# Patient Record
Sex: Female | Born: 1980 | Race: Black or African American | Hispanic: No | Marital: Married | Smoking: Never smoker
Health system: Southern US, Community
[De-identification: ages and names within clinical notes are randomized; demographics above are authoritative.]

## PROBLEM LIST (undated history)

## (undated) ENCOUNTER — Inpatient Hospital Stay: Payer: Self-pay

## (undated) DIAGNOSIS — M797 Fibromyalgia: Secondary | ICD-10-CM

## (undated) DIAGNOSIS — J4520 Mild intermittent asthma, uncomplicated: Secondary | ICD-10-CM

## (undated) DIAGNOSIS — R030 Elevated blood-pressure reading, without diagnosis of hypertension: Secondary | ICD-10-CM

## (undated) DIAGNOSIS — O09529 Supervision of elderly multigravida, unspecified trimester: Secondary | ICD-10-CM

## (undated) DIAGNOSIS — G8929 Other chronic pain: Secondary | ICD-10-CM

## (undated) DIAGNOSIS — O009 Unspecified ectopic pregnancy without intrauterine pregnancy: Secondary | ICD-10-CM

## (undated) DIAGNOSIS — E559 Vitamin D deficiency, unspecified: Secondary | ICD-10-CM

## (undated) DIAGNOSIS — M1990 Unspecified osteoarthritis, unspecified site: Secondary | ICD-10-CM

## (undated) DIAGNOSIS — R7303 Prediabetes: Secondary | ICD-10-CM

## (undated) HISTORY — DX: Vitamin D deficiency, unspecified: E55.9

## (undated) HISTORY — DX: Unspecified ectopic pregnancy without intrauterine pregnancy: O00.90

## (undated) HISTORY — PX: ECTOPIC PREGNANCY SURGERY: SHX613

---

## 2006-05-08 DIAGNOSIS — O24419 Gestational diabetes mellitus in pregnancy, unspecified control: Secondary | ICD-10-CM

## 2012-06-11 ENCOUNTER — Emergency Department: Payer: Self-pay | Admitting: Emergency Medicine

## 2012-06-11 LAB — COMPREHENSIVE METABOLIC PANEL
Albumin: 3.5 g/dL (ref 3.4–5.0)
Alkaline Phosphatase: 70 U/L (ref 50–136)
BUN: 9 mg/dL (ref 7–18)
Calcium, Total: 8.2 mg/dL — ABNORMAL LOW (ref 8.5–10.1)
Co2: 27 mmol/L (ref 21–32)
Creatinine: 0.94 mg/dL (ref 0.60–1.30)
Osmolality: 276 (ref 275–301)
Potassium: 3.7 mmol/L (ref 3.5–5.1)
SGOT(AST): 20 U/L (ref 15–37)
SGPT (ALT): 20 U/L (ref 12–78)
Sodium: 139 mmol/L (ref 136–145)

## 2012-06-11 LAB — AMYLASE: Amylase: 84 U/L (ref 25–115)

## 2012-06-11 LAB — CBC
HCT: 40.2 % (ref 35.0–47.0)
HGB: 13.7 g/dL (ref 12.0–16.0)
MCH: 29.8 pg (ref 26.0–34.0)
MCHC: 34.2 g/dL (ref 32.0–36.0)
MCV: 87 fL (ref 80–100)

## 2012-06-11 LAB — URINALYSIS, COMPLETE
Bacteria: NONE SEEN
Bilirubin,UR: NEGATIVE
Ketone: NEGATIVE
Ph: 6 (ref 4.5–8.0)
Protein: NEGATIVE
Specific Gravity: 1.027 (ref 1.003–1.030)
Squamous Epithelial: 4

## 2012-06-11 LAB — LIPASE, BLOOD: Lipase: 190 U/L (ref 73–393)

## 2012-06-11 LAB — PREGNANCY, URINE: Pregnancy Test, Urine: NEGATIVE m[IU]/mL

## 2012-06-11 IMAGING — CT CT ABD-PELV W/ CM
1 of 2 series · 15 of 32 positions shown, 19 images · non-contrast
Comparison: none

REASON FOR EXAM: (1) upper pain and constipation; (2) pain
COMMENTS:   May transport without cardiac monitor

PROCEDURE:     CT  - CT ABDOMEN / PELVIS  W  - [DATE]  [DATE]
RESULT:     History: Pain.
Comparison Study: No prior.

[Series 2: 3mm soft tissue · axial · 0.71mm/px · z∈[-981,-519]mm · 15 of 168 slices shown, 19 images]
[im 7/168  soft-tissue]
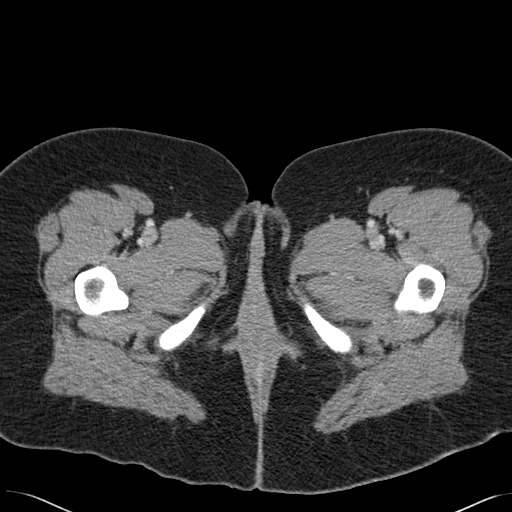
[im 7/168  bone]
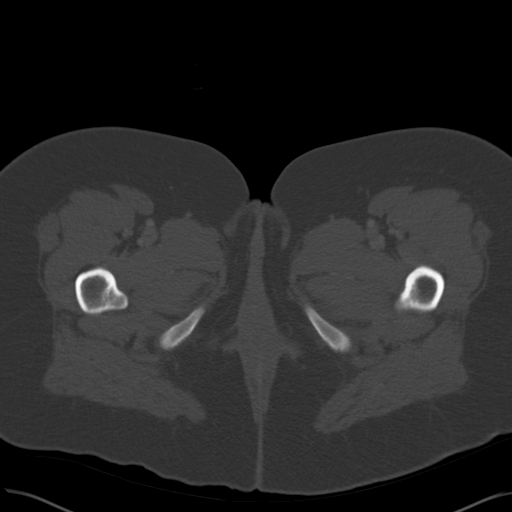
[im 20/168  soft-tissue]
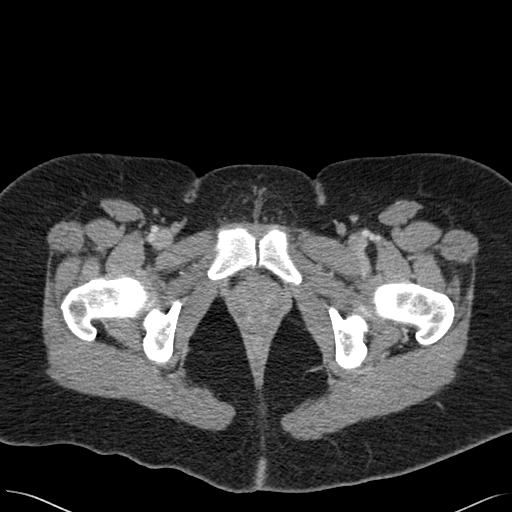
[im 33/168  soft-tissue]
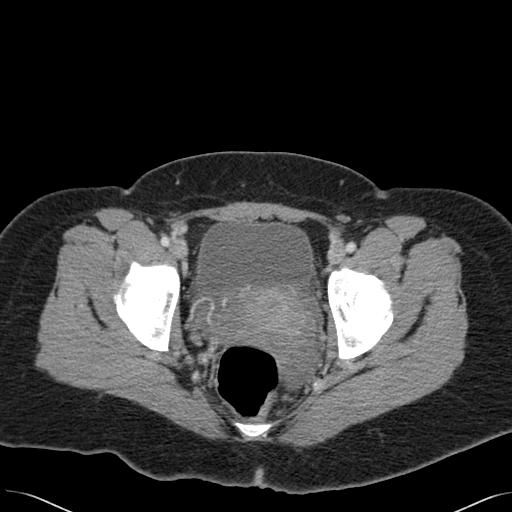
[im 45/168  soft-tissue]
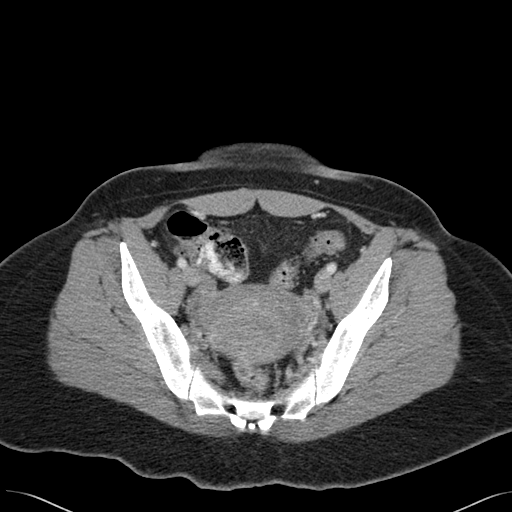
[im 58/168  soft-tissue]
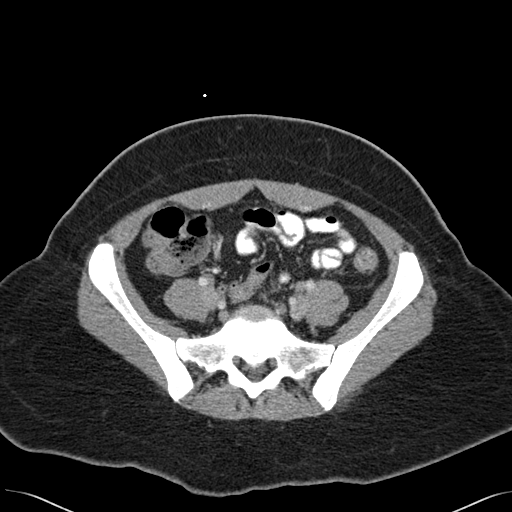
[im 71/168  soft-tissue]
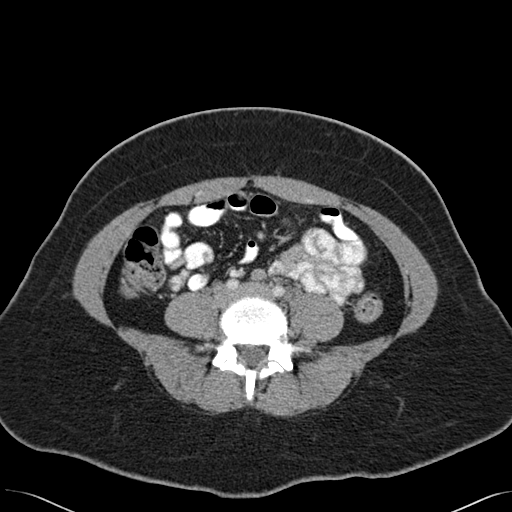
[im 84/168  soft-tissue]
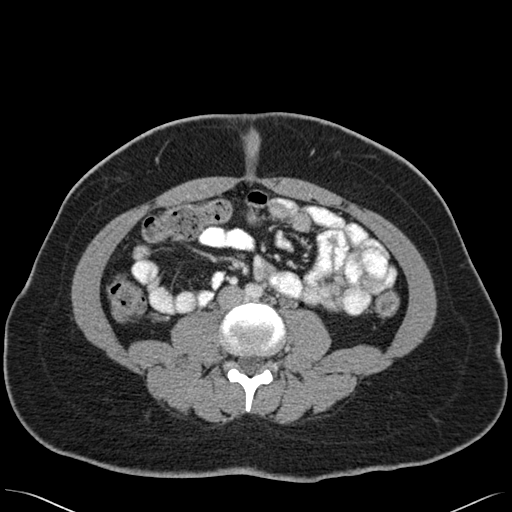
[im 97/168  soft-tissue]
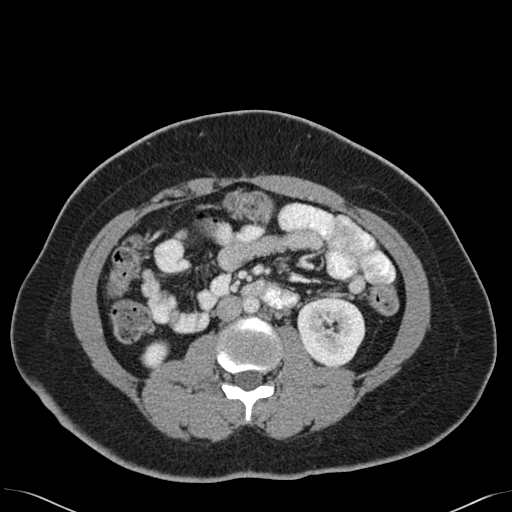
[im 110/168  soft-tissue]
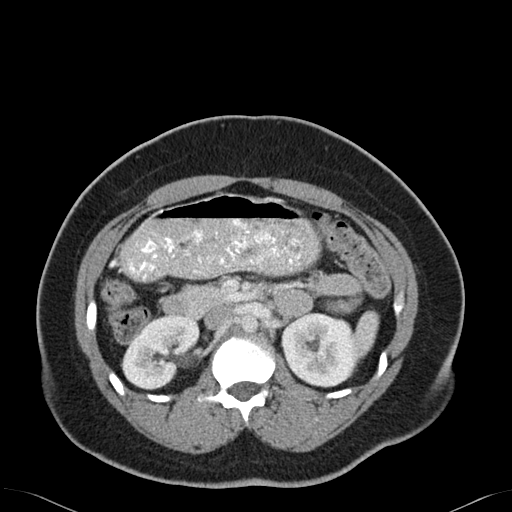
[im 110/168  bone]
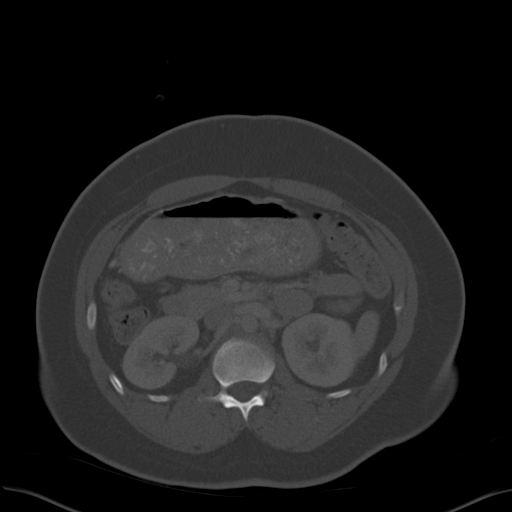
[im 123/168  soft-tissue]
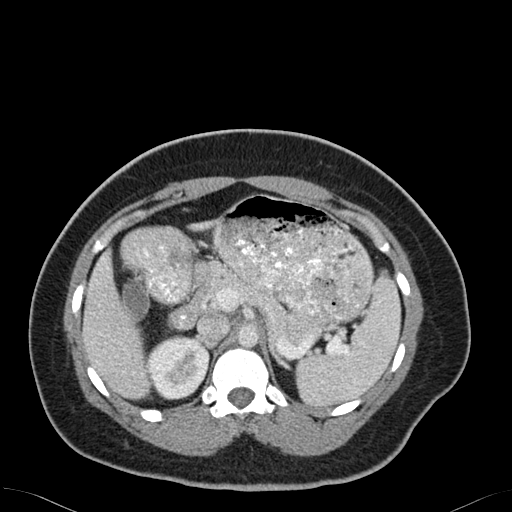
[im 135/168  soft-tissue]
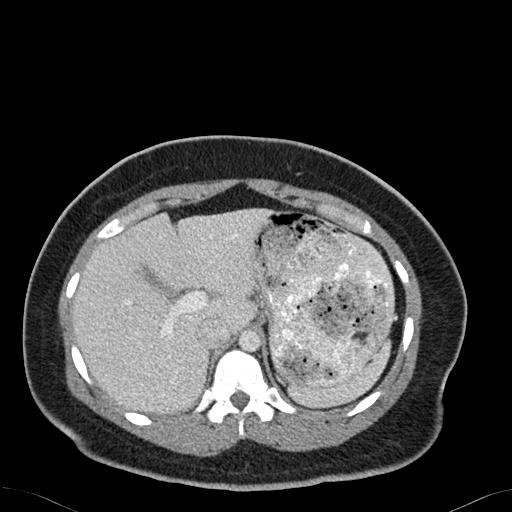
[im 142/168  lung]
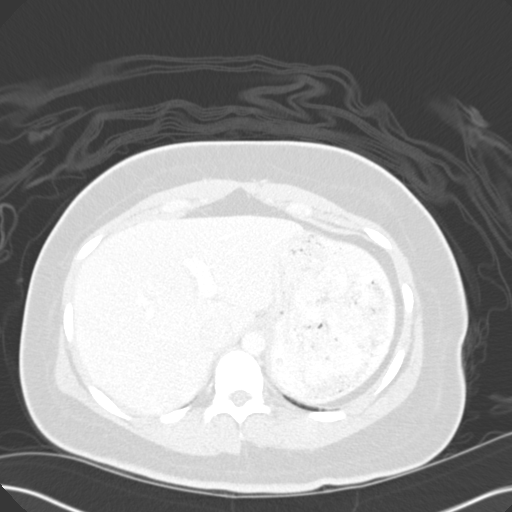
[im 148/168  soft-tissue]
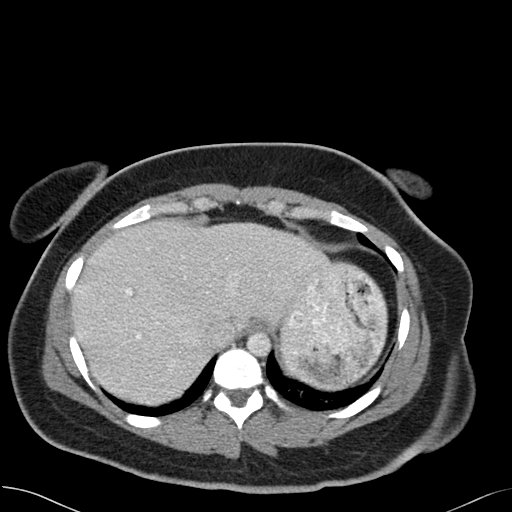
[im 148/168  lung]
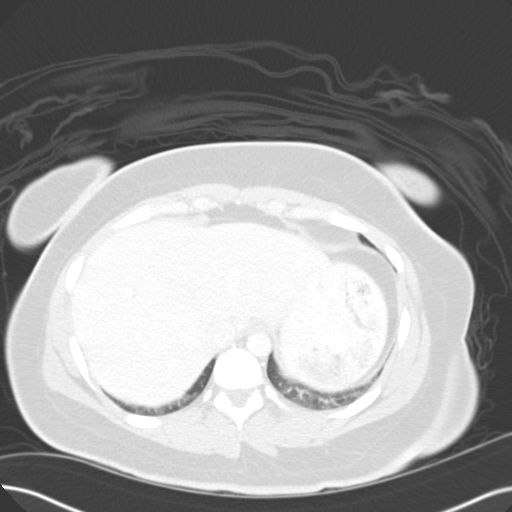
[im 155/168  lung]
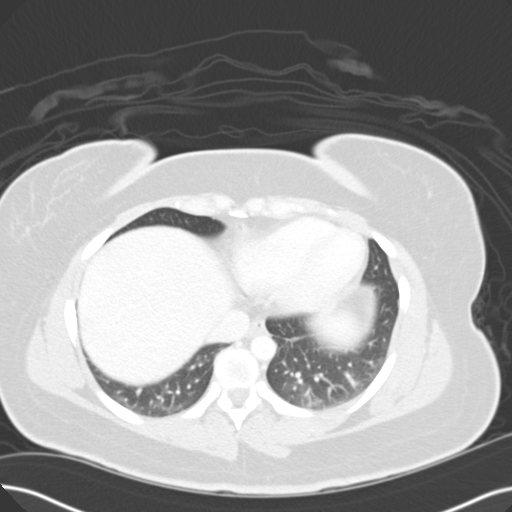
[im 161/168  soft-tissue]
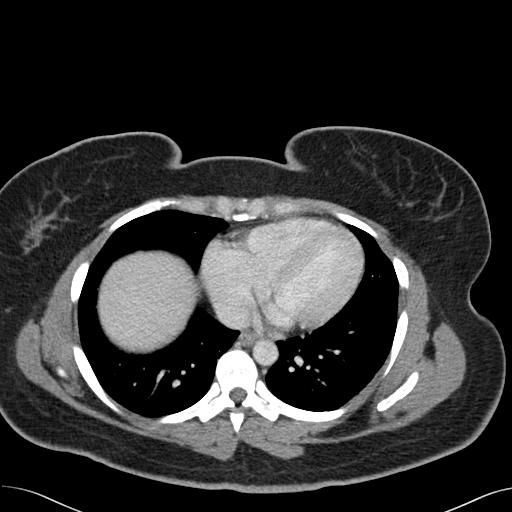
[im 161/168  lung]
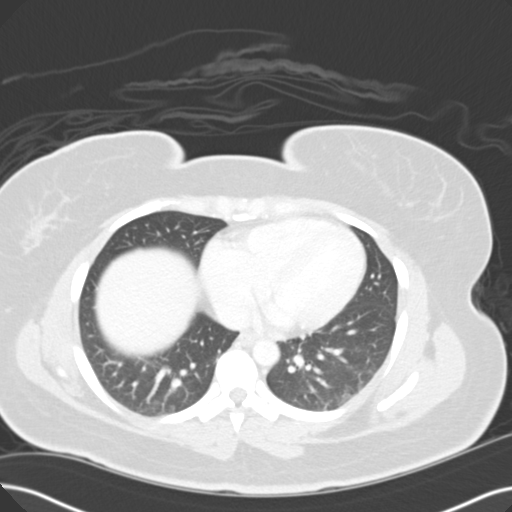

[15 of 32 positions shown; findings below may reference images not displayed]

FINDINGS: Standard CT obtained with 100 cc of [12]. Liver normal.
Spleen normal. Pancreas normal. Adrenals normal. Kidneys normal. Aorta
normal caliber. Appendix normal. Bladder is nondistended. No hydronephrosis.
Debris is noted in the stomach. The stomach is slightly distended.  No bowel
obstruction or free air. Mesenteric vessels and portal vein are patent.
Appendix normal. Small amount of free pelvic fluid noted. Left adnexal
cystic structure with surrounding mild enhancement present. Pelvic
ultrasound suggested for further evaluation. Pregnancy test suggested to
exclude ectopic pregnancy.
IMPRESSION: Free pelvic fluid with left adnexal cystic lesion. Pelvic
ultrasound suggested for further evaluation. Pregnancy test to exclude
ectopic pregnancy suggested. Gastric distention. Debris  present in the
stomach.

## 2013-08-20 ENCOUNTER — Emergency Department: Payer: Self-pay | Admitting: Emergency Medicine

## 2013-08-24 DIAGNOSIS — R8290 Unspecified abnormal findings in urine: Secondary | ICD-10-CM | POA: Insufficient documentation

## 2013-09-04 ENCOUNTER — Observation Stay: Payer: Self-pay | Admitting: Obstetrics and Gynecology

## 2013-09-04 LAB — URINALYSIS, COMPLETE
Bacteria: NONE SEEN
Bilirubin,UR: NEGATIVE
Blood: NEGATIVE
Glucose,UR: NEGATIVE mg/dL
Ketone: NEGATIVE
Leukocyte Esterase: NEGATIVE
Nitrite: NEGATIVE
Ph: 5
Protein: NEGATIVE
RBC,UR: 1 /HPF
Specific Gravity: 1.018
Squamous Epithelial: 2
WBC UR: 2 /HPF

## 2014-03-06 ENCOUNTER — Emergency Department: Payer: Self-pay | Admitting: Emergency Medicine

## 2014-03-18 DIAGNOSIS — G5600 Carpal tunnel syndrome, unspecified upper limb: Secondary | ICD-10-CM | POA: Insufficient documentation
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
## 2015-11-22 ENCOUNTER — Encounter: Payer: Self-pay | Admitting: *Deleted

## 2015-11-22 ENCOUNTER — Emergency Department: Payer: Self-pay

## 2015-11-22 ENCOUNTER — Emergency Department
Admission: EM | Admit: 2015-11-22 | Discharge: 2015-11-22 | Disposition: A | Discharge status: Home / Self Care | Payer: Self-pay | Attending: Emergency Medicine | Admitting: Emergency Medicine

## 2015-11-22 DIAGNOSIS — S2020XA Contusion of thorax, unspecified, initial encounter: Secondary | ICD-10-CM | POA: Insufficient documentation

## 2015-11-22 DIAGNOSIS — Y939 Activity, unspecified: Secondary | ICD-10-CM | POA: Insufficient documentation

## 2015-11-22 DIAGNOSIS — S20212A Contusion of left front wall of thorax, initial encounter: Secondary | ICD-10-CM

## 2015-11-22 DIAGNOSIS — Y929 Unspecified place or not applicable: Secondary | ICD-10-CM | POA: Insufficient documentation

## 2015-11-22 DIAGNOSIS — Y999 Unspecified external cause status: Secondary | ICD-10-CM | POA: Insufficient documentation

## 2015-11-22 DIAGNOSIS — W010XXA Fall on same level from slipping, tripping and stumbling without subsequent striking against object, initial encounter: Secondary | ICD-10-CM | POA: Insufficient documentation

## 2015-11-22 IMAGING — CR DG RIBS W/ CHEST 3+V*L*
3 series · 3 of 3 positions shown · non-contrast
Comparison: None.

CLINICAL DATA: Fall, left lateral chest pain

EXAM:
LEFT RIBS AND CHEST - 3+ VIEW

[chest pa]
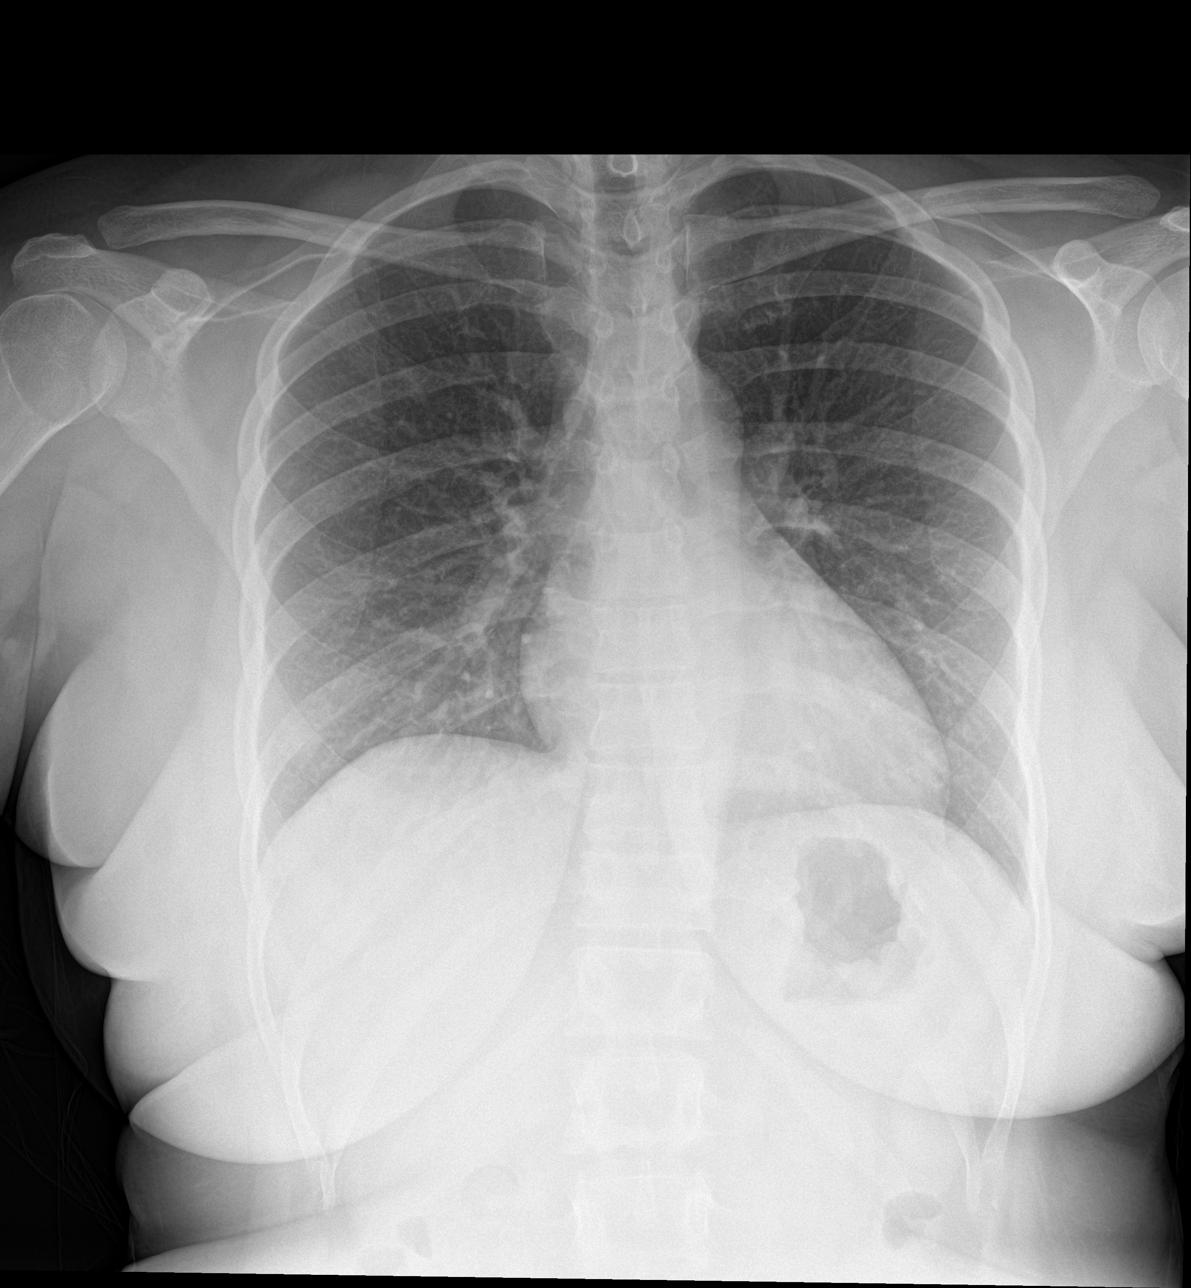

[rib pa]
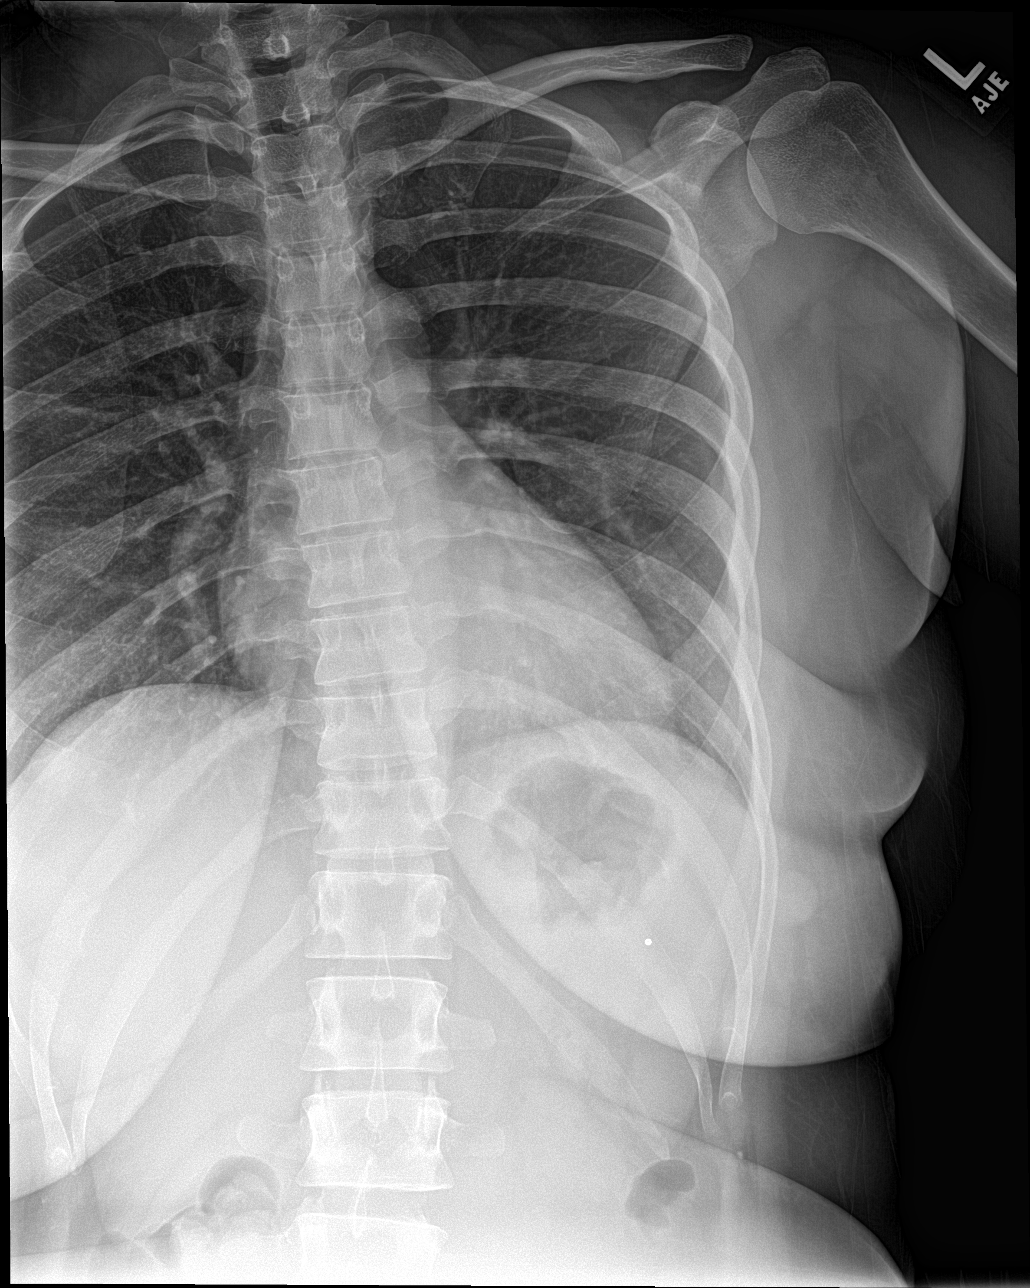

[rib ap obl]
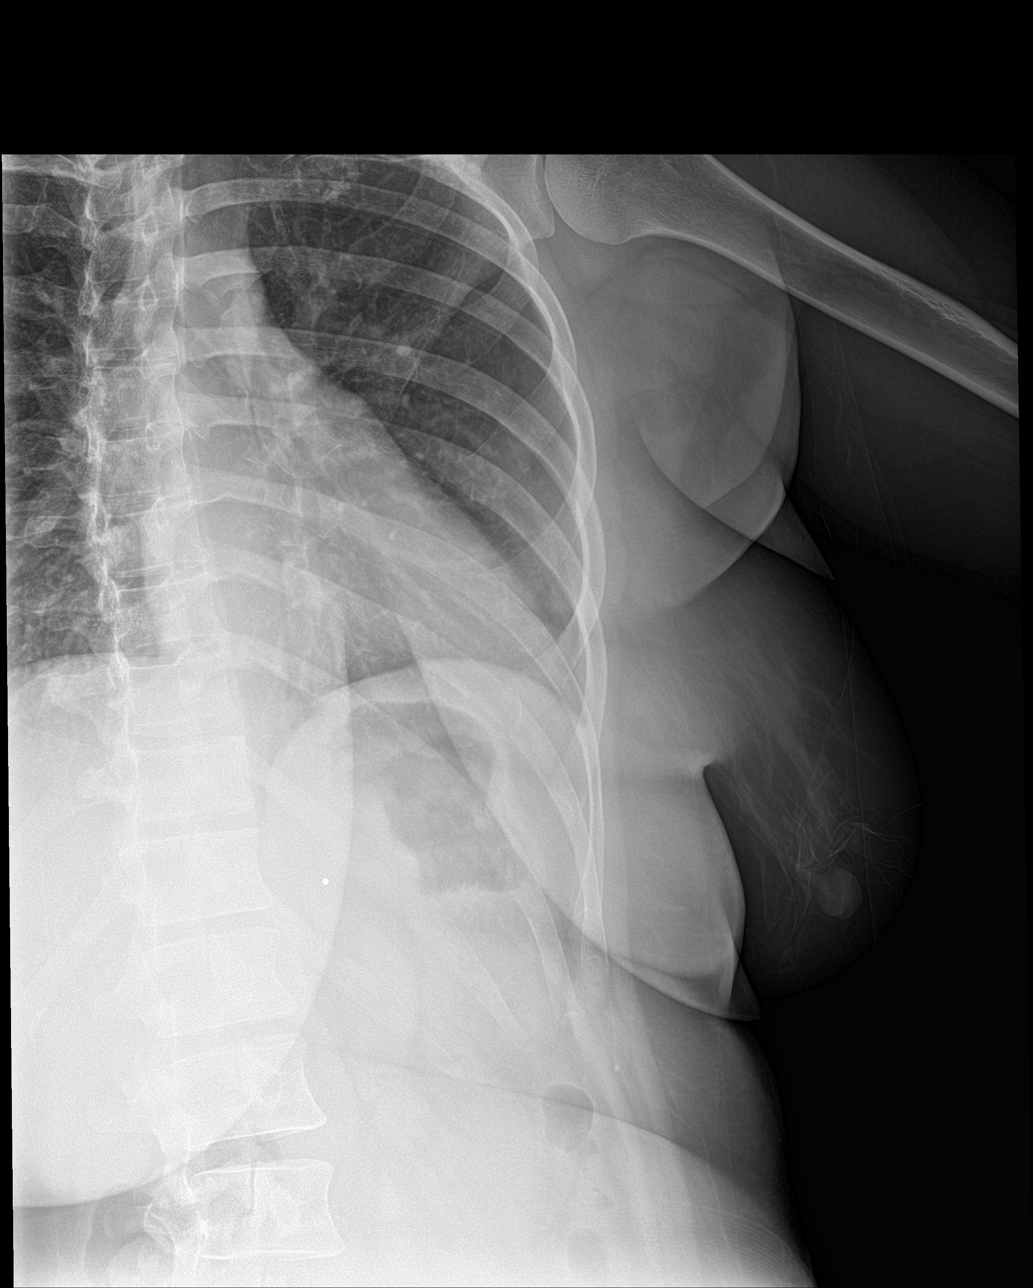

[3 of 3 positions shown; findings below may reference images not displayed]

FINDINGS: Lungs are clear.  No pleural effusion or pneumothorax.

The heart is normal in size.

No displaced left rib fracture is seen.
IMPRESSION: No evidence of acute cardiopulmonary disease.

No displaced left rib fracture is seen.

## 2015-11-22 MED ORDER — MELOXICAM 7.5 MG PO TABS
15.0000 mg | ORAL_TABLET | Freq: Once | ORAL | Status: AC
  Administered 2015-11-22: 15 mg via ORAL
  Filled 2015-11-22: qty 2

## 2015-11-22 MED ORDER — MELOXICAM 15 MG PO TABS: 15.0000 mg | ORAL_TABLET | Freq: Every day | ORAL | Status: DC

## 2015-11-22 NOTE — Discharge Instructions (Signed)
Blunt Chest Trauma °Blunt chest trauma is an injury caused by a blow to the chest. These chest injuries can be very painful. Blunt chest trauma often results in bruised or broken (fractured) ribs. Most cases of bruised and fractured ribs from blunt chest traumas get better after 1 to 3 weeks of rest and pain medicine. Often, the soft tissue in the chest wall is also injured, causing pain and bruising. Internal organs, such as the heart and lungs, may also be injured. Blunt chest trauma can lead to serious medical problems. This injury requires immediate medical care. °CAUSES  °· Motor vehicle collisions. °· Falls. °· Physical violence. °· Sports injuries. °SYMPTOMS  °· Chest pain. The pain may be worse when you move or breathe deeply. °· Shortness of breath. °· Lightheadedness. °· Bruising. °· Tenderness. °· Swelling. °DIAGNOSIS  °Your caregiver will do a physical exam. X-rays may be taken to look for fractures. However, minor rib fractures may not show up on X-rays until a few days after the injury. If a more serious injury is suspected, further imaging tests may be done. This may include ultrasounds, computed tomography (CT) scans, or magnetic resonance imaging (MRI). °TREATMENT  °Treatment depends on the severity of your injury. Your caregiver may prescribe pain medicines and deep breathing exercises. °HOME CARE INSTRUCTIONS °· Limit your activities until you can move around without much pain. °· Do not do any strenuous work until your injury is healed. °· Put ice on the injured area. °· Put ice in a plastic bag. °· Place a towel between your skin and the bag. °· Leave the ice on for 15-20 minutes, 03-04 times a day. °· You may wear a rib belt as directed by your caregiver to reduce pain. °· Practice deep breathing as directed by your caregiver to keep your lungs clear. °· Only take over-the-counter or prescription medicines for pain, fever, or discomfort as directed by your caregiver. °SEEK IMMEDIATE MEDICAL  CARE IF:  °· You have increasing pain or shortness of breath. °· You cough up blood. °· You have nausea, vomiting, or abdominal pain. °· You have a fever. °· You feel dizzy, weak, or you faint. °MAKE SURE YOU: °· Understand these instructions. °· Will watch your condition. °· Will get help right away if you are not doing well or get worse. °  °This information is not intended to replace advice given to you by your health care provider. Make sure you discuss any questions you have with your health care provider. °  °Document Released: 08/15/2004 Document Revised: 07/29/2014 Document Reviewed: 01/04/2015 °Elsevier Interactive Patient Education ©2016 [employer] ° °Rib Contusion °A rib contusion is a deep bruise on your rib area. Contusions are the result of a blunt trauma that causes bleeding and injury to the tissues under the skin. A rib contusion may involve bruising of the ribs and of the skin and muscles in the area. The skin overlying the contusion may turn blue, purple, or yellow. Minor injuries will give you a painless contusion, but more severe contusions may stay painful and swollen for a few weeks. °CAUSES  °A contusion is usually caused by a blow, trauma, or direct force to an area of the body. This often occurs while playing contact sports. °SYMPTOMS °· Swelling and redness of the injured area. °· Discoloration of the injured area. °· Tenderness and soreness of the injured area. °· Pain with or without movement. °DIAGNOSIS  °The diagnosis can be made by taking a medical history and   performing a physical exam. An X-ray, CT scan, or MRI may be needed to determine if there were any associated injuries, such as broken bones (fractures) or internal injuries. °TREATMENT  °Often, the best treatment for a rib contusion is rest. Icing or applying cold compresses to the injured area may help reduce swelling and inflammation. Deep breathing exercises may be recommended to reduce the risk of partial lung collapse  and pneumonia. Over-the-counter or prescription medicines may also be recommended for pain control. °HOME CARE INSTRUCTIONS  °· Apply ice to the injured area: °¨ Put ice in a plastic bag. °¨ Place a towel between your skin and the bag. °¨ Leave the ice on for 20 minutes, 2-3 times per day. °· Take medicines only as directed by your health care provider. °· Rest the injured area. Avoid strenuous activity and any activities or movements that cause pain. Be careful during activities and avoid bumping the injured area. °· Perform deep-breathing exercises as directed by your health care provider. °· Do not lift anything that is heavier than 5 lb (2.3 kg) until your health care provider approves. °· Do not use any tobacco products, including cigarettes, chewing tobacco, or electronic cigarettes. If you need help quitting, ask your health care provider. °SEEK MEDICAL CARE IF:  °· You have increased bruising or swelling. °· You have pain that is not controlled with treatment. °· You have a fever. °SEEK IMMEDIATE MEDICAL CARE IF:  °· You have difficulty breathing or shortness of breath. °· You develop a continual cough, or you cough up thick or bloody sputum. °· You feel sick to your stomach (nauseous), you throw up (vomit), or you have abdominal pain. °  °This information is not intended to replace advice given to you by your health care provider. Make sure you discuss any questions you have with your health care provider. °  °Document Released: 04/02/2001 Document Revised: 07/29/2014 Document Reviewed: 04/19/2014 °Elsevier Interactive Patient Education ©2016 Elsevier Inc. ° °

## 2015-11-22 NOTE — ED Notes (Signed)
States she fell on Monday on her left side and now has left sided back pain and side pain, states pain increases when she moves a certin way

## 2015-11-22 NOTE — ED Notes (Signed)
See triage note   States she slipped and fell on Monday  Developed pain to left mid back/rib area on Tuesday  Pain increases with movement

## 2015-11-22 NOTE — ED Provider Notes (Signed)
[facility] Emergency Department Provider Note  ____________________________________________  Time seen: Approximately 3:51 PM  I have reviewed the triage vital signs and the nursing notes.   HISTORY  Chief Complaint Fall    HPI Teresa Meza is a 35 y.o. female who presents to emergency complaining of left-sided rib pain. Patient states that she fell 2 days prior and landed on her left ribs. Initially patient felt she just bruised herself but states that the pain has become worse over the intervening period. Patient states that it is a burning/knifelike sensation to the left lateral rib cage. She denies any shortness of breath but does report pain with deep inspiration. Patient denies hitting her head or losing consciousness. She denies any neck or back pain. Patient denies any paresthesias. She has been taking ibuprofen for symptom relief.   History reviewed. No pertinent past medical history.  There are no active problems to display for this patient.   No past surgical history on file.  Current Outpatient Rx  Name  Route  Sig  Dispense  Refill  . meloxicam (MOBIC) 15 MG tablet   Oral   Take 1 tablet (15 mg total) by mouth daily.   30 tablet   0     Allergies Sulfa antibiotics  History reviewed. No pertinent family history.  Social History Social History  Substance Use Topics  . Smoking status: None  . Smokeless tobacco: None  . Alcohol Use: None     Review of Systems  Constitutional: No fever/chills Eyes: No visual changes. No discharge ENT: No upper respiratory complaints. Cardiovascular: no chest pain. Respiratory: no cough. No SOB. Musculoskeletal: Positive for left-sided rib pain. Skin: Negative for rash, abrasions, lacerations, ecchymosis. Neurological: Negative for headaches, focal weakness or numbness. 10-point ROS otherwise negative.  ____________________________________________   PHYSICAL EXAM:  VITAL  SIGNS: ED Triage Vitals  Enc Vitals Group     BP 11/22/15 1535 127/61 mmHg     Pulse Rate 11/22/15 1535 84     Resp 11/22/15 1535 20     Temp 11/22/15 1535 98.2 F (36.8 C)     Temp Source 11/22/15 1535 Oral     SpO2 11/22/15 1535 100 %     Weight 11/22/15 1535 210 lb (95.255 kg)     Height 11/22/15 1535 5\' 3"  (1.6 m)     Head Cir --      Peak Flow --      Pain Score 11/22/15 1538 10     Pain Loc --      Pain Edu? --      Excl. in GC? --      Constitutional: Alert and oriented. Well appearing and in no acute distress. Eyes: Conjunctivae are normal. PERRL. EOMI. Head: Atraumatic. ENT:      Ears:       Nose: No congestion/rhinnorhea.      Mouth/Throat: Mucous membranes are moist.  Neck: No stridor.  No cervical spine tenderness to palpation  Cardiovascular: Normal rate, regular rhythm. Normal S1 and S2.  Good peripheral circulation. Respiratory: Normal respiratory effort without tachypnea or retractions. Lungs CTAB. Good air entry to the bases with no decreased or absent breath sounds. Musculoskeletal: Full range of motion to all extremities. No gross deformities appreciated. No deformities to the rib cage but inspection. Patient is tender palpation over the left lateral ribs in the fifth through eighth rib group. No palpable abnormality. No flail chest segment. No paradoxical chest wall movement. Neurologic:  Normal speech and  language. No gross focal neurologic deficits are appreciated.  Skin:  Skin is warm, dry and intact. No rash noted. Psychiatric: Mood and affect are normal. Speech and behavior are normal. Patient exhibits appropriate insight and judgement.   ____________________________________________   LABS (all labs ordered are listed, but only abnormal results are displayed)  Labs Reviewed - No data to display ____________________________________________  EKG   ____________________________________________  RADIOLOGY Festus Barren Cuthriell, personally  viewed and evaluated these images (plain radiographs) as part of my medical decision making, as well as reviewing the written report by the radiologist.  Dg Ribs Unilateral W/chest Left  11/22/2015  CLINICAL DATA:  Fall, left lateral chest pain EXAM: LEFT RIBS AND CHEST - 3+ VIEW COMPARISON:  None. FINDINGS: Lungs are clear.  No pleural effusion or pneumothorax. The heart is normal in size. No displaced left rib fracture is seen. IMPRESSION: No evidence of acute cardiopulmonary disease. No displaced left rib fracture is seen. Electronically Signed   By: Charline Bills M.D.   On: 11/22/2015 16:49    ____________________________________________    PROCEDURES  Procedure(s) performed:       Medications  meloxicam (MOBIC) tablet 15 mg (not administered)     ____________________________________________   INITIAL IMPRESSION / ASSESSMENT AND PLAN / ED COURSE  Pertinent labs & imaging results that were available during my care of the patient were reviewed by me and considered in my medical decision making (see chart for details).  Patient's diagnosis is consistent with Left chest wall contusion. X-ray reveals no acute osseous abnormality. Exam is reassuring.. Patient will be discharged home with prescriptions for anti-inflammatories for symptom control. Patient is to follow up with primary care provider as needed or otherwise directed. Patient is given ED precautions to return to the ED for any worsening or new symptoms.     ____________________________________________  FINAL CLINICAL IMPRESSION(S) / ED DIAGNOSES  Final diagnoses:  Chest wall contusion, left, initial encounter      NEW MEDICATIONS STARTED DURING THIS VISIT:  New Prescriptions   MELOXICAM (MOBIC) 15 MG TABLET    Take 1 tablet (15 mg total) by mouth daily.        This chart was dictated using voice recognition software/Dragon. Despite best efforts to proofread, errors can occur which can change the  meaning. Any change was purely unintentional.    Racheal Patches, PA-C 11/22/15 1723  Sharman Cheek, MD 11/23/15 [phone]

## 2016-01-29 ENCOUNTER — Emergency Department
Admission: EM | Admit: 2016-01-29 | Discharge: 2016-01-29 | Disposition: A | Discharge status: Home / Self Care | Payer: Self-pay | Attending: Emergency Medicine | Admitting: Emergency Medicine

## 2016-01-29 ENCOUNTER — Encounter: Payer: Self-pay | Admitting: Emergency Medicine

## 2016-01-29 ENCOUNTER — Emergency Department: Payer: Self-pay

## 2016-01-29 DIAGNOSIS — J302 Other seasonal allergic rhinitis: Secondary | ICD-10-CM

## 2016-01-29 DIAGNOSIS — N390 Urinary tract infection, site not specified: Secondary | ICD-10-CM

## 2016-01-29 DIAGNOSIS — Z79899 Other long term (current) drug therapy: Secondary | ICD-10-CM | POA: Insufficient documentation

## 2016-01-29 LAB — COMPREHENSIVE METABOLIC PANEL
ALT: 12 U/L — AB (ref 14–54)
AST: 21 U/L (ref 15–41)
Albumin: 3.6 g/dL (ref 3.5–5.0)
Alkaline Phosphatase: 51 U/L (ref 38–126)
Anion gap: 8 (ref 5–15)
BUN: 17 mg/dL (ref 6–20)
CHLORIDE: 105 mmol/L (ref 101–111)
CO2: 23 mmol/L (ref 22–32)
CREATININE: 0.99 mg/dL (ref 0.44–1.00)
Calcium: 8.8 mg/dL — ABNORMAL LOW (ref 8.9–10.3)
Glucose, Bld: 155 mg/dL — ABNORMAL HIGH (ref 65–99)
POTASSIUM: 4.1 mmol/L (ref 3.5–5.1)
SODIUM: 136 mmol/L (ref 135–145)
Total Bilirubin: 0.5 mg/dL (ref 0.3–1.2)
Total Protein: 7.5 g/dL (ref 6.5–8.1)

## 2016-01-29 LAB — URINALYSIS COMPLETE WITH MICROSCOPIC (ARMC ONLY)
BACTERIA UA: NONE SEEN
Bilirubin Urine: NEGATIVE
Glucose, UA: NEGATIVE mg/dL
Ketones, ur: NEGATIVE mg/dL
Nitrite: NEGATIVE
PH: 5 (ref 5.0–8.0)
PROTEIN: 30 mg/dL — AB
Specific Gravity, Urine: 1.025 (ref 1.005–1.030)

## 2016-01-29 LAB — CBC
HEMATOCRIT: 40.4 % (ref 35.0–47.0)
Hemoglobin: 13.9 g/dL (ref 12.0–16.0)
MCH: 28.7 pg (ref 26.0–34.0)
MCHC: 34.3 g/dL (ref 32.0–36.0)
MCV: 83.7 fL (ref 80.0–100.0)
Platelets: 229 10*3/uL (ref 150–440)
RBC: 4.82 MIL/uL (ref 3.80–5.20)
RDW: 12.9 % (ref 11.5–14.5)
WBC: 6.4 10*3/uL (ref 3.6–11.0)

## 2016-01-29 LAB — POCT PREGNANCY, URINE: PREG TEST UR: NEGATIVE

## 2016-01-29 IMAGING — CT CT RENAL STONE PROTOCOL
3 of 4 series · 7 of 46 positions shown, 13 images · non-contrast
Comparison: [DATE]

CLINICAL DATA: Left-sided flank pain with hematuria

EXAM:
CT ABDOMEN AND PELVIS WITHOUT CONTRAST
TECHNIQUE: Multidetector CT imaging of the abdomen and pelvis was performed
following the standard protocol without oral or intravenous contrast
material administration.

[Series 4: lung · axial · 0.69mm/px · z∈[+88,+132]mm · 3 of 19 slices shown, 7 images]
[im 5/19  soft-tissue]
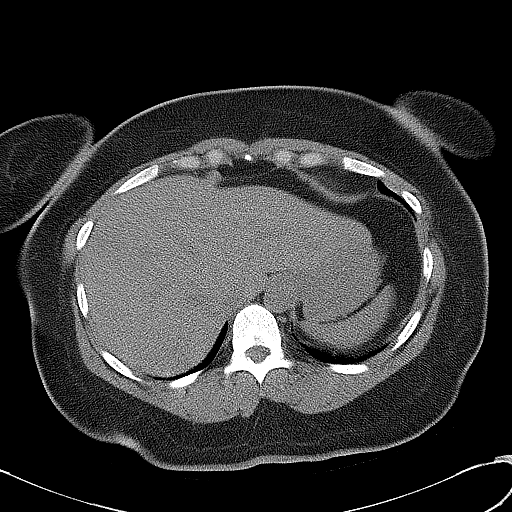
[im 5/19  lung]
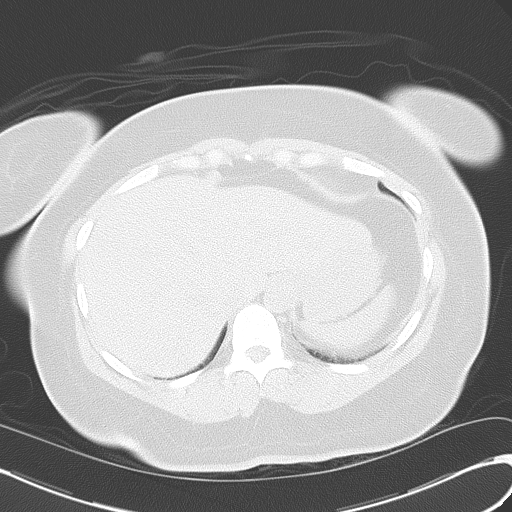
[im 5/19  bone]
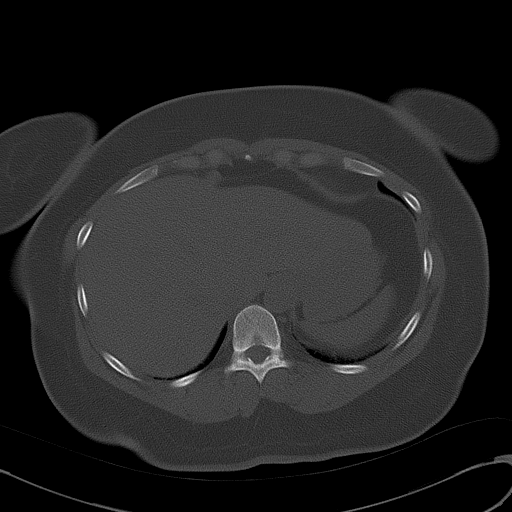
[im 10/19  soft-tissue]
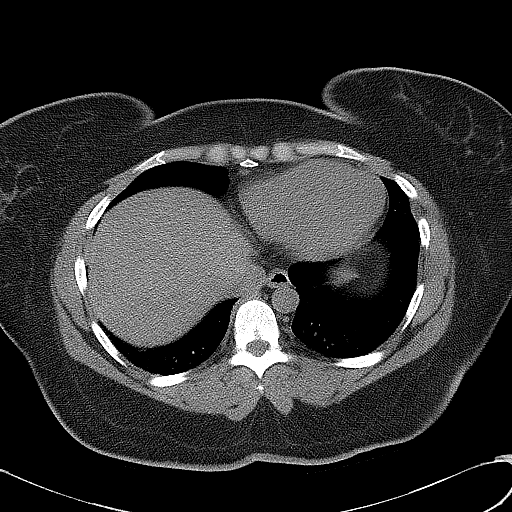
[im 10/19  lung]
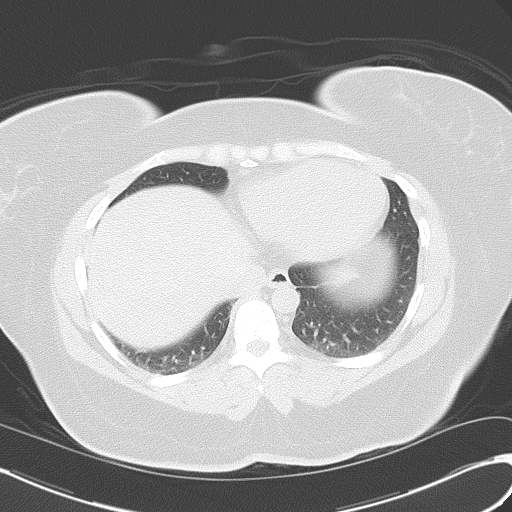
[im 14/19  soft-tissue]
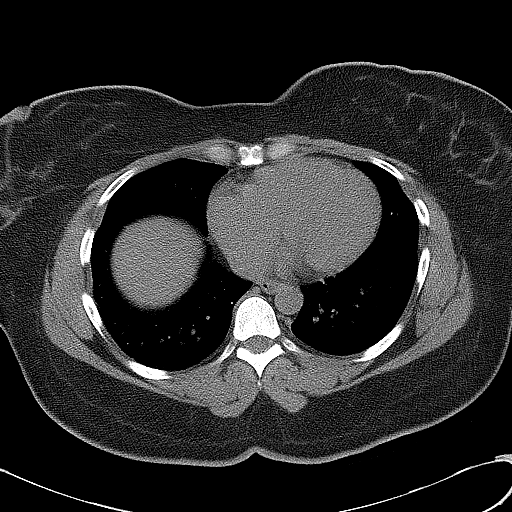
[im 14/19  lung]
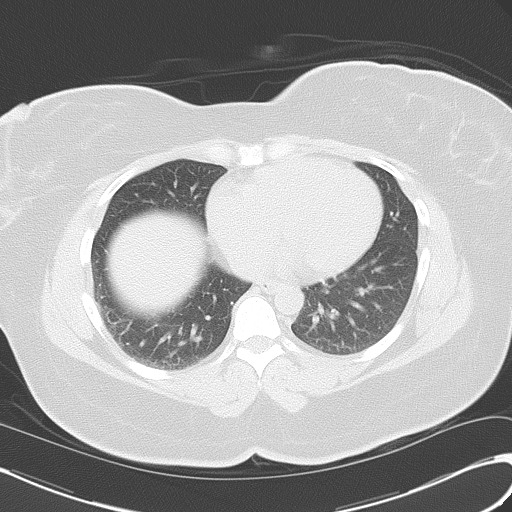

[Series 5: coronal · coronal · 0.74mm/px · 3 of 125 slices shown, 4 images]
[im 42/125  soft-tissue]
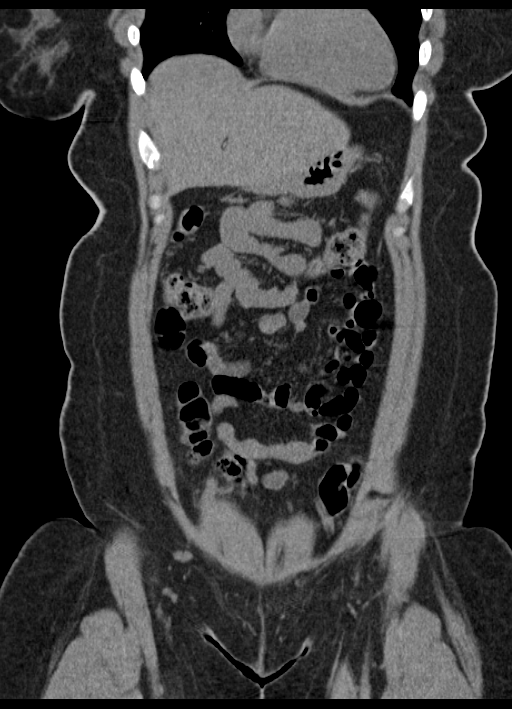
[im 56/125  soft-tissue]
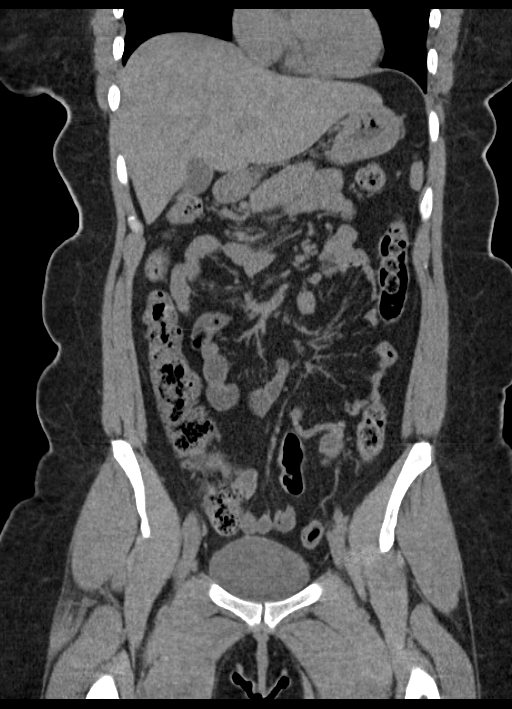
[im 56/125  bone]
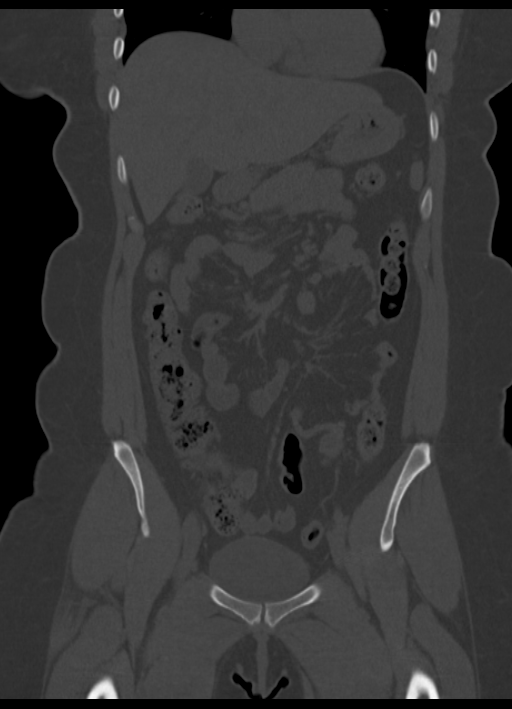
[im 69/125  soft-tissue]
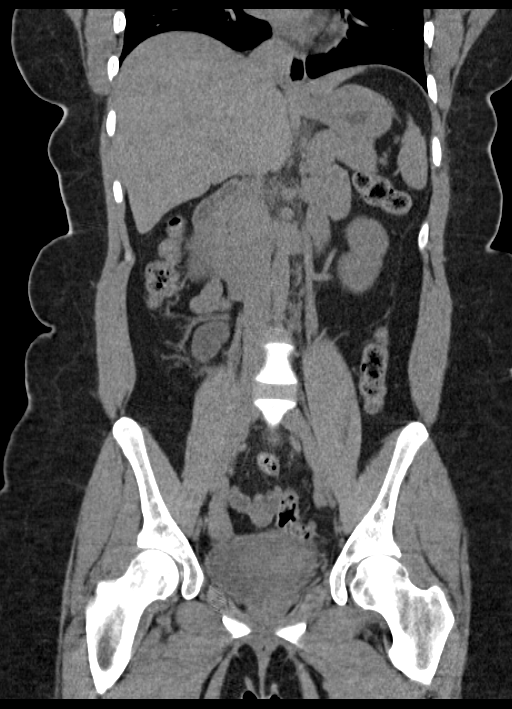

[Series 6: sagittal · sagittal · 0.66mm/px · 1 of 176 slices shown, 2 images]
[im 59/176  soft-tissue]
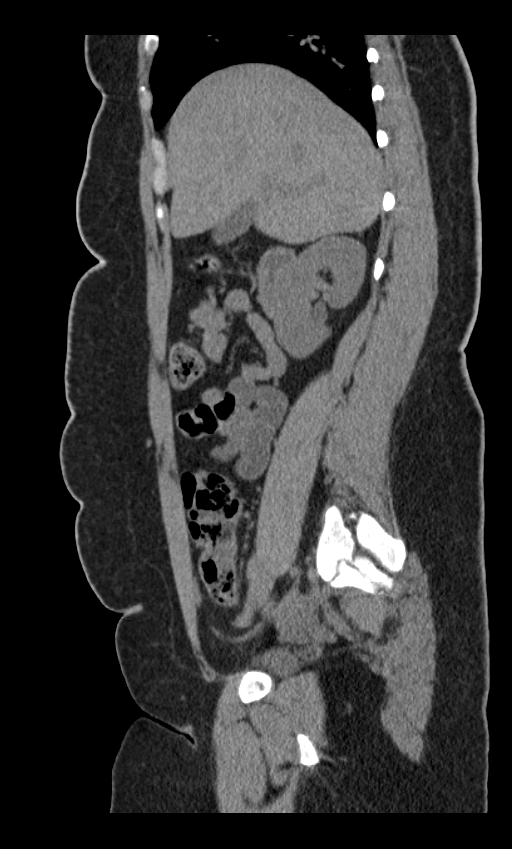
[im 59/176  bone]
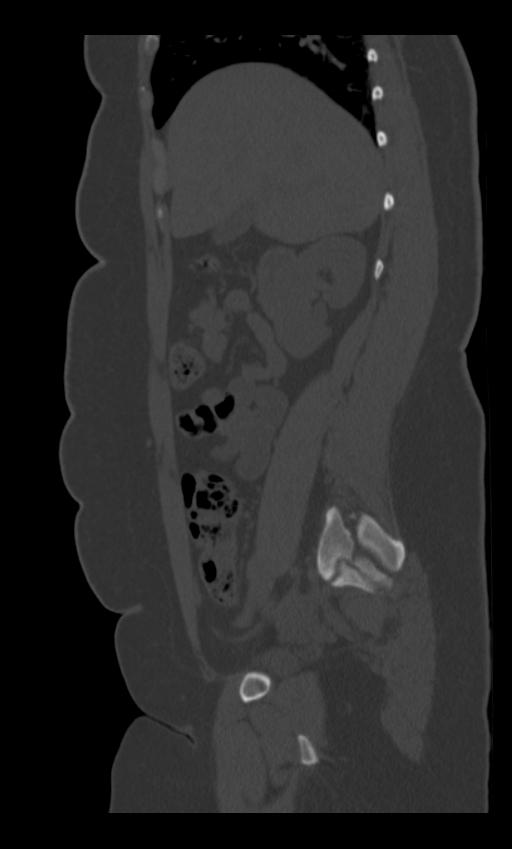

[7 of 46 positions shown; findings below may reference images not displayed]

FINDINGS: Lower chest:  Lung bases are clear.

Hepatobiliary: No focal liver lesions are evident. There is no
appreciable gallbladder wall thickening. No biliary duct dilatation.

Pancreas: No pancreatic mass or inflammatory focus evident.

Spleen: No splenic lesions are evident.

Adrenals/Urinary Tract: Adrenals appear normal bilaterally. Kidneys
bilaterally show no apparent mass or hydronephrosis on either side.
There is no or ureteral calculus on either side. The urinary bladder
is midline with wall thickness within normal limits.

Stomach/Bowel: There is no appreciable bowel wall or mesenteric
thickening. No bowel obstruction. No free air or portal venous air.

Vascular/Lymphatic: There is no abdominal aortic aneurysm. No
vascular lesions are evident on this noncontrast enhanced study.
There is no demonstrable adenopathy in the abdomen or pelvis.

Reproductive: Uterus is retroverted. No pelvic mass or pelvic fluid
collection. There are a few small benign-appearing calcifications in
the vagina, likely within glandular tissue in the vaginal region,
stable.

Other: Appendix appears normal. There is no ascites or abscess in
the abdomen or pelvis.

Musculoskeletal: There are no blastic or lytic bone lesions. There
is no intramuscular or abdominal wall lesion.
IMPRESSION: A cause for patient's symptoms has not been established with this
study. No renal or ureteral calculi. No hydronephrosis.

No bowel obstruction. No abscess. Appendix appears normal. Small
calcifications in the vagina are stable and benign in appearance.

## 2016-01-29 MED ORDER — METHYLPREDNISOLONE SODIUM SUCC 125 MG IJ SOLR
125.0000 mg | Freq: Once | INTRAMUSCULAR | Status: AC
  Administered 2016-01-29: 125 mg via INTRAVENOUS
  Filled 2016-01-29: qty 2

## 2016-01-29 MED ORDER — CEFTRIAXONE SODIUM 1 G IJ SOLR
1.0000 g | Freq: Once | INTRAMUSCULAR | Status: AC
  Administered 2016-01-29: 1 g via INTRAVENOUS
  Filled 2016-01-29: qty 10

## 2016-01-29 MED ORDER — SODIUM CHLORIDE 0.9 % IV BOLUS (SEPSIS)
500.0000 mL | Freq: Once | INTRAVENOUS | Status: AC
  Administered 2016-01-29: 500 mL via INTRAVENOUS

## 2016-01-29 MED ORDER — PREDNISONE 50 MG PO TABS: 50.0000 mg | ORAL_TABLET | Freq: Every day | ORAL | Status: DC

## 2016-01-29 MED ORDER — CEPHALEXIN 500 MG PO CAPS: 500.0000 mg | ORAL_CAPSULE | Freq: Three times a day (TID) | ORAL | Status: AC

## 2016-01-29 MED ORDER — DIPHENHYDRAMINE HCL 50 MG/ML IJ SOLN
25.0000 mg | Freq: Once | INTRAMUSCULAR | Status: AC
  Administered 2016-01-29: 25 mg via INTRAVENOUS
  Filled 2016-01-29: qty 1

## 2016-01-29 NOTE — ED Notes (Signed)
Pt presents with multiple complaints-abd pain, urinary frequency, facial swelling and numbness in right foot; symptoms similar to a ectopic pregnancy years ago

## 2016-01-29 NOTE — ED Notes (Signed)
Pt presents with some hives and itching x 1 week. States she has had abdominal pain and some bleeding 2 weeks after last period. She thinks symptoms are similar to ectopic pregnancy in the past. NAD noted.

## 2016-01-29 NOTE — ED Provider Notes (Signed)
CSN: [id number]     Arrival date & time 01/29/16  [id number] History   First MD Initiated Contact with Patient 01/29/16 [phone]     Chief Complaint  Patient presents with  . Angioedema  . Urticaria  . Abdominal Pain  . Urinary Frequency     (Consider location/radiation/quality/duration/timing/severity/associated sxs/prior Treatment) The history is provided by the patient.  Teresa RosebushLatasha Meza is a 35 y.o. female history of ectopic pregnancy here presenting with multiple complaints. For the last week or so she's been having some hives and itchiness in her face. Also noted facial swelling. She also has some mild diffuse abdominal pain as well as urinary frequency. Denies any vomiting or fevers. He denies any tick bites or any recent travel anywhere. Denies exposure to poison ivy or poison oaks. She denies any history of seasonal allergies and is currently on no medications. States that she had ectopic pregnancy several years ago but last menstrual period was about 2 weeks ago.    History reviewed. No pertinent past medical history. Past Surgical History  Procedure Laterality Date  . Ectopic pregnancy surgery     History reviewed. No pertinent family history. Social History  Substance Use Topics  . Smoking status: Never Smoker   . Smokeless tobacco: None  . Alcohol Use: No   OB History    No data available     Review of Systems  Gastrointestinal: Positive for abdominal pain.  Genitourinary: Positive for frequency.  Skin: Positive for rash.  All other systems reviewed and are negative.     Allergies  Sulfa antibiotics  Home Medications   Prior to Admission medications   Medication Sig Start Date End Date Taking? Authorizing Provider  norgestimate-ethinyl estradiol (ORTHO-CYCLEN,SPRINTEC,PREVIFEM) 0.25-35 MG-MCG tablet Take 1 tablet by mouth daily.   Yes Historical Provider, MD   BP 114/77 mmHg  Pulse 86  Resp 18  Ht 5' 3.5" (1.613 m)  Wt 200 lb (90.719 kg)  BMI 34.87 kg/m2   SpO2 98%  LMP 01/15/2016 (Exact Date) Physical Exam  Constitutional: She is oriented to person, place, and time.  Anxious   HENT:  Head: Normocephalic.  Mouth/Throat: Oropharynx is clear and moist.  Mild diffuse lip swelling, OP clear.   Eyes: Conjunctivae are normal. Pupils are equal, round, and reactive to light.  Neck: Normal range of motion.  No stridor   Cardiovascular: Normal rate, regular rhythm and normal heart sounds.   Pulmonary/Chest: Effort normal and breath sounds normal. No respiratory distress. She has no wheezes. She has no rales.  Abdominal: Soft. Bowel sounds are normal. She exhibits no distension. There is no tenderness. There is no rebound.  No abdominal tenderness, no CVAT   Musculoskeletal: Normal range of motion.  Neurological: She is alert and oriented to person, place, and time. No cranial nerve deficit. Coordination normal.  Skin: Skin is warm.  Some hives on the face, not involving MM. No obvious tick bites or cellulitis.   Psychiatric: She has a normal mood and affect. Her behavior is normal. Judgment and thought content normal.  Nursing note and vitals reviewed.   ED Course  Procedures (including critical care time) Labs Review Labs Reviewed  COMPREHENSIVE METABOLIC PANEL - Abnormal; Notable for the following:    Glucose, Bld 155 (*)    Calcium 8.8 (*)    ALT 12 (*)    All other components within normal limits  URINALYSIS COMPLETEWITH MICROSCOPIC ([facility] ONLY) - Abnormal; Notable for the following:    Color, Urine YELLOW (*)  APPearance CLOUDY (*)    Hgb urine dipstick 3+ (*)    Protein, ur 30 (*)    Leukocytes, UA 1+ (*)    Squamous Epithelial / LPF 0-5 (*)    All other components within normal limits  URINE CULTURE  CBC  POC URINE PREG, ED  POCT PREGNANCY, URINE    Imaging Review Ct Renal Stone Study  01/29/2016  CLINICAL DATA:  Left-sided flank pain with hematuria EXAM: CT ABDOMEN AND PELVIS WITHOUT CONTRAST TECHNIQUE: Multidetector CT  imaging of the abdomen and pelvis was performed following the standard protocol without oral or intravenous contrast material administration. COMPARISON:  June 11, 2012 FINDINGS: Lower chest:  Lung bases are clear. Hepatobiliary: No focal liver lesions are evident. There is no appreciable gallbladder wall thickening. No biliary duct dilatation. Pancreas: No pancreatic mass or inflammatory focus evident. Spleen: No splenic lesions are evident. Adrenals/Urinary Tract: Adrenals appear normal bilaterally. Kidneys bilaterally show no apparent mass or hydronephrosis on either side. There is no or ureteral calculus on either side. The urinary bladder is midline with wall thickness within normal limits. Stomach/Bowel: There is no appreciable bowel wall or mesenteric thickening. No bowel obstruction. No free air or portal venous air. Vascular/Lymphatic: There is no abdominal aortic aneurysm. No vascular lesions are evident on this noncontrast enhanced study. There is no demonstrable adenopathy in the abdomen or pelvis. Reproductive: Uterus is retroverted. No pelvic mass or pelvic fluid collection. There are a few small benign-appearing calcifications in the vagina, likely within glandular tissue in the vaginal region, stable. Other: Appendix appears normal. There is no ascites or abscess in the abdomen or pelvis. Musculoskeletal: There are no blastic or lytic bone lesions. There is no intramuscular or abdominal wall lesion. IMPRESSION: A cause for patient's symptoms has not been established with this study. No renal or ureteral calculi. No hydronephrosis. No bowel obstruction. No abscess. Appendix appears normal. Small calcifications in the vagina are stable and benign in appearance. Electronically Signed   By: Bretta Bang III M.D.   On: 01/29/2016 09:28   I have personally reviewed and evaluated these images and lab results as part of my medical decision-making.   EKG Interpretation None      MDM    Final diagnoses:  None    Teresa Meza is a 35 y.o. female here with hives, facial swelling, ab pain, urinary frequency. Likely seasonal allergies. No travel or tick bites. Rash doesn't appear cellulitic. No on meds and no signs of angioedema. Has previous ectopic pregnancy so will get UCG. Has no lower abdominal tenderness to suggest ectopic. Will get labs, UCG, UA. Will give steroids, benadryl and reassess.   10:37 AM UA + blood and UTI. Has some dysuria. UCG neg. CT renal stone showed no kidney stone. Rash on face and swelling on face improved. Given rocephin for UTI. Will dc home with keflex, prednisone for facial swelling and seasonal allergies, benadryl as needed.     Richardean Canal, MD 01/29/16 [id number]

## 2016-01-29 NOTE — ED Notes (Signed)
Pt also having low back pain and vaginal bleeding, LMP was 2 weeks

## 2016-01-29 NOTE — Discharge Instructions (Signed)
Take prednisone as prescribed.   Take benadryl 25 mg every 6 hrs for itchiness.   Take motrin, tylenol for pain.   Take keflex three times daily for a week.   See your doctor  Return to ER if you have worse rash, facial swelling, abdominal pain, fevers

## 2016-01-31 LAB — URINE CULTURE

## 2016-05-06 ENCOUNTER — Emergency Department: Payer: Self-pay

## 2016-05-06 ENCOUNTER — Encounter: Payer: Self-pay | Admitting: Emergency Medicine

## 2016-05-06 ENCOUNTER — Emergency Department
Admission: EM | Admit: 2016-05-06 | Discharge: 2016-05-07 | Disposition: A | Discharge status: Other Acute Inpatient Hospital | Payer: Self-pay | Attending: Emergency Medicine | Admitting: Emergency Medicine

## 2016-05-06 DIAGNOSIS — L03113 Cellulitis of right upper limb: Secondary | ICD-10-CM | POA: Insufficient documentation

## 2016-05-06 DIAGNOSIS — R0789 Other chest pain: Secondary | ICD-10-CM | POA: Insufficient documentation

## 2016-05-06 DIAGNOSIS — R079 Chest pain, unspecified: Secondary | ICD-10-CM

## 2016-05-06 LAB — BASIC METABOLIC PANEL
Anion gap: 6 (ref 5–15)
BUN: 15 mg/dL (ref 6–20)
CALCIUM: 8.4 mg/dL — AB (ref 8.9–10.3)
CHLORIDE: 108 mmol/L (ref 101–111)
CO2: 23 mmol/L (ref 22–32)
CREATININE: 0.97 mg/dL (ref 0.44–1.00)
GFR calc non Af Amer: >60 mL/min (ref >60)
Glucose, Bld: 113 mg/dL — ABNORMAL HIGH (ref 65–99)
Potassium: 4.2 mmol/L (ref 3.5–5.1)
SODIUM: 137 mmol/L (ref 135–145)

## 2016-05-06 LAB — CBC
HCT: 40.5 % (ref 35.0–47.0)
Hemoglobin: 13.6 g/dL (ref 12.0–16.0)
MCH: 28.7 pg (ref 26.0–34.0)
MCHC: 33.7 g/dL (ref 32.0–36.0)
MCV: 85.2 fL (ref 80.0–100.0)
PLATELETS: 236 10*3/uL (ref 150–440)
RBC: 4.75 MIL/uL (ref 3.80–5.20)
RDW: 13.7 % (ref 11.5–14.5)
WBC: 5.1 10*3/uL (ref 3.6–11.0)

## 2016-05-06 LAB — POCT PREGNANCY, URINE: Preg Test, Ur: NEGATIVE

## 2016-05-06 LAB — TROPONIN I: Troponin I: <0.03 ng/mL (ref <0.03)

## 2016-05-06 IMAGING — CR DG CHEST 2V
1 series · 2 of 2 positions shown · non-contrast
Comparison: Chest radiograph performed [DATE]

CLINICAL DATA: Acute onset of central chest pain. Initial
encounter.

EXAM:
CHEST  2 VIEW

[Series 1: dg chest 2 view · 0.14mm/px · 2 of 2 slices shown]
[im 1/2]
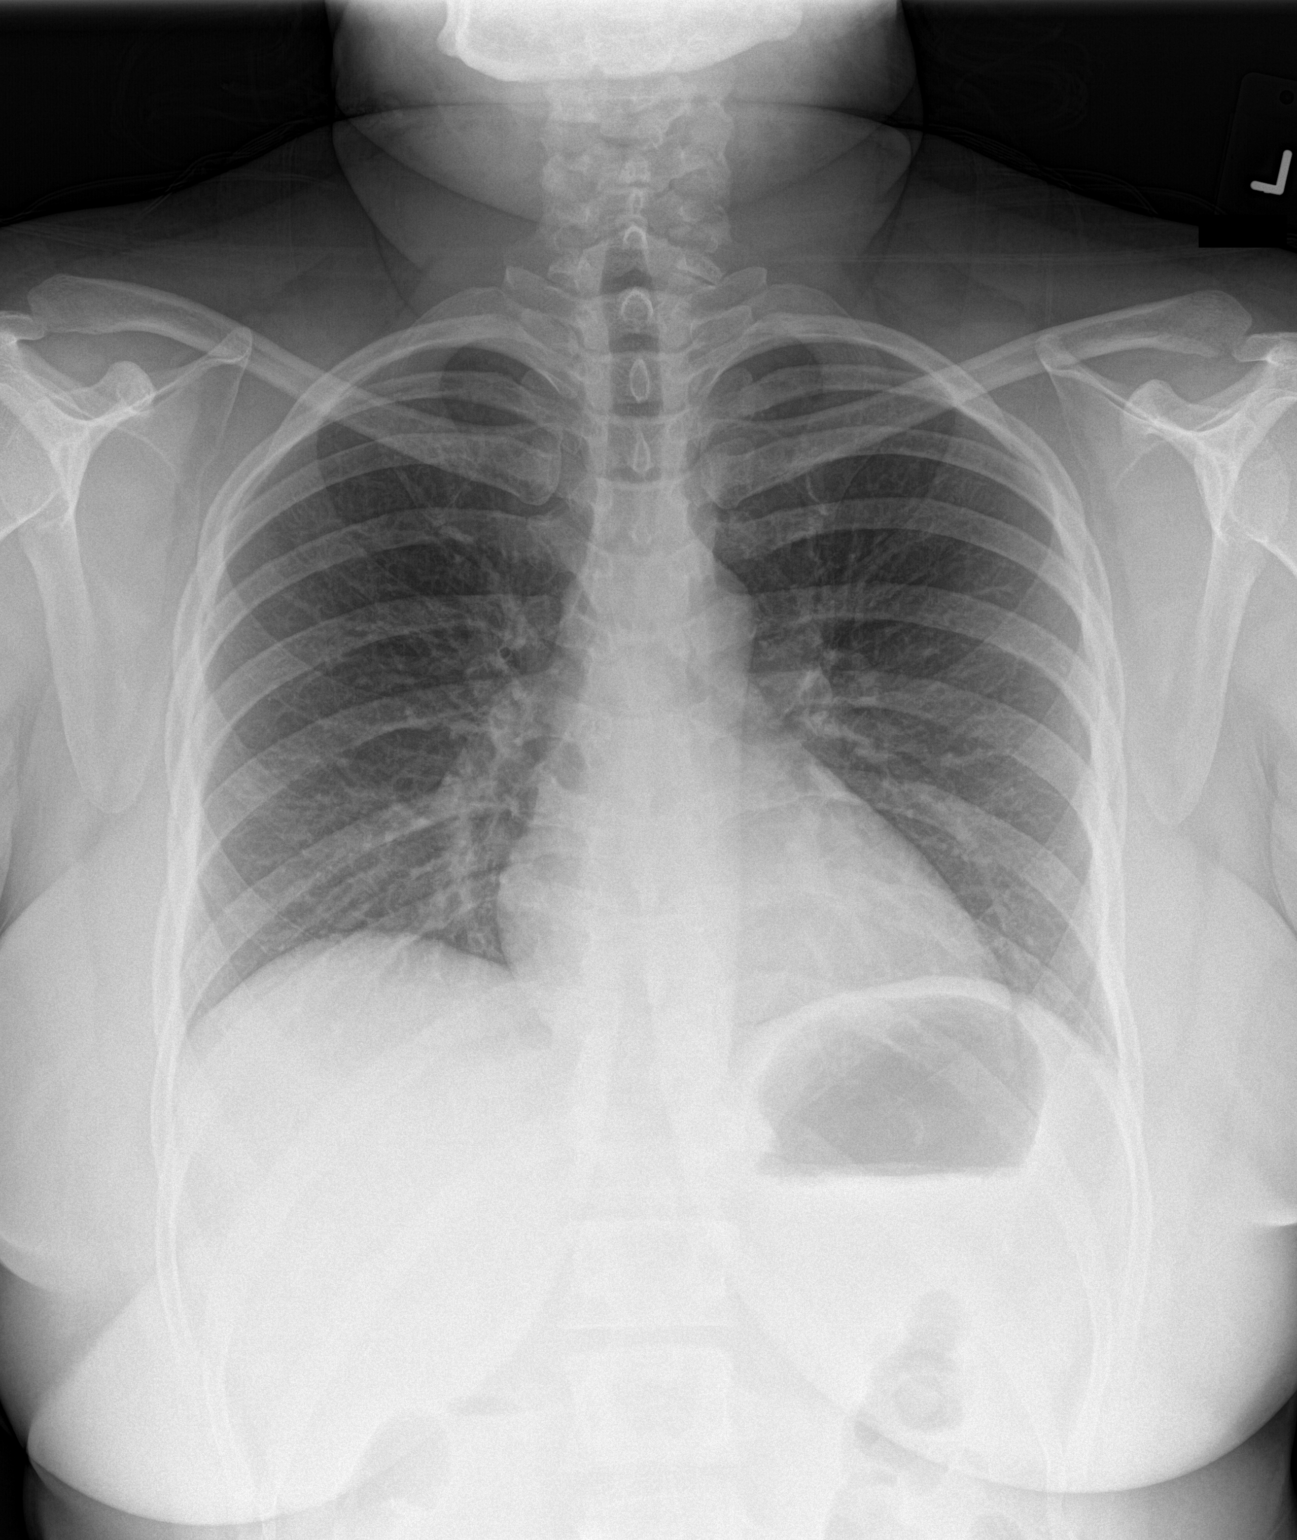
[im 2/2]
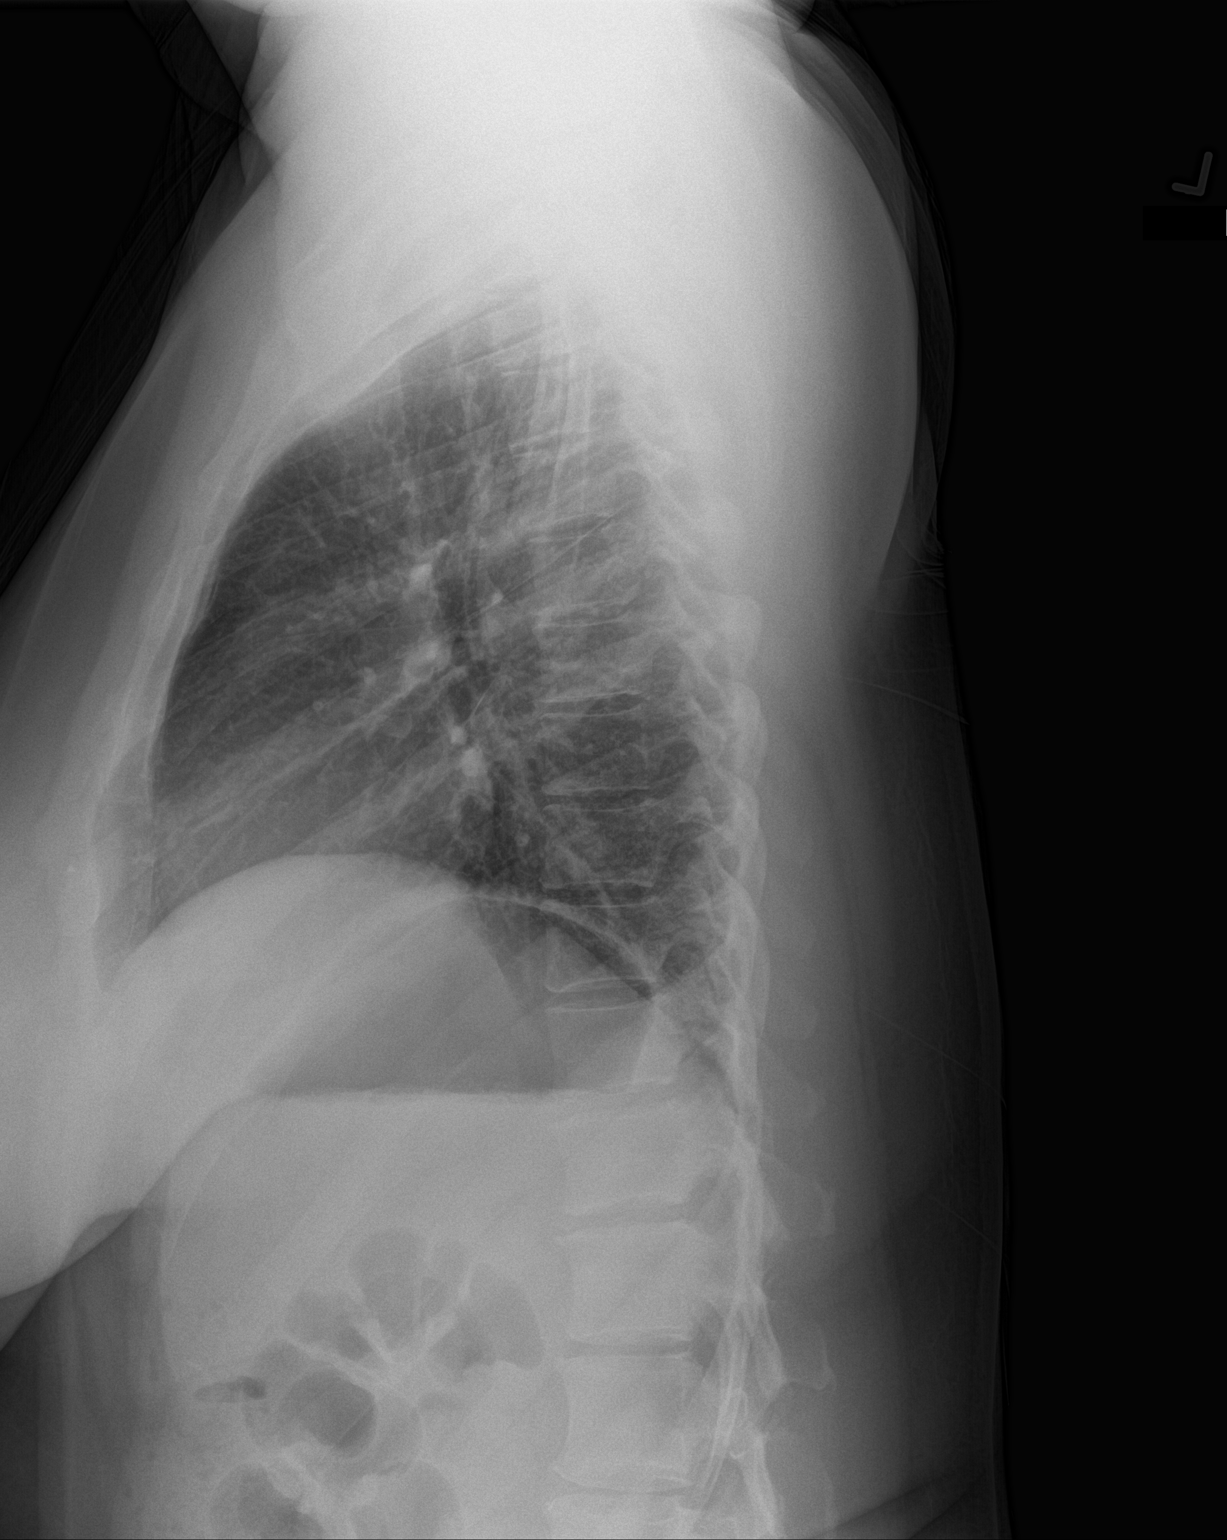

[2 of 2 positions shown; findings below may reference images not displayed]

FINDINGS: The lungs are well-aerated. Pulmonary vascularity is at the upper
limits of normal. There is no evidence of focal opacification,
pleural effusion or pneumothorax.

The heart is normal in size; the mediastinal contour is within
normal limits. No acute osseous abnormalities are seen.
IMPRESSION: No acute cardiopulmonary process seen.

## 2016-05-06 MED ORDER — CEPHALEXIN 500 MG PO CAPS
500.0000 mg | ORAL_CAPSULE | Freq: Once | ORAL | Status: AC
  Administered 2016-05-06: 500 mg via ORAL
  Filled 2016-05-06: qty 1

## 2016-05-06 MED ORDER — CEPHALEXIN 500 MG PO CAPS: 500.0000 mg | ORAL_CAPSULE | Freq: Three times a day (TID) | ORAL | 0 refills | Status: AC

## 2016-05-06 NOTE — ED Provider Notes (Signed)
[facility] Emergency Department Provider Note    First MD Initiated Contact with Patient 05/06/16 1110     (approximate)  I have reviewed the triage vital signs and the nursing notes.   HISTORY  Chief Complaint Insect Bite and Chest Pain   HPI Teresa Meza is a 35 y.o. female with no past medical history presents to the emergency department with right forearm tenderness erythema and swelling which she noted on awakening this morning. Patient states that the area has increased in size circumferentially since she first noticed it. Patient suspects that she may have been stung by an insect. In addition patient admits to episodic chest pain that has been going on for approximately 1 week. Patient states that the episodes last approximately 5 minutes and occurs during physical and emotional stress. Patient admits to dyspnea during chest discomfort. Patient denies any diaphoresis or dizziness with the events. Patient has no chest pain at present. Patient denies any shortness of breath dizziness or diaphoresis at this time.   Past medical history None There are no active problems to display for this patient.   Past Surgical History:  Procedure Laterality Date  . ECTOPIC PREGNANCY SURGERY      Prior to Admission medications   Medication Sig Start Date End Date Taking? Authorizing Provider  norgestimate-ethinyl estradiol (ORTHO-CYCLEN,SPRINTEC,PREVIFEM) 0.25-35 MG-MCG tablet Take 1 tablet by mouth daily.    Historical Provider, MD  predniSONE (DELTASONE) 50 MG tablet Take 1 tablet (50 mg total) by mouth daily. 2 tabs po daily x 4 days 01/29/16   Charlynne Pander, MD    Allergies Sulfa antibiotics  No family history on file.  Social History Social History  Substance Use Topics  . Smoking status: Never Smoker  . Smokeless tobacco: Never Used  . Alcohol use No    Review of Systems Constitutional: No fever/chills Eyes: No visual changes. ENT: No  sore throat. Cardiovascular: Denies chest pain. Respiratory: Denies shortness of breath. Gastrointestinal: No abdominal pain.  No nausea, no vomiting.  No diarrhea.  No constipation. Genitourinary: Negative for dysuria. Musculoskeletal: Negative for back pain. Skin: Positive for right forearm redness swelling and pain Neurological: Negative for headaches, focal weakness or numbness.  10-point ROS otherwise negative.  ____________________________________________   PHYSICAL EXAM:  VITAL SIGNS: ED Triage Vitals  Enc Vitals Group     BP 05/06/16 1949 (!) 148/78     Pulse Rate 05/06/16 1949 86     Resp 05/06/16 1949 15     Temp 05/06/16 1949 98.7 F (37.1 C)     Temp Source 05/06/16 1949 Oral     SpO2 05/06/16 1949 99 %     Weight 05/06/16 1949 185 lb (83.9 kg)     Height 05/06/16 1949 5\' 3"  (1.6 m)     Head Circumference --      Peak Flow --      Pain Score 05/06/16 1950 7     Pain Loc --      Pain Edu? --      Excl. in GC? --     Constitutional: Alert and oriented. Well appearing and in no acute distress. Eyes: Conjunctivae are normal. PERRL. EOMI. Head: Atraumatic. Ears:  Healthy appearing ear canals and TMs bilaterally Nose: No congestion/rhinnorhea. Mouth/Throat: Mucous membranes are moist.  Oropharynx non-erythematous. Neck: No stridor.  No meningeal signs.   Cardiovascular: Normal rate, regular rhythm. Good peripheral circulation. Grossly normal heart sounds. Respiratory: Normal respiratory effort.  No retractions. Lungs CTAB.  Gastrointestinal: Soft and nontender. No distention.  Musculoskeletal: No lower extremity tenderness nor edema. No gross deformities of extremities. Neurologic:  Normal speech and language. No gross focal neurologic deficits are appreciated.  Skin:  5 x 3 cm ovoid area of wedging erythema swelling Psychiatric: Mood and affect are normal. Speech and behavior are normal.  ____________________________________________   LABS (all labs ordered  are listed, but only abnormal results are displayed)  Labs Reviewed  BASIC METABOLIC PANEL - Abnormal; Notable for the following:       Result Value   Glucose, Bld 113 (*)    Calcium 8.4 (*)    All other components within normal limits  CBC  TROPONIN I  POCT PREGNANCY, URINE  POC URINE PREG, ED   ____________________________________________  EKG  ED ECG REPORT I, [place] N Dietra Stokely, the attending physician, personally viewed and interpreted this ECG.   Date: 05/06/2016  EKG Time: 7:37 PM  Rate: 86  Rhythm: Normal sinus rhythm  Axis: Normal  Intervals: Normal  ST&T Change: None ____________________________________________  RADIOLOGY I, [place] N Keyla Milone, personally viewed and evaluated these images (plain radiographs) as part of my medical decision making, as well as reviewing the written report by the radiologist.  Dg Chest 2 View  Result Date: 05/06/2016 CLINICAL DATA:  Acute onset of central chest pain. Initial encounter. EXAM: CHEST  2 VIEW COMPARISON:  Chest radiograph performed 11/22/2015 FINDINGS: The lungs are well-aerated. Pulmonary vascularity is at the upper limits of normal. There is no evidence of focal opacification, pleural effusion or pneumothorax. The heart is normal in size; the mediastinal contour is within normal limits. No acute osseous abnormalities are seen. IMPRESSION: No acute cardiopulmonary process seen. Electronically Signed   By: Roanna RaiderJeffery  Chang M.D.   On: 05/06/2016 20:45    Procedures     INITIAL IMPRESSION / ASSESSMENT AND PLAN / ED COURSE  Pertinent labs & imaging results that were available during my care of the patient were reviewed by me and considered in my medical decision making (see chart for details).  Patient received Keflex in the emergency department will be prescribed same for home. Patient advised to return to emergency department immediately if worsening erythema pain or fever. Regarding the patient's chest discomfort EKG  revealed no evidence of ST segment changes. Troponin negative. Patient will be referred to cardiology for further outpatient evaluation.   Clinical Course    ____________________________________________  FINAL CLINICAL IMPRESSION(S) / ED DIAGNOSES  Final diagnoses:  Cellulitis of right upper extremity  Chest pain, unspecified type     MEDICATIONS GIVEN DURING THIS VISIT:  Medications - No data to display   NEW OUTPATIENT MEDICATIONS STARTED DURING THIS VISIT:  New Prescriptions   No medications on file    Modified Medications   No medications on file    Discontinued Medications   No medications on file     Note:  This document was prepared using Dragon voice recognition software and may include unintentional dictation errors.    Darci Currentandolph N Jalessa Peyser, MD 05/06/16 [phone]

## 2016-05-06 NOTE — ED Triage Notes (Signed)
Pt ambulatory to triage with steady gait, no distress noted. Pt reports she woke this AM with a reddened raised area to the right forearm. Pt c/o pain in chest since area was noted. Pt reports she took benadryl the AM due to possible alergic reaction. Pt still having chest discomfort. Denies SOB, N/V or dizziness at this time.

## 2016-05-07 NOTE — ED Notes (Signed)
Patient discharged to home per MD order. Patient in stable condition, and deemed medically cleared by ED provider for discharge. Discharge instructions reviewed with patient/family using "Teach Back"; verbalized understanding of medication education and administration, and information about follow-up care. Denies further concerns. ° °

## 2016-09-26 ENCOUNTER — Encounter: Payer: Self-pay | Admitting: *Deleted

## 2016-09-26 ENCOUNTER — Emergency Department
Admission: EM | Admit: 2016-09-26 | Discharge: 2016-09-26 | Disposition: A | Discharge status: Home / Self Care | Payer: Self-pay | Attending: Emergency Medicine | Admitting: Emergency Medicine

## 2016-09-26 DIAGNOSIS — N939 Abnormal uterine and vaginal bleeding, unspecified: Secondary | ICD-10-CM | POA: Insufficient documentation

## 2016-09-26 DIAGNOSIS — N9489 Other specified conditions associated with female genital organs and menstrual cycle: Secondary | ICD-10-CM | POA: Insufficient documentation

## 2016-09-26 LAB — ABO/RH: ABO/RH(D): B POS

## 2016-09-26 LAB — HCG, QUANTITATIVE, PREGNANCY: hCG, Beta Chain, Quant, S: <1 m[IU]/mL (ref <5)

## 2016-09-26 NOTE — ED Notes (Signed)
ED Provider at bedside. 

## 2016-09-26 NOTE — ED Triage Notes (Signed)
Pt complains of vaginal spotting after not having a menstrual periods for two months, pt reports having a negative regency test , pt complains of left sided pelvic month, pt had a eutopic pregnancy 11 years ago

## 2016-09-26 NOTE — ED Provider Notes (Signed)
[facility] Emergency Department Provider Note  ____________________________________________   I have reviewed the triage vital signs and the nursing notes.   HISTORY  Chief Complaint Vaginal Bleeding    HPI Teresa Meza is a 36 y.o. female who had an ectopic 10 years ago and is very concerned about this. She does have history of irregular periods in the past. She missed her last menstrual period. She is concerned of breath she was having ectopic because now she is having cramping and she is starting to have her menstrual bleeding. Not heavy bleeding. About the normal for her period. She states that she does some cramps. She denies any lateralizing cramping to me although currently she said something about that in triage. She was very concerned that she might be having another ectopic pregnancy. That is the reason she is here. She had negative pregnancy tests at home however. She denies any significant pain, she denies any lightheadedness, she is not bleeding significantly at this time. Patient states "I don't want to get in the stirrups and have a pelvic exam if I'm not pregnant".       History reviewed. No pertinent past medical history.  There are no active problems to display for this patient.   Past Surgical History:  Procedure Laterality Date  . ECTOPIC PREGNANCY SURGERY      Prior to Admission medications   Medication Sig Start Date End Date Taking? Authorizing Provider  norgestimate-ethinyl estradiol (ORTHO-CYCLEN,SPRINTEC,PREVIFEM) 0.25-35 MG-MCG tablet Take 1 tablet by mouth daily.    Historical Provider, MD  predniSONE (DELTASONE) 50 MG tablet Take 1 tablet (50 mg total) by mouth daily. 2 tabs po daily x 4 days 01/29/16   Charlynne Pander, MD    Allergies Sulfa antibiotics  No family history on file.  Social History Social History  Substance Use Topics  . Smoking status: Never Smoker  . Smokeless tobacco: Never Used  . Alcohol use  No    Review of Systems Constitutional: No fever/chills Eyes: No visual changes. ENT: No sore throat. No stiff neck no neck pain Cardiovascular: Denies chest pain. Respiratory: Denies shortness of breath. Gastrointestinal:   no vomiting.  No diarrhea.  No constipation. Genitourinary: Negative for dysuria. Musculoskeletal: Negative lower extremity swelling Skin: Negative for rash. Neurological: Negative for severe headaches, focal weakness or numbness. 10-point ROS otherwise negative.  ____________________________________________   PHYSICAL EXAM:  VITAL SIGNS: ED Triage Vitals  Enc Vitals Group     BP 09/26/16 1354 121/78     Pulse Rate 09/26/16 1354 86     Resp 09/26/16 1354 20     Temp 09/26/16 1354 98.6 F (37 C)     Temp Source 09/26/16 1354 Oral     SpO2 09/26/16 1354 99 %     Weight 09/26/16 1354 208 lb (94.3 kg)     Height 09/26/16 1354 5\' 3"  (1.6 m)     Head Circumference --      Peak Flow --      Pain Score 09/26/16 1355 7     Pain Loc --      Pain Edu? --      Excl. in GC? --     Constitutional: Alert and oriented. Well appearing and in no acute distress. Eyes: Conjunctivae are normal. PERRL. EOMI. Head: Atraumatic. Nose: No congestion/rhinnorhea. Mouth/Throat: Mucous membranes are moist.  Oropharynx non-erythematous. Neck: No stridor.   Nontender with no meningismus Cardiovascular: Normal rate, regular rhythm. Grossly normal heart sounds.  Good peripheral  circulation. Respiratory: Normal respiratory effort.  No retractions. Lungs CTAB. Abdominal: Soft and nontender. No distention. No guarding no rebound Back:  There is no focal tenderness or step off.  there is no midline tenderness there are no lesions noted. there is no CVA tenderness Musculoskeletal: No lower extremity tenderness, no upper extremity tenderness. No joint effusions, no DVT signs strong distal pulses no edema Neurologic:  Normal speech and language. No gross focal neurologic deficits are  appreciated.  Skin:  Skin is warm, dry and intact. No rash noted. Psychiatric: Mood and affect are normal. Speech and behavior are normal.  ____________________________________________   LABS (all labs ordered are listed, but only abnormal results are displayed)  Labs Reviewed  HCG, QUANTITATIVE, PREGNANCY  ABO/RH   ____________________________________________  EKG  I personally interpreted any EKGs ordered by me or triage  ____________________________________________  RADIOLOGY  I reviewed any imaging ordered by me or triage that were performed during my shift and, if possible, patient and/or family made aware of any abnormal findings. ____________________________________________   PROCEDURES  Procedure(s) performed: None  Procedures  Critical Care performed: None  ____________________________________________   INITIAL IMPRESSION / ASSESSMENT AND PLAN / ED COURSE  Pertinent labs & imaging results that were available during my care of the patient were reviewed by me and considered in my medical decision making (see chart for details).  Patient no acute distress, she is not pregnant, I have explained to her she could've had a "chemical" pregnancy last month, or that she could be entering menopause or any other post of reasons why she missed her period last month. Patient has had irregular periods in the past. In any event, she is not pregnant. I don't detect any significant pathology on normal abdominal exam. Patient is very relieved that she is not having an ectopic to the extent that I can determine. I did offer her a pelvic exam and an ultrasound to further evaluate her anatomy and any possible pathology and she refuses. She states she is really here just to make sure she is not pregnant. I did a splint to her that there are entities such as ovarian cyst etc. that I cannot rule out without further investigation she is very comfortable with going home she has an  appointment already tomorrow with her primary care doctor would prefer to follow-up there. She specifically does not wish an ultrasound or pelvic exam. She is here because she wants to make sure she was not pregnant. I have a splint to her that to the best of medical technology, there is no indication that she is currently pregnant given that she has a negative clot. Also, considering that she is Rh+, she did have a "chemical" pregnancy last month, she would not need program. As she is declining further intervention we'll discharge her. Patient is very comfortable with this plan. I have also explained to her that any time if she feels worse or reconsiders a further workup she should come back to the emergency department and she states that she will.    ____________________________________________   FINAL CLINICAL IMPRESSION(S) / ED DIAGNOSES  Final diagnoses:  None      This chart was dictated using voice recognition software.  Despite best efforts to proofread,  errors can occur which can change meaning.      Jeanmarie PlantJames A Brysan Mcevoy, MD 09/26/16 [phone]

## 2016-09-26 NOTE — Discharge Instructions (Signed)
You would prefer not to have a pelvic exam, other blood work or ultrasound to further your workup here. This is not unreasonable but if you feel worse in any way including increased bleeding, more in the pad an hour, increased pain, lightheadedness or you  feel worse in any way return to the emergency department. Follow closely with your primary care doctor as scheduled tomorrow and with your OB/GYN in the next day or 2.

## 2016-12-03 ENCOUNTER — Emergency Department: Payer: Self-pay

## 2016-12-03 ENCOUNTER — Encounter: Payer: Self-pay | Admitting: Emergency Medicine

## 2016-12-03 ENCOUNTER — Emergency Department
Admission: EM | Admit: 2016-12-03 | Discharge: 2016-12-03 | Disposition: A | Discharge status: Home / Self Care | Payer: Self-pay | Attending: Emergency Medicine | Admitting: Emergency Medicine

## 2016-12-03 DIAGNOSIS — R609 Edema, unspecified: Secondary | ICD-10-CM

## 2016-12-03 DIAGNOSIS — R600 Localized edema: Secondary | ICD-10-CM | POA: Insufficient documentation

## 2016-12-03 LAB — BASIC METABOLIC PANEL
ANION GAP: 4 — AB (ref 5–15)
BUN: 12 mg/dL (ref 6–20)
CHLORIDE: 108 mmol/L (ref 101–111)
CO2: 25 mmol/L (ref 22–32)
Calcium: 8.5 mg/dL — ABNORMAL LOW (ref 8.9–10.3)
Creatinine, Ser: 0.81 mg/dL (ref 0.44–1.00)
GFR calc Af Amer: >60 mL/min (ref >60)
Glucose, Bld: 109 mg/dL — ABNORMAL HIGH (ref 65–99)
POTASSIUM: 3.8 mmol/L (ref 3.5–5.1)
SODIUM: 137 mmol/L (ref 135–145)

## 2016-12-03 LAB — URINALYSIS, ROUTINE W REFLEX MICROSCOPIC
BILIRUBIN URINE: NEGATIVE
Bacteria, UA: NONE SEEN
Glucose, UA: NEGATIVE mg/dL
KETONES UR: NEGATIVE mg/dL
LEUKOCYTES UA: NEGATIVE
Nitrite: NEGATIVE
PH: 6 (ref 5.0–8.0)
PROTEIN: NEGATIVE mg/dL
Specific Gravity, Urine: 1.019 (ref 1.005–1.030)

## 2016-12-03 LAB — CBC WITH DIFFERENTIAL/PLATELET
BASOS ABS: 0.1 10*3/uL (ref 0–0.1)
Basophils Relative: 1 %
Eosinophils Absolute: 0.2 10*3/uL (ref 0–0.7)
Eosinophils Relative: 3 %
HEMATOCRIT: 38.3 % (ref 35.0–47.0)
HEMOGLOBIN: 12.7 g/dL (ref 12.0–16.0)
LYMPHS ABS: 1.5 10*3/uL (ref 1.0–3.6)
LYMPHS PCT: 26 %
MCH: 28.1 pg (ref 26.0–34.0)
MCHC: 33.2 g/dL (ref 32.0–36.0)
MCV: 84.7 fL (ref 80.0–100.0)
Monocytes Absolute: 0.5 10*3/uL (ref 0.2–0.9)
Monocytes Relative: 9 %
NEUTROS ABS: 3.5 10*3/uL (ref 1.4–6.5)
NEUTROS PCT: 61 %
Platelets: 234 10*3/uL (ref 150–440)
RBC: 4.53 MIL/uL (ref 3.80–5.20)
RDW: 13.5 % (ref 11.5–14.5)
WBC: 5.7 10*3/uL (ref 3.6–11.0)

## 2016-12-03 IMAGING — DX DG FOOT COMPLETE 3+V*L*
3 series · 3 of 3 positions shown · non-contrast
Comparison: None.

CLINICAL DATA: Dorsal foot pain and swelling.

EXAM:
LEFT FOOT - COMPLETE 3+ VIEW

[foot ap]
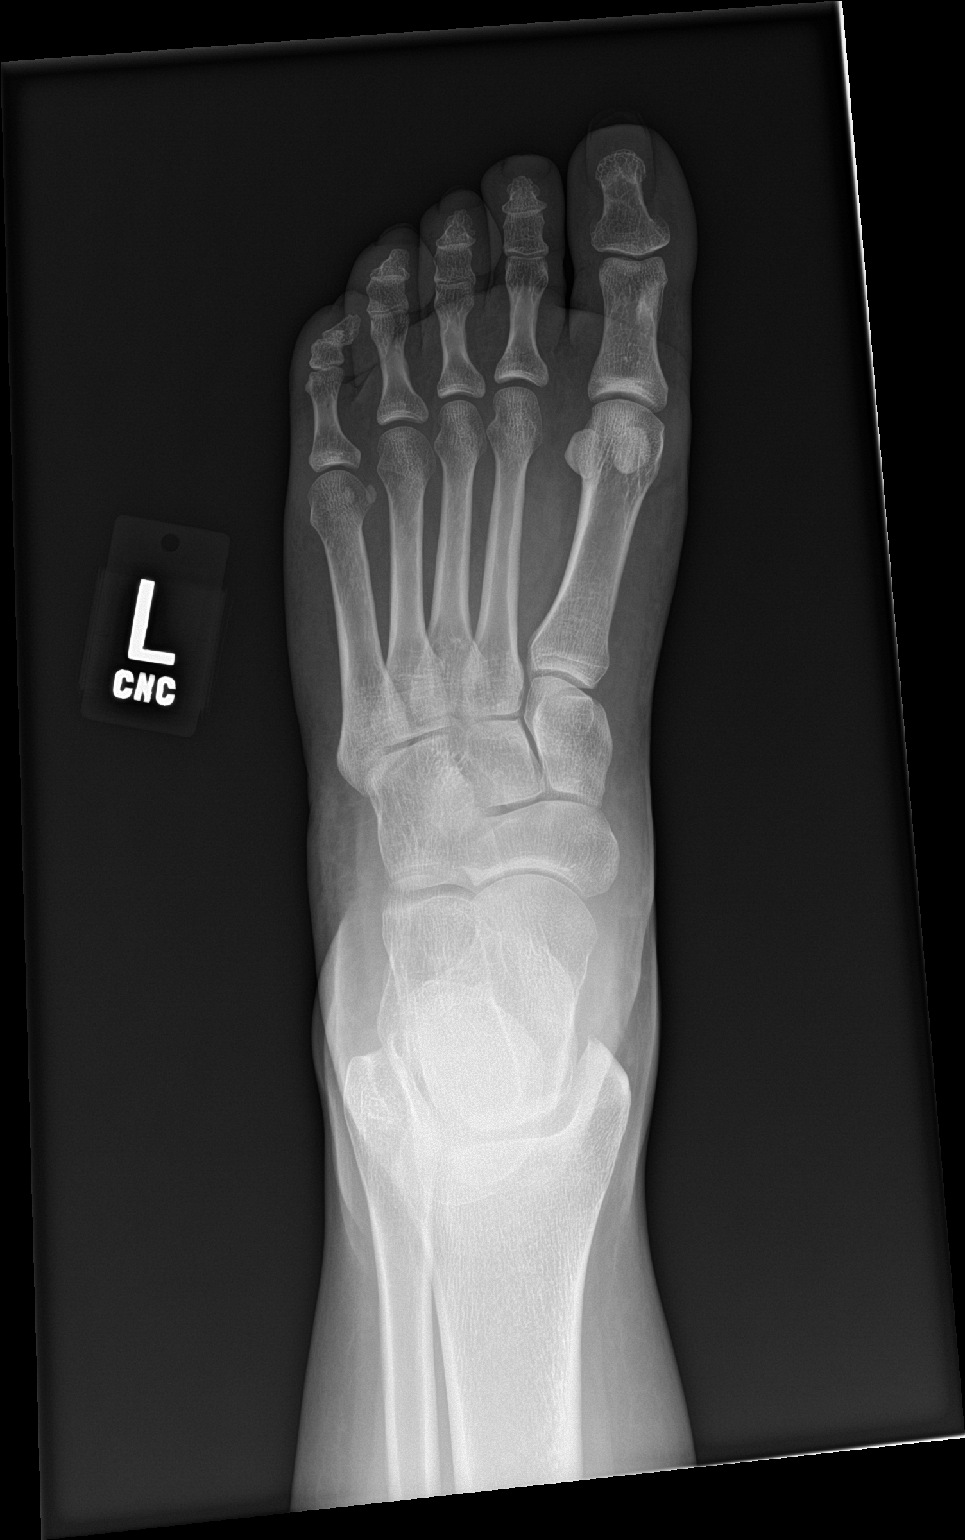

[foot obl]
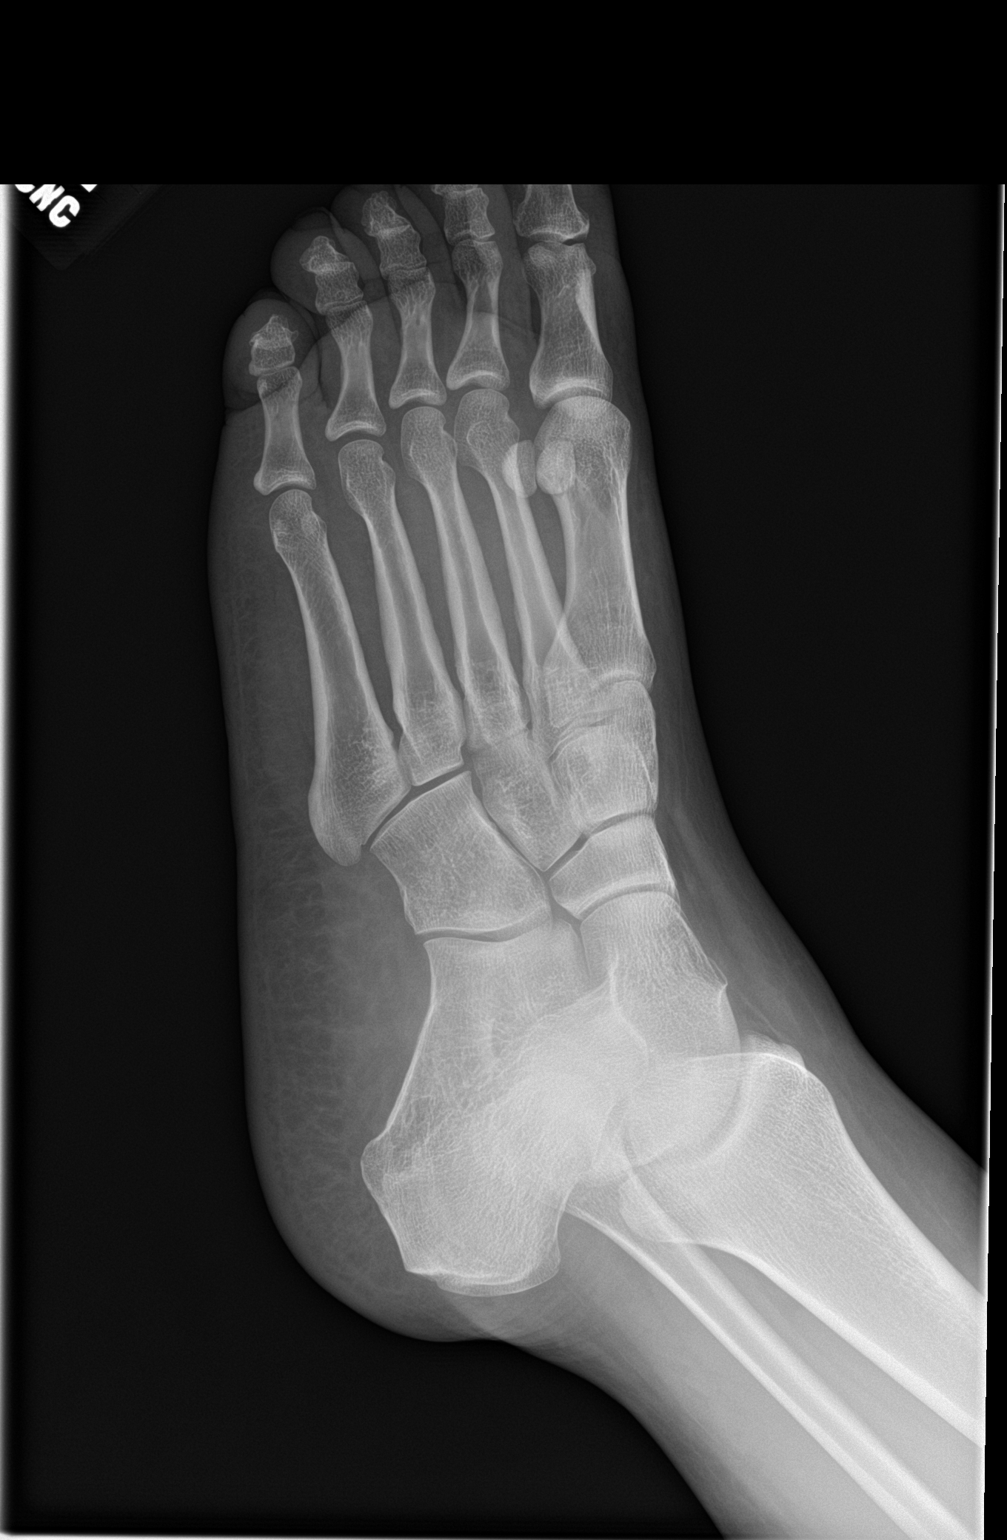

[foot lat]
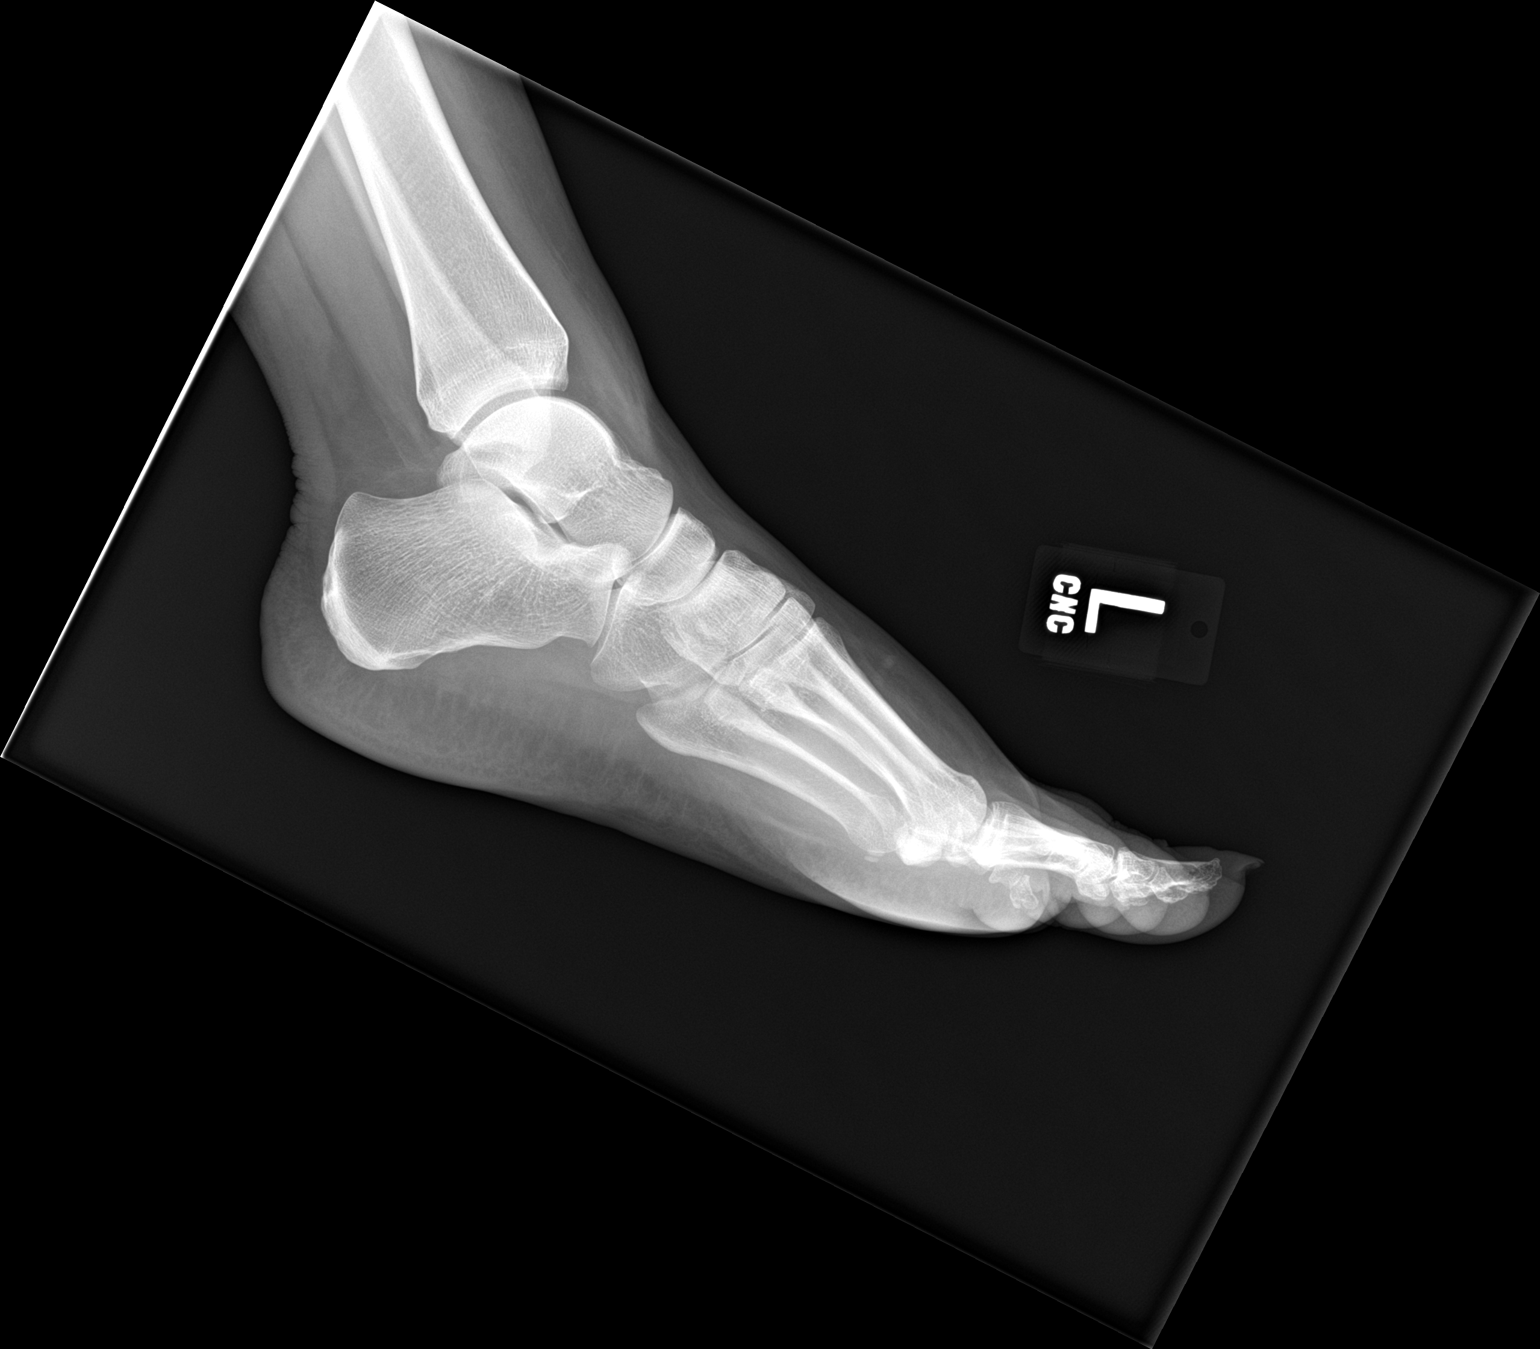

[3 of 3 positions shown; findings below may reference images not displayed]

FINDINGS: There is no evidence of fracture or dislocation. There is no
evidence of arthropathy or other focal bone abnormality. Soft
tissues are unremarkable.
IMPRESSION: Normal left foot.

## 2016-12-03 IMAGING — US US ABDOMEN LIMITED
1 series · 9 of 9 positions shown · non-contrast
Comparison: CT stone study [DATE]

CLINICAL DATA: Abdominal swelling. Assess for the presence of
ascites.

EXAM:
LIMITED ABDOMINAL ULTRASOUND

[Series 1: us abdomen limited · 0.25mm/px · 9 of 9 slices shown]
[im 1/9]
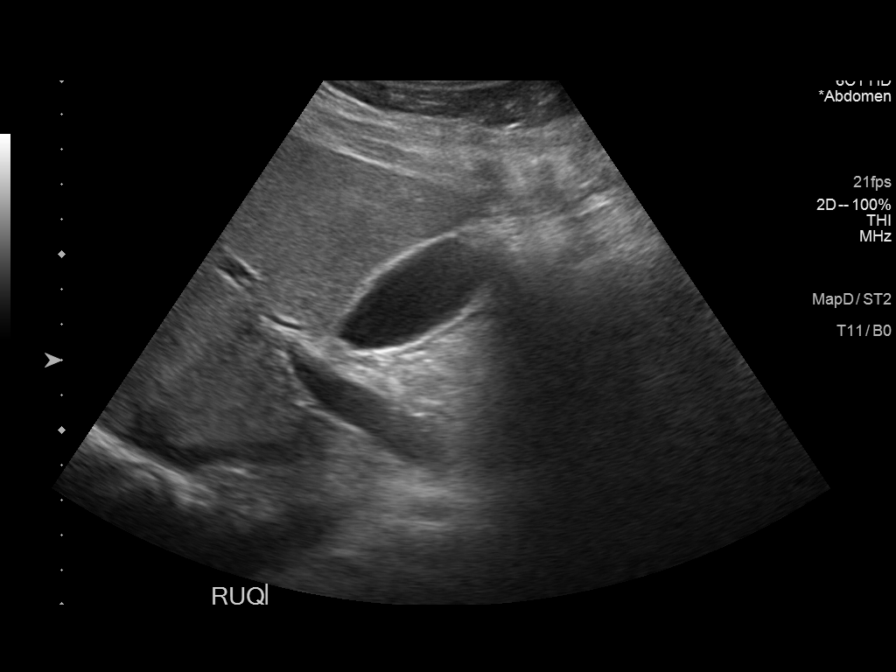
[im 2/9]
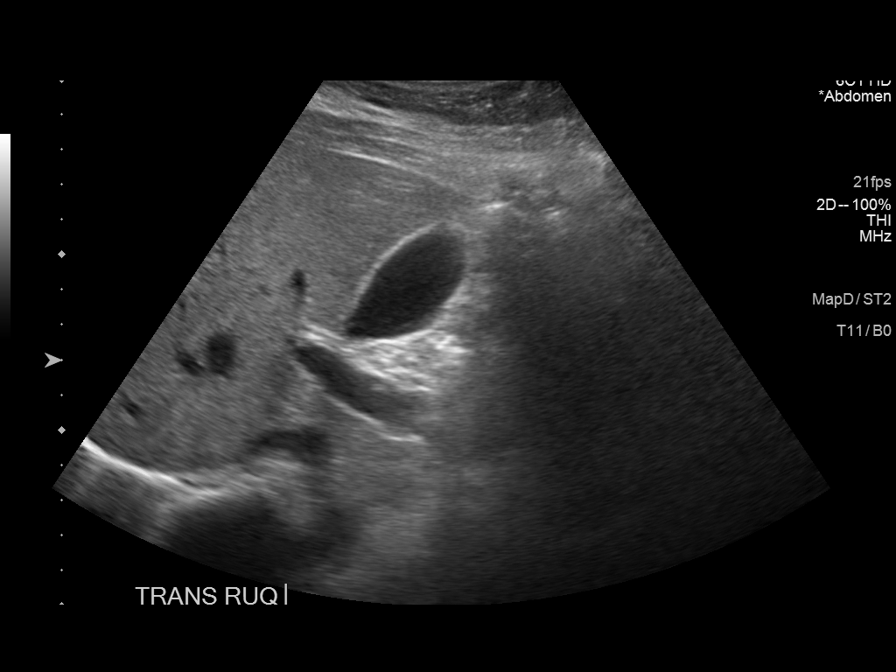
[im 3/9]
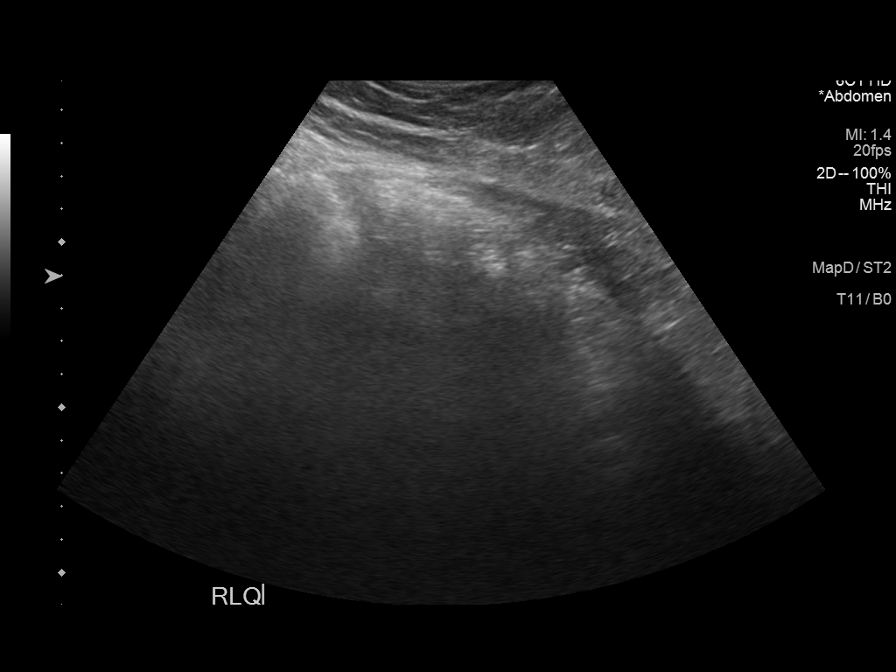
[im 4/9]
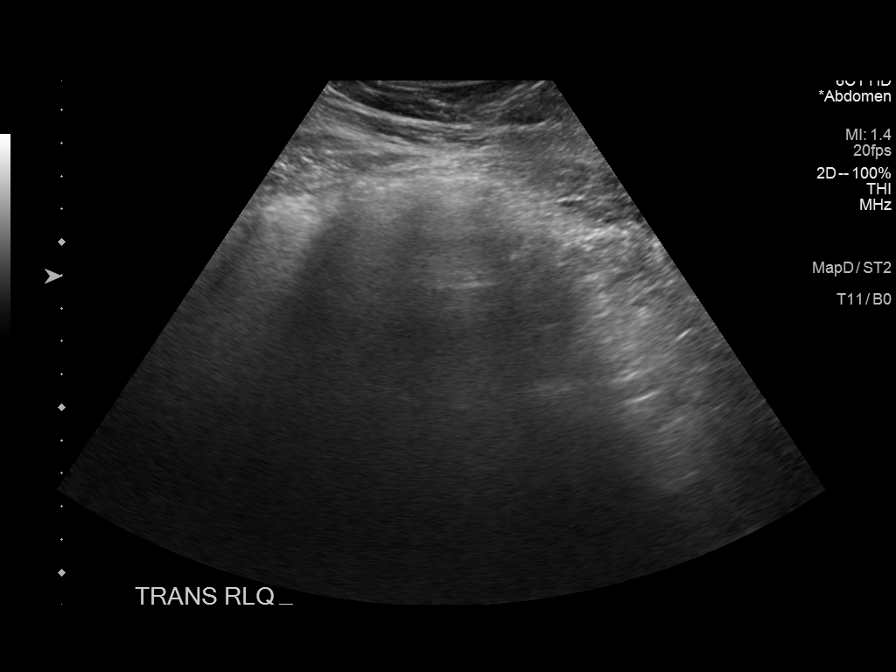
[im 5/9]
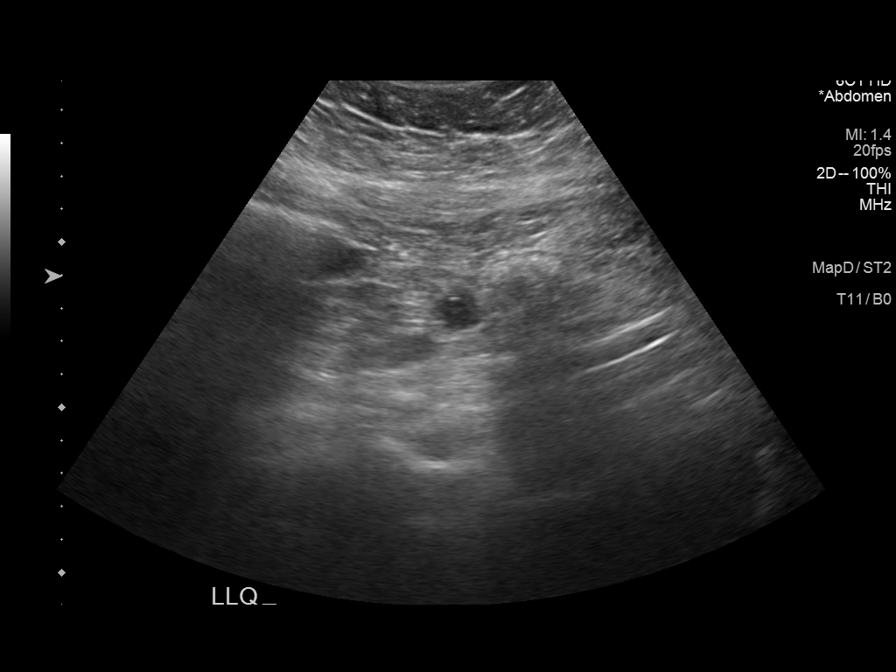
[im 6/9]
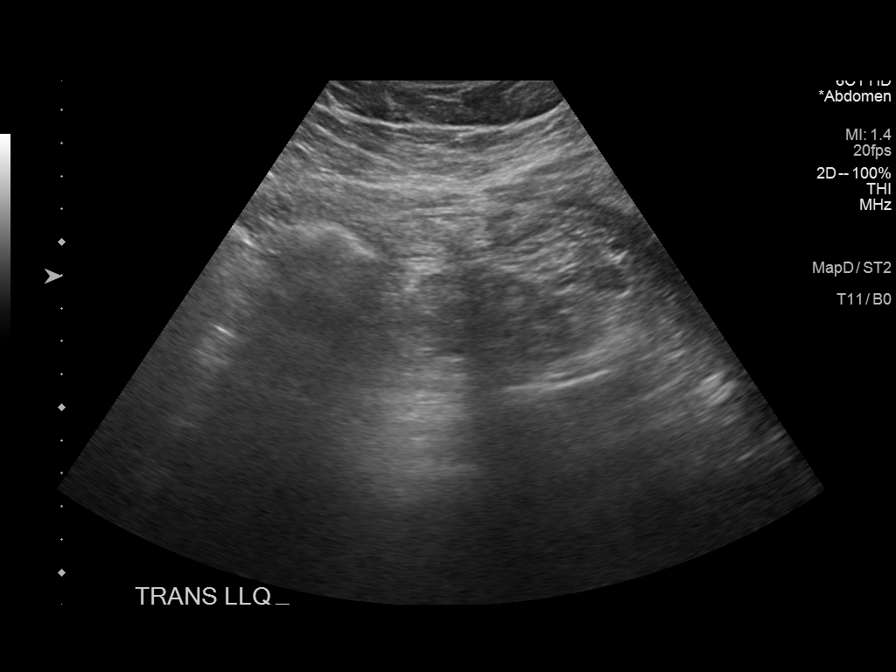
[im 7/9]
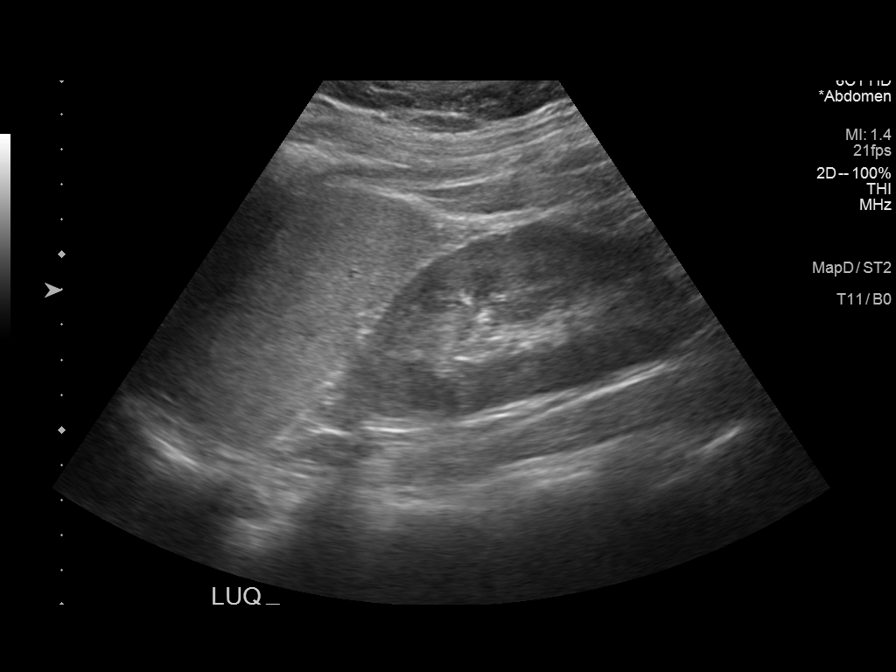
[im 8/9]
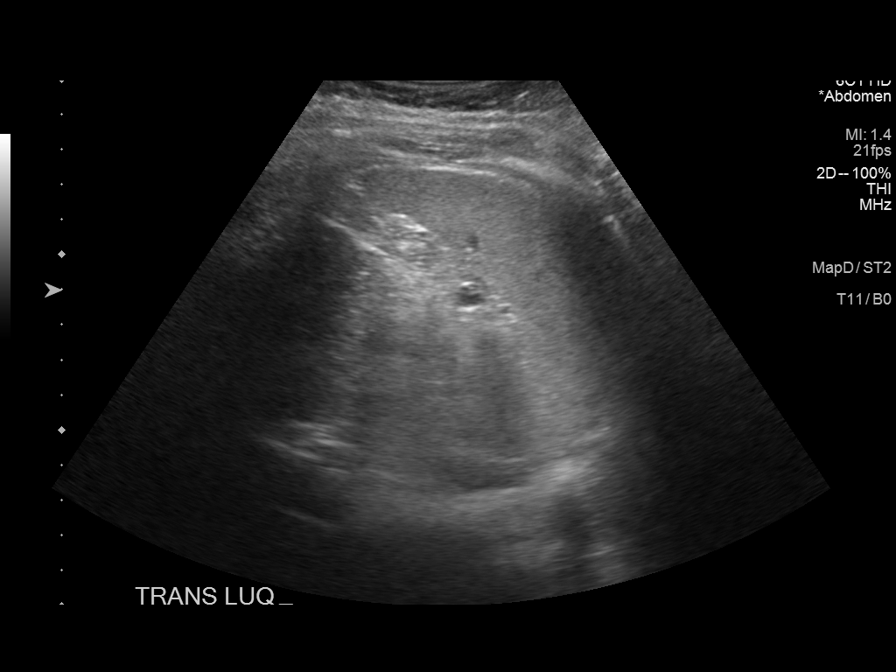
[im 9/9]
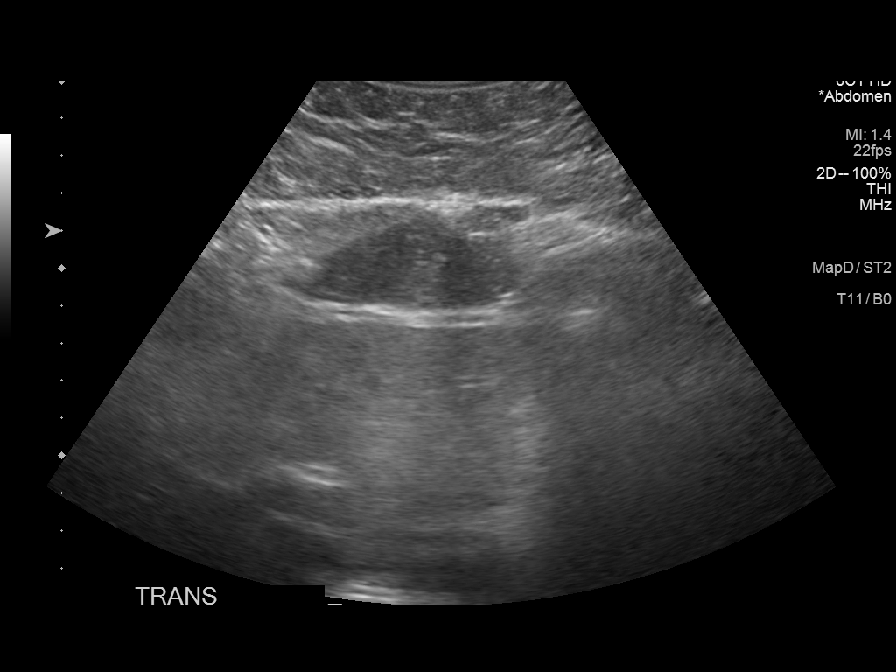

[9 of 9 positions shown; findings below may reference images not displayed]

FINDINGS: Interrogation of all 4 quadrants of the abdomen reveals no evidence
of ascites. The visualized soft tissues of the abdomen appear
normal.
IMPRESSION: No ascites is present.

## 2016-12-03 MED ORDER — FUROSEMIDE 40 MG PO TABS
20.0000 mg | ORAL_TABLET | Freq: Once | ORAL | Status: AC
  Administered 2016-12-03: 20 mg via ORAL
  Filled 2016-12-03: qty 1

## 2016-12-03 MED ORDER — FUROSEMIDE 20 MG PO TABS: 20.0000 mg | ORAL_TABLET | Freq: Every day | ORAL | 11 refills | Status: DC

## 2016-12-03 MED ORDER — TRAMADOL HCL 50 MG PO TABS: 50.0000 mg | ORAL_TABLET | Freq: Four times a day (QID) | ORAL | 0 refills | Status: DC | PRN

## 2016-12-03 MED ORDER — TRAMADOL HCL 50 MG PO TABS
50.0000 mg | ORAL_TABLET | Freq: Once | ORAL | Status: AC
  Administered 2016-12-03: 50 mg via ORAL
  Filled 2016-12-03: qty 1

## 2016-12-03 NOTE — ED Notes (Signed)
Also having some leg swelling and thinks her abd is swelling  These sxs' also started about 2 weeks ago

## 2016-12-03 NOTE — ED Provider Notes (Signed)
[facility] Emergency Department Provider Note   ____________________________________________   First MD Initiated Contact with Patient 12/03/16 [phone]     (approximate)  I have reviewed the triage vital signs and the nursing notes.   HISTORY  Chief Complaint Foot Pain    HPI Teresa Meza is a 36 y.o. female patient complaining of left foot pain is edema. Patient states she was bitten by an insect approximately 2 weeks ago. Patient states she still increased swelling to the right toe aspirated to the foot. Patient also complaining of upper extremity edema. Patient denies any shortness of breath or chest pain. Patient state in the past 2 months that than increased abdominal distention. Patient state distention is mild shortness of breath when she sits up. Patient denies any fever associated this complaint. Patient is rating the pain as a 9/10. Patient described pain as "achy". No palliative measures for complaint.   History reviewed. No pertinent past medical history.  There are no active problems to display for this patient.   Past Surgical History:  Procedure Laterality Date  . ECTOPIC PREGNANCY SURGERY      Prior to Admission medications   Medication Sig Start Date End Date Taking? Authorizing Provider  furosemide (LASIX) 20 MG tablet Take 1 tablet (20 mg total) by mouth daily. 12/03/16 12/03/17  Joni Reining, PA-C  norgestimate-ethinyl estradiol (ORTHO-CYCLEN,SPRINTEC,PREVIFEM) 0.25-35 MG-MCG tablet Take 1 tablet by mouth daily.    [provider]  predniSONE (DELTASONE) 50 MG tablet Take 1 tablet (50 mg total) by mouth daily. 2 tabs po daily x 4 days 01/29/16   Charlynne Pander, MD  traMADol (ULTRAM) 50 MG tablet Take 1 tablet (50 mg total) by mouth every 6 (six) hours as needed for moderate pain. 12/03/16   Joni Reining, PA-C    Allergies Sulfa antibiotics  No family history on file.  Social History Social History  Substance  Use Topics  . Smoking status: Never Smoker  . Smokeless tobacco: Never Used  . Alcohol use No    Review of Systems  Constitutional: No fever/chills Eyes: No visual changes. ENT: No sore throat. Cardiovascular: Denies chest pain. Respiratory: Denies shortness of breath. Gastrointestinal: Abdominal pain with distention..  No nausea, no vomiting.  No diarrhea.  No constipation. Genitourinary: Negative for dysuria. Musculoskeletal: Left foot pain . Skin: Negative for rash. Neurological: Negative for headaches, focal weakness or numbness.  Allergic/Immunilogical: Sulfa antibiotics  ____________________________________________   PHYSICAL EXAM:  VITAL SIGNS: ED Triage Vitals  Enc Vitals Group     BP --      Pulse Rate 12/03/16 0841 79     Resp 12/03/16 0841 20     Temp 12/03/16 0841 98.2 F (36.8 C)     Temp Source 12/03/16 0841 Oral     SpO2 12/03/16 0841 97 %     Weight 12/03/16 0842 205 lb (93 kg)     Height 12/03/16 0842 5\' 3"  (1.6 m)     Head Circumference --      Peak Flow --      Pain Score 12/03/16 0841 9     Pain Loc --      Pain Edu? --      Excl. in GC? --     Constitutional: Alert and oriented. Well appearing and in no acute distress. Eyes: Conjunctivae are normal. PERRL. EOMI. Head: Atraumatic. Nose: No congestion/rhinnorhea. Mouth/Throat: Mucous membranes are moist.  Oropharynx non-erythematous. Neck: No stridor.  No cervical spine tenderness  to palpation. Hematological/Lymphatic/Immunilogical: No cervical lymphadenopathy. Cardiovascular: Normal rate, regular rhythm. Grossly normal heart sounds.  Good peripheral circulation. Respiratory: Normal respiratory effort.  No retractions. Lungs CTAB. Gastrointestinal: Soft and nontender.Mild distention. No abdominal bruits. No CVA tenderness. Musculoskeletal: No lower extremity tenderness nor edema.  Bilateral peripheral edema . Neurologic:  Normal speech and language. No gross focal neurologic deficits are  appreciated. No gait instability. Skin:  Skin is warm, dry and intact. No rash noted. Psychiatric: Mood and affect are normal. Speech and behavior are normal.  ____________________________________________   LABS (all labs ordered are listed, but only abnormal results are displayed)  Labs Reviewed  BASIC METABOLIC PANEL - Abnormal; Notable for the following:       Result Value   Glucose, Bld 109 (*)    Calcium 8.5 (*)    Anion gap 4 (*)    All other components within normal limits  URINALYSIS, ROUTINE W REFLEX MICROSCOPIC - Abnormal; Notable for the following:    Color, Urine YELLOW (*)    APPearance CLEAR (*)    Hgb urine dipstick LARGE (*)    Squamous Epithelial / LPF 0-5 (*)    All other components within normal limits  CBC WITH DIFFERENTIAL/PLATELET  POC URINE PREG, ED   ____________________________________________  EKG   ____________________________________________  RADIOLOGY  X-rays the left foot was unremarkable. Ultrasound of the abdomen negative for ascites. ____________________________________________   PROCEDURES  Procedure(s) performed: None  Procedures  Critical Care performed: No  ____________________________________________   INITIAL IMPRESSION / ASSESSMENT AND PLAN / ED COURSE  Pertinent labs & imaging results that were available during my care of the patient were reviewed by me and considered in my medical decision making (see chart for details).  Peripheral edema. Discuss negative x-ray, labs and ultrasound findings with patient. Patient given discharge care instructions and advised follow-up family doctor for continued care.      ____________________________________________   FINAL CLINICAL IMPRESSION(S) / ED DIAGNOSES  Final diagnoses:  Edema, peripheral      NEW MEDICATIONS STARTED DURING THIS VISIT:  New Prescriptions   FUROSEMIDE (LASIX) 20 MG TABLET    Take 1 tablet (20 mg total) by mouth daily.   TRAMADOL (ULTRAM) 50 MG  TABLET    Take 1 tablet (50 mg total) by mouth every 6 (six) hours as needed for moderate pain.     Note:  This document was prepared using Dragon voice recognition software and may include unintentional dictation errors.    Joni ReiningSmith, Ronald K, PA-C 12/03/16 1145    Don PerkingVeronese, [place], MD 12/04/16 [phone]

## 2016-12-03 NOTE — ED Triage Notes (Signed)
Pt to ed with c/o insect bite to left foot first toe about 1 1/2 weeks ago.  Small amount of swelling noted to big toe.

## 2016-12-03 NOTE — ED Notes (Signed)
See triage note   States possible insect bite to left great toe  Min swelling noted and having increased pain   Ambulates with slight limp

## 2016-12-03 NOTE — ED Notes (Signed)
Urine preg negative

## 2017-02-27 IMAGING — US US OB COMP LESS 14 WK
1 series · 14 of 28 positions shown · non-contrast
Comparison: None.

CLINICAL DATA: Abdominal pain for 1 month

EXAM:
OBSTETRIC <14 WK US AND TRANSVAGINAL OB US
TECHNIQUE: Both transabdominal and transvaginal ultrasound examinations were
performed for complete evaluation of the gestation as well as the
maternal uterus, adnexal regions, and pelvic cul-de-sac.
Transvaginal technique was performed to assess early pregnancy.

[Series 1: us ob comp less 14 wk · 0.12mm/px · 14 of 86 slices shown]
[im 4/86]
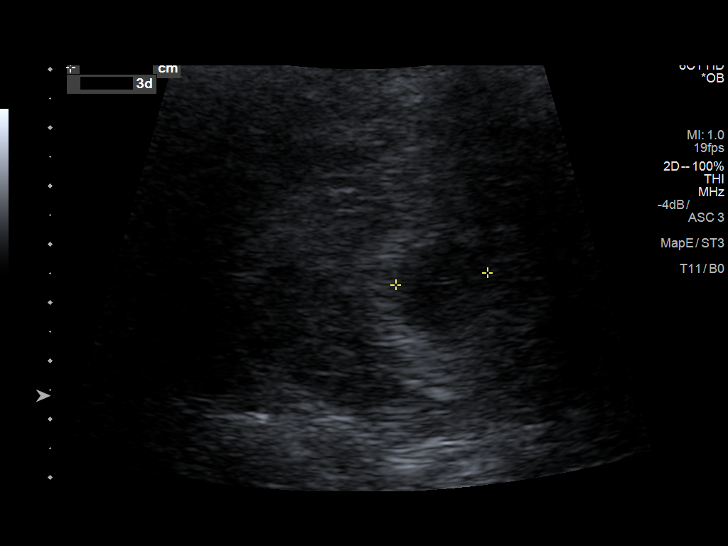
[im 10/86]
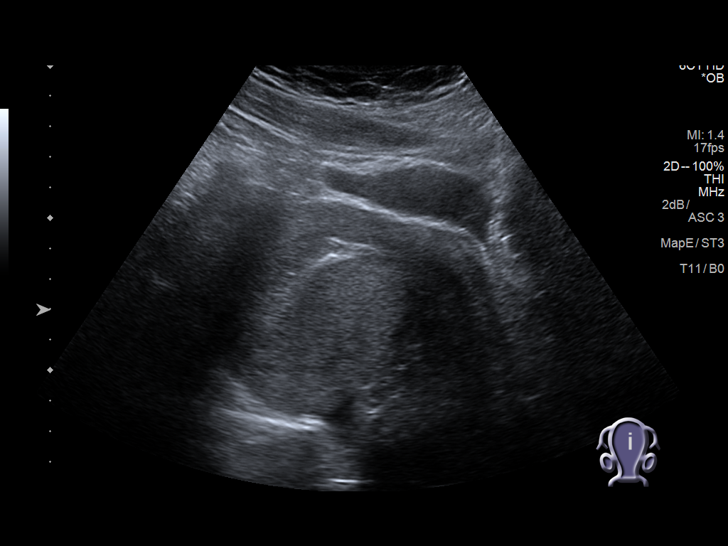
[im 16/86]
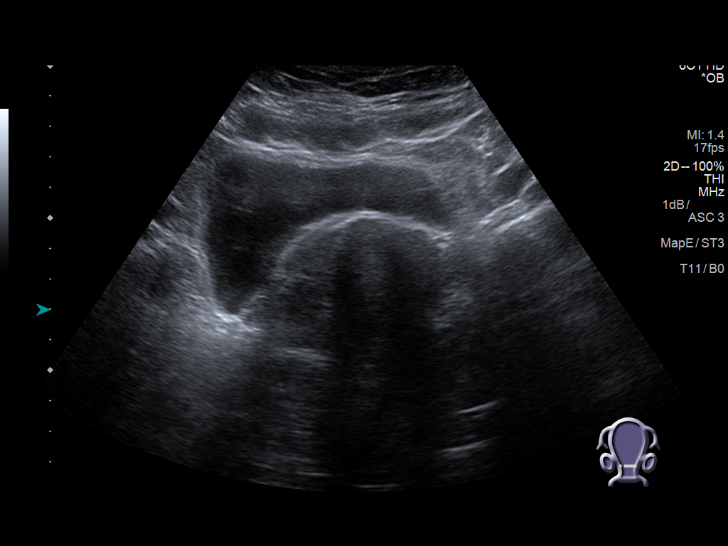
[im 23/86]
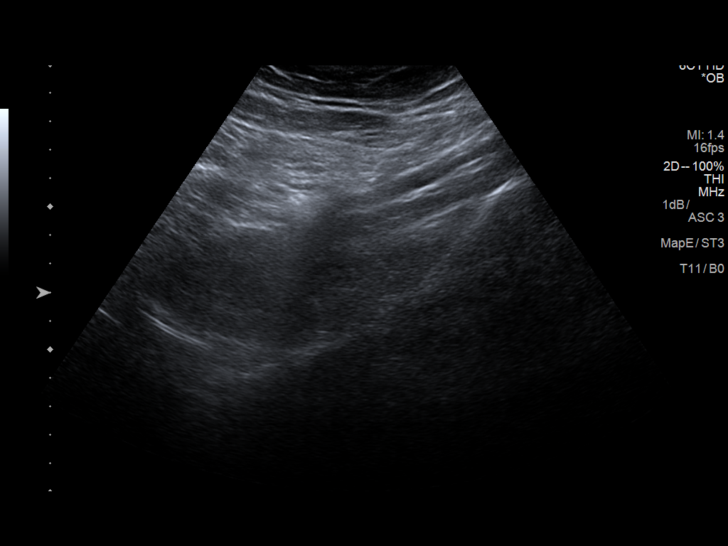
[im 29/86]
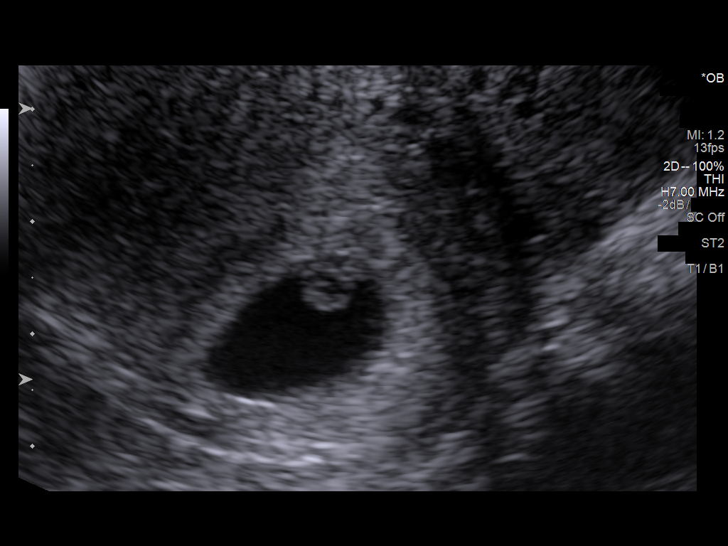
[im 35/86]
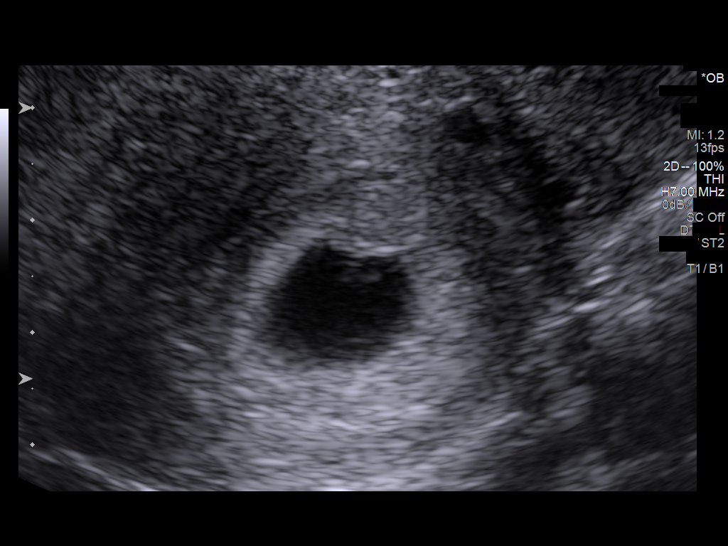
[im 41/86]
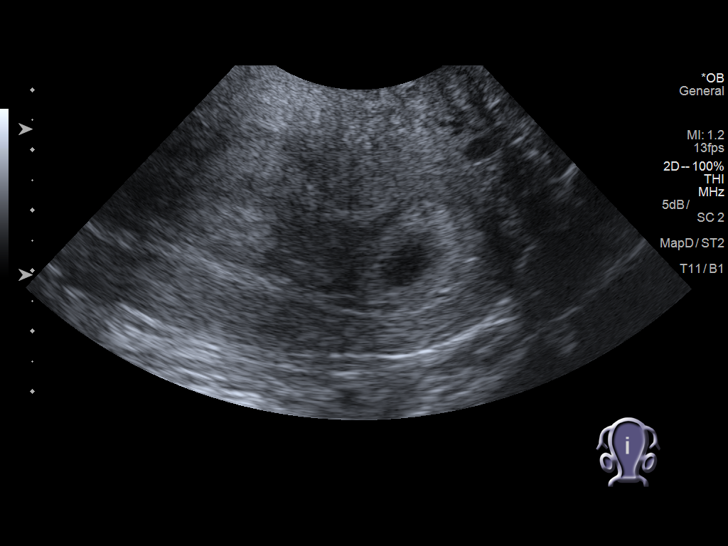
[im 48/86]
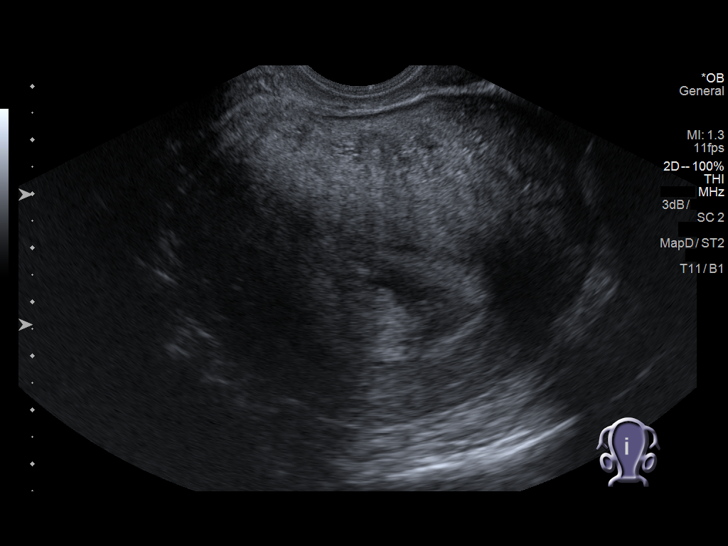
[im 54/86]
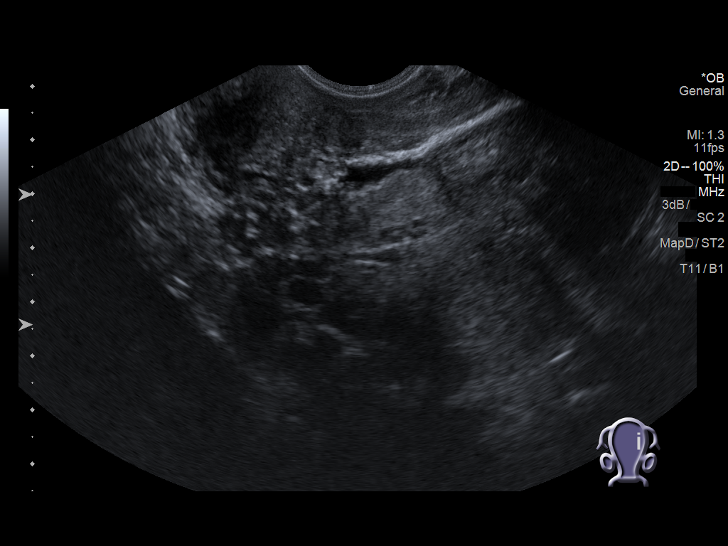
[im 60/86]
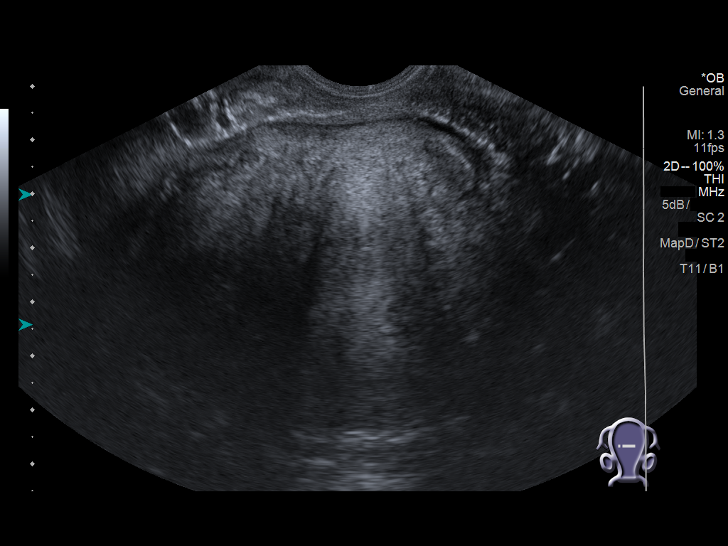
[im 67/86]
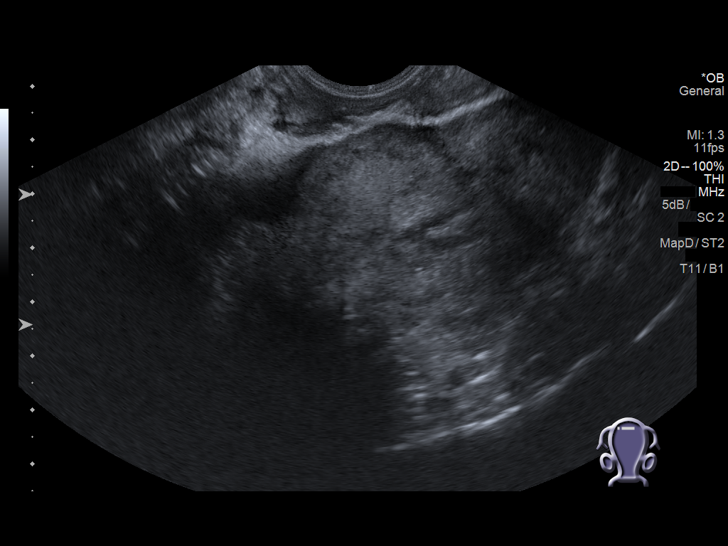
[im 73/86]
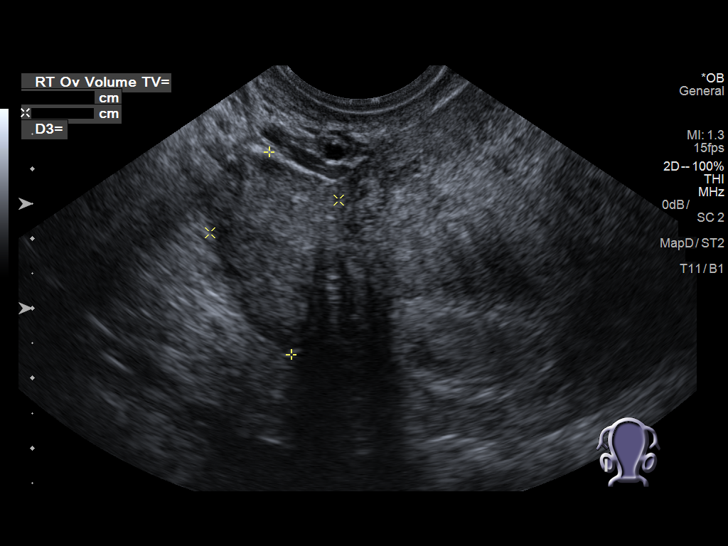
[im 79/86]
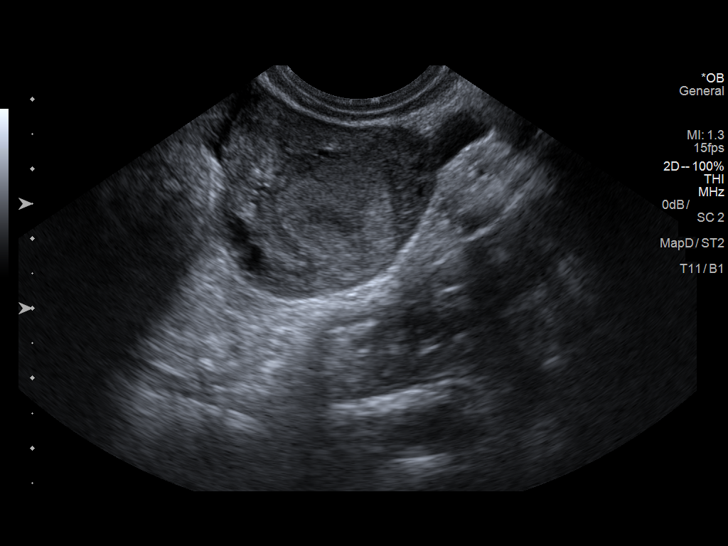
[im 86/86]
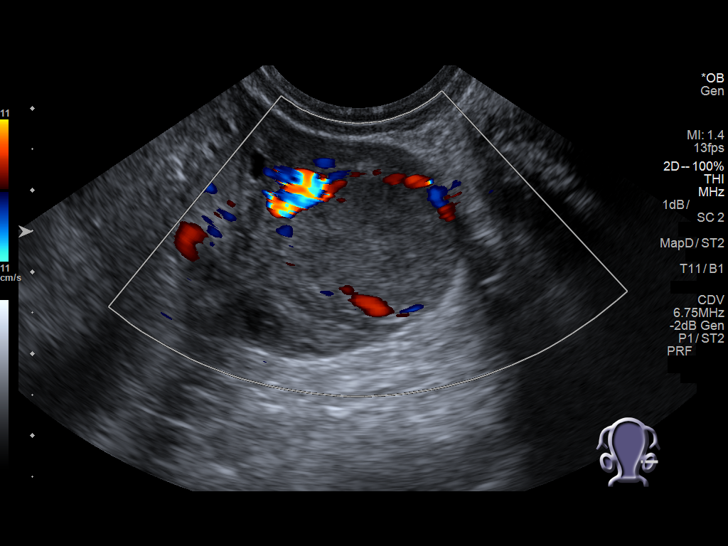

[14 of 28 positions shown; findings below may reference images not displayed]

FINDINGS: Intrauterine gestational sac: Visible

Yolk sac:  Visible

Embryo:  Visible

Cardiac Activity: Visible

Heart Rate: 123  bpm

CRL:  5.6  mm   6 w   2 d                  US EDC: [DATE]

Subchorionic hemorrhage: Small subchorionic hemorrhage along the
right aspect of the sac measuring 1.6 x 0.7 x 1.6 cm

Maternal uterus/adnexae: Ovaries are within normal limits. The right
ovary measures 2.9 x 1.9 x 1.9 cm the left ovary measures 2.6 x
x 3.2 cm. Echogenic area in the left ovary measuring 2.2 cm likely
representing corpus luteal body. Trace free fluid
IMPRESSION: 1. Single viable intrauterine pregnancy as above.
2. Small subchorionic hemorrhage
3. Trace free fluid in the pelvis

## 2017-07-14 ENCOUNTER — Emergency Department: Payer: Self-pay

## 2017-07-14 ENCOUNTER — Encounter: Payer: Self-pay | Admitting: Emergency Medicine

## 2017-07-14 ENCOUNTER — Emergency Department
Admission: EM | Admit: 2017-07-14 | Discharge: 2017-07-14 | Disposition: A | Discharge status: Home / Self Care | Payer: Self-pay | Attending: Emergency Medicine | Admitting: Emergency Medicine

## 2017-07-14 DIAGNOSIS — Z3A01 Less than 8 weeks gestation of pregnancy: Secondary | ICD-10-CM | POA: Insufficient documentation

## 2017-07-14 DIAGNOSIS — O9989 Other specified diseases and conditions complicating pregnancy, childbirth and the puerperium: Secondary | ICD-10-CM | POA: Insufficient documentation

## 2017-07-14 DIAGNOSIS — R1030 Lower abdominal pain, unspecified: Secondary | ICD-10-CM | POA: Insufficient documentation

## 2017-07-14 DIAGNOSIS — Z3491 Encounter for supervision of normal pregnancy, unspecified, first trimester: Secondary | ICD-10-CM

## 2017-07-14 LAB — HCG, QUANTITATIVE, PREGNANCY: HCG, BETA CHAIN, QUANT, S: 38968 m[IU]/mL — AB (ref <5)

## 2017-07-14 LAB — LIPASE, BLOOD: Lipase: 40 U/L (ref 11–51)

## 2017-07-14 LAB — URINALYSIS, COMPLETE (UACMP) WITH MICROSCOPIC
BACTERIA UA: NONE SEEN
BILIRUBIN URINE: NEGATIVE
Glucose, UA: NEGATIVE mg/dL
Hgb urine dipstick: NEGATIVE
Ketones, ur: NEGATIVE mg/dL
NITRITE: NEGATIVE
PH: 6 (ref 5.0–8.0)
Protein, ur: NEGATIVE mg/dL
SPECIFIC GRAVITY, URINE: 1.02 (ref 1.005–1.030)

## 2017-07-14 LAB — CBC
HEMATOCRIT: 40.8 % (ref 35.0–47.0)
Hemoglobin: 13.6 g/dL (ref 12.0–16.0)
MCH: 28.5 pg (ref 26.0–34.0)
MCHC: 33.4 g/dL (ref 32.0–36.0)
MCV: 85.5 fL (ref 80.0–100.0)
PLATELETS: 261 10*3/uL (ref 150–440)
RBC: 4.77 MIL/uL (ref 3.80–5.20)
RDW: 13.3 % (ref 11.5–14.5)
WBC: 8.1 10*3/uL (ref 3.6–11.0)

## 2017-07-14 LAB — COMPREHENSIVE METABOLIC PANEL
ALBUMIN: 3.6 g/dL (ref 3.5–5.0)
ALK PHOS: 71 U/L (ref 38–126)
ALT: 12 U/L — AB (ref 14–54)
AST: 18 U/L (ref 15–41)
Anion gap: 4 — ABNORMAL LOW (ref 5–15)
BILIRUBIN TOTAL: 0.5 mg/dL (ref 0.3–1.2)
BUN: 11 mg/dL (ref 6–20)
CO2: 25 mmol/L (ref 22–32)
CREATININE: 0.79 mg/dL (ref 0.44–1.00)
Calcium: 8.9 mg/dL (ref 8.9–10.3)
Chloride: 103 mmol/L (ref 101–111)
GFR calc Af Amer: >60 mL/min (ref >60)
GLUCOSE: 140 mg/dL — AB (ref 65–99)
Potassium: 3.5 mmol/L (ref 3.5–5.1)
Sodium: 132 mmol/L — ABNORMAL LOW (ref 135–145)
TOTAL PROTEIN: 7.9 g/dL (ref 6.5–8.1)

## 2017-07-14 LAB — ABO/RH: ABO/RH(D): B POS

## 2017-07-14 LAB — POCT PREGNANCY, URINE: Preg Test, Ur: POSITIVE — AB

## 2017-07-14 MED ORDER — ACETAMINOPHEN 500 MG PO TABS
ORAL_TABLET | ORAL | Status: AC
  Filled 2017-07-14: qty 2

## 2017-07-14 MED ORDER — ACETAMINOPHEN 500 MG PO TABS
1000.0000 mg | ORAL_TABLET | ORAL | Status: AC
  Administered 2017-07-14: 1000 mg via ORAL
  Filled 2017-07-14: qty 2

## 2017-07-14 NOTE — Discharge Instructions (Signed)
Please follow up closely with obstetrics and gynecology Dr. Valentino Saxonherry.  Return to the emergency room if have bleedyou become weak and dizzy or lightheaded, you have an episode of passing out,  develop worsening or severe abdominal or pelvic pain, fevers chills or other new concerns arise.

## 2017-07-14 NOTE — ED Provider Notes (Signed)
[facility] Emergency Department Provider Note   ____________________________________________   First MD Initiated Contact with Patient 07/14/17 2007     (approximate)  I have reviewed the triage vital signs and the nursing notes.   HISTORY  Chief Complaint Abdominal Pain    HPI Teresa Meza is a 36 y.o. female reports she realized she was pregnant on Friday.  She been having some breast tenderness, also some lower back discomfort and lower abdominal pain for about a week now.  No vaginal bleeding or discharge.  No nausea or vomiting.  No weakness.  Some very minimal lightheadedness off and on for about a week.  She has not used any fertility treatments, currently not taking any medications.  She does report that massaging the back does relieve some of her symptoms.  No pain or burning with urination.  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  She has had 2 normal vaginal deliveries since that time without complication.  History reviewed. No pertinent past medical history. Heterotopic pregnancy There are no active problems to display for this patient.   Past Surgical History:  Procedure Laterality Date  . ECTOPIC PREGNANCY SURGERY      Takes no medication  Allergies Sulfa antibiotics  No family history on file.  Social History Social History   Tobacco Use  . Smoking status: Never Smoker  . Smokeless tobacco: Never Used  Substance Use Topics  . Alcohol use: No  . Drug use: No    Review of Systems Constitutional: No fever/chills Eyes: No visual changes. ENT: No sore throat. Cardiovascular: Denies chest pain. Respiratory: Denies shortness of breath. Gastrointestinal: No nausea, no vomiting.  No diarrhea.  No constipation. Genitourinary: Negative for dysuria. Musculoskeletal: Negative for back pain. Skin: Negative for rash. Neurological:  Negative for headaches, focal weakness or numbness.    ____________________________________________   PHYSICAL EXAM:  VITAL SIGNS: ED Triage Vitals [07/14/17 1703]  Enc Vitals Group     BP 117/83     Pulse Rate 96     Resp 15     Temp 98 F (36.7 C)     Temp Source Oral     SpO2 97 %     Weight 200 lb (90.7 kg)     Height 5\' 3"  (1.6 m)     Head Circumference      Peak Flow      Pain Score 8     Pain Loc      Pain Edu?      Excl. in GC?     Constitutional: Alert and oriented. Well appearing and in no acute distress. Eyes: Conjunctivae are normal. Head: Atraumatic. Nose: No congestion/rhinnorhea. Mouth/Throat: Mucous membranes are moist. Neck: No stridor.   Cardiovascular: Normal rate, regular rhythm. Grossly normal heart sounds.  Good peripheral circulation. Respiratory: Normal respiratory effort.  No retractions. Lungs CTAB. Gastrointestinal: Soft and nontender with some minimal tenderness suprapubically, negative Rovsing.  No focal pain to McBurney's point.  No rebound guarding or peritonitis. No distention. Musculoskeletal: No lower extremity tenderness nor edema. Neurologic:  Normal speech and language. No gross focal neurologic deficits are appreciated.  Skin:  Skin is warm, dry and intact. No rash noted. Psychiatric: Mood and affect are normal. Speech and behavior are normal.  ____________________________________________   LABS (all labs ordered are listed, but only abnormal results are displayed)  Labs Reviewed  COMPREHENSIVE METABOLIC PANEL -  Abnormal; Notable for the following components:      Result Value   Sodium 132 (*)    Glucose, Bld 140 (*)    ALT 12 (*)    Anion gap 4 (*)    All other components within normal limits  URINALYSIS, COMPLETE (UACMP) WITH MICROSCOPIC - Abnormal; Notable for the following components:   Color, Urine YELLOW (*)    APPearance HAZY (*)    Leukocytes, UA TRACE (*)    Squamous Epithelial / LPF 6-30 (*)    All other  components within normal limits  HCG, QUANTITATIVE, PREGNANCY - Abnormal; Notable for the following components:   hCG, Beta Chain, Quant, S 96,045 (*)    All other components within normal limits  POCT PREGNANCY, URINE - Abnormal; Notable for the following components:   Preg Test, Ur POSITIVE (*)    All other components within normal limits  LIPASE, BLOOD  CBC  POC URINE PREG, ED  ABO/RH   ____________________________________________  EKG   ____________________________________________  RADIOLOGY  US Ob Comp Less 14 Wks  Result Date: 07/14/2017 CLINICAL DATA:  Abdominal pain for 1 month EXAM: OBSTETRIC <14 WK [place] AND TRANSVAGINAL OB US TECHNIQUE: Both transabdominal and transvaginal ultrasound examinations were performed for complete evaluation of the gestation as well as the maternal uterus, adnexal regions, and pelvic cul-de-sac. Transvaginal technique was performed to assess early pregnancy. COMPARISON:  None. FINDINGS: Intrauterine gestational sac: Visible Yolk sac:  Visible Embryo:  Visible Cardiac Activity: Visible Heart Rate: 123  bpm CRL:  5.6  mm   6 w   2 d                  [place] EDC: 03/07/2018 Subchorionic hemorrhage: Small subchorionic hemorrhage along the right aspect of the sac measuring 1.6 x 0.7 x 1.6 cm Maternal uterus/adnexae: Ovaries are within normal limits. The right ovary measures 2.9 x 1.9 x 1.9 cm the left ovary measures 2.6 x 2.9 x 3.2 cm. Echogenic area in the left ovary measuring 2.2 cm likely representing corpus luteal body. Trace free fluid IMPRESSION: 1. Single viable intrauterine pregnancy as above. 2. Small subchorionic hemorrhage 3. Trace free fluid in the pelvis Electronically Signed   By: Jasmine Pang M.D.   On: 07/14/2017 19:25   US Ob Transvaginal  Result Date: 07/14/2017 CLINICAL DATA:  Abdominal pain for 1 month EXAM: OBSTETRIC <14 WK [place] AND TRANSVAGINAL OB US TECHNIQUE: Both transabdominal and transvaginal ultrasound examinations were performed for  complete evaluation of the gestation as well as the maternal uterus, adnexal regions, and pelvic cul-de-sac. Transvaginal technique was performed to assess early pregnancy. COMPARISON:  None. FINDINGS: Intrauterine gestational sac: Visible Yolk sac:  Visible Embryo:  Visible Cardiac Activity: Visible Heart Rate: 123  bpm CRL:  5.6  mm   6 w   2 d                  [place] EDC: 03/07/2018 Subchorionic hemorrhage: Small subchorionic hemorrhage along the right aspect of the sac measuring 1.6 x 0.7 x 1.6 cm Maternal uterus/adnexae: Ovaries are within normal limits. The right ovary measures 2.9 x 1.9 x 1.9 cm the left ovary measures 2.6 x 2.9 x 3.2 cm. Echogenic area in the left ovary measuring 2.2 cm likely representing corpus luteal body. Trace free fluid IMPRESSION: 1. Single viable intrauterine pregnancy as above. 2. Small subchorionic hemorrhage 3. Trace free fluid in the pelvis Electronically Signed   By: Adrian Prows.D.  On: 07/14/2017 19:25    Ultrasound results reviewed, positive intrauterine pregnancy, small subchorionic hemorrhage.  Trace free fluid pelvis ____________________________________________   PROCEDURES  Procedure(s) performed: None  Procedures  Critical Care performed: No  ____________________________________________   INITIAL IMPRESSION / ASSESSMENT AND PLAN / ED COURSE  Pertinent labs & imaging results that were available during my care of the patient were reviewed by me and considered in my medical decision making (see chart for details).  Patient returns for evaluation of lower abdominal discomfort with breast tenderness and reports noting pregnancy on Friday.  She does report her last menstrual cycle was a little unclear but she believes it occurred at some point in September.  She has a history of heterotopic, her examination labs and vital signs extremely reassuring with no evidence support ectopic pregnancy at this time.  Positive intrauterine pregnancy on ultrasound,  Rh+ with a small subchorionic hemorrhage.  She denies any vaginal discharge or bleeding.  No fevers.  No symptoms of peritonitis or evidence of acute abdomen, I clinically doubt that this would represent appendicitis, cholecystitis or other acute intra-abdominal process.  Urine no evidence of infection  Clinical Course as of Jul 15 2111  Mon Jul 14, 2017  2104 Discussed with Dr. Valentino Saxonherry, advises discharge and follow-up in her clinic. Patient agreeable.   [MQ]    Clinical Course User Index [MQ] Sharyn CreamerQuale, Mark, MD   ----------------------------------------- 9:15 PM on 07/14/2017 -----------------------------------------  Patient ambulating, no distress.  Comfortable with plan for discharge and careful return precautions and follow-up with gynecology  Return precautions and treatment recommendations and follow-up discussed with the patient who is agreeable with the plan.   ____________________________________________   FINAL CLINICAL IMPRESSION(S) / ED DIAGNOSES  Final diagnoses:  Lower abdominal pain  First trimester pregnancy      NEW MEDICATIONS STARTED DURING THIS VISIT:  This SmartLink is deprecated. Use AVSMEDLIST instead to display the medication list for a patient.   Note:  This document was prepared using Dragon voice recognition software and may include unintentional dictation errors.     Sharyn CreamerQuale, Mark, MD 07/14/17 2115

## 2017-07-14 NOTE — ED Notes (Signed)
topez not working 

## 2017-07-14 NOTE — ED Triage Notes (Signed)
Patient presents to ED via POV from home with c/o lower abdominal pain. Patient reports she is pregnant but not sure how far along she is. Hx of ectopic pregnancy in 2006. Patient also reports lower back pain.

## 2017-07-14 NOTE — ED Notes (Signed)
Pt dropped 1,000 mg tablets of tylenol on floor. Had to override for 1,000 more mg of tylenol.

## 2017-07-14 NOTE — ED Triage Notes (Signed)
First Nurse Note:  Arrives with c/o lower abdominal pain, right side pain, and back pain x 1 month.  States took a home pregnancy test last week, which was positive.  Patient states LMP was approximately 3 months ago.  Patient is G4 P2 with 2 live births, one ectopic pregnancy.

## 2017-07-22 NOTE — L&D Delivery Note (Signed)
Date of delivery: 02/14/18 Estimated Date of Delivery: 03/07/18 Patient's last menstrual period was 05/14/2017. EGA: [id number]  Delivery Note At 10:55 PM a viable  female was delivered via Vaginal, Spontaneous (Presentation: cephalic LOA).  APGAR: 9, 9; weight  Pending .   Placenta status:spontaneous, intact, circumvillate and marginal cord insertion.  Cord: 3vv with the following complications: none apparent .  Cord pH: not collected  Anesthesia:  epidural Episiotomy:  no Lacerations:  1st degree Suture Repair: 3.0 vicryl rapide Est. Blood Loss (mL): 420cc (measured)  Mom presented to L&D with IOL for preeclampsia at term. She was given cytotec, augmented with pitocin, and AROM'd.  epidual placed. Progressed to complete, second stage: <335min with delivery of fetal head with restitution to LOt   Anterior then posterior shoulders delivered without difficulty.  Baby placed on mom's chest, and attended to by peds.  Cord was then clamped and cut after a >60sec delay by FOB.  Placenta spontaneously delivered, intact.  IV pitocin given for hemorrhage prophylaxis. 1st degree laceration repaired in standard fashion with 3-0 vicryl rapide.   We sang happy birthday to baby Teresa Meza.   Mom to postpartum.  Baby to Couplet care / Skin to Skin.  Teresa Meza 02/14/2018, 11:21 PM

## 2017-08-18 ENCOUNTER — Encounter: Payer: Self-pay | Admitting: Obstetrics & Gynecology

## 2017-08-26 ENCOUNTER — Encounter: Payer: Self-pay | Admitting: Obstetrics and Gynecology

## 2017-08-26 ENCOUNTER — Ambulatory Visit (INDEPENDENT_AMBULATORY_CARE_PROVIDER_SITE_OTHER): Payer: Medicaid Other | Admitting: Obstetrics and Gynecology

## 2017-08-26 VITALS — BP 128/88 | Wt 212.0 lb

## 2017-08-26 DIAGNOSIS — O09299 Supervision of pregnancy with other poor reproductive or obstetric history, unspecified trimester: Secondary | ICD-10-CM | POA: Insufficient documentation

## 2017-08-26 DIAGNOSIS — O09529 Supervision of elderly multigravida, unspecified trimester: Secondary | ICD-10-CM

## 2017-08-26 DIAGNOSIS — Z8632 Personal history of gestational diabetes: Secondary | ICD-10-CM

## 2017-08-26 DIAGNOSIS — O09522 Supervision of elderly multigravida, second trimester: Secondary | ICD-10-CM

## 2017-08-26 DIAGNOSIS — O09521 Supervision of elderly multigravida, first trimester: Secondary | ICD-10-CM

## 2017-08-26 DIAGNOSIS — O0990 Supervision of high risk pregnancy, unspecified, unspecified trimester: Secondary | ICD-10-CM

## 2017-08-26 HISTORY — DX: Supervision of high risk pregnancy, unspecified, unspecified trimester: O09.90

## 2017-08-26 HISTORY — DX: Supervision of elderly multigravida, unspecified trimester: O09.529

## 2017-08-26 HISTORY — DX: Supervision of pregnancy with other poor reproductive or obstetric history, unspecified trimester: O09.299

## 2017-08-26 HISTORY — DX: Personal history of gestational diabetes: Z86.32

## 2017-08-26 NOTE — Patient Instructions (Signed)
First Trimester of Pregnancy The first trimester of pregnancy is from week 1 until the end of week 13 (months 1 through 3). During this time, your baby will begin to develop inside you. At 6-8 weeks, the eyes and face are formed, and the heartbeat can be seen on ultrasound. At the end of 12 weeks, all the baby's organs are formed. Prenatal care is all the medical care you receive before the birth of your baby. Make sure you get good prenatal care and follow all of your doctor's instructions. Follow these instructions at home: Medicines  Take over-the-counter and prescription medicines only as told by your doctor. Some medicines are safe and some medicines are not safe during pregnancy.  Take a prenatal vitamin that contains at least 600 micrograms (mcg) of folic acid.  If you have trouble pooping (constipation), take medicine that will make your stool soft (stool softener) if your doctor approves. Eating and drinking  Eat regular, healthy meals.  Your doctor will tell you the amount of weight gain that is right for you.  Avoid raw meat and uncooked cheese.  If you feel sick to your stomach (nauseous) or throw up (vomit): ? Eat 4 or 5 small meals a day instead of 3 large meals. ? Try eating a few soda crackers. ? Drink liquids between meals instead of during meals.  To prevent constipation: ? Eat foods that are high in fiber, like fresh fruits and vegetables, whole grains, and beans. ? Drink enough fluids to keep your pee (urine) clear or pale yellow. Activity  Exercise only as told by your doctor. Stop exercising if you have cramps or pain in your lower belly (abdomen) or low back.  Do not exercise if it is too hot, too humid, or if you are in a place of great height (high altitude).  Try to avoid standing for long periods of time. Move your legs often if you must stand in one place for a long time.  Avoid heavy lifting.  Wear low-heeled shoes. Sit and stand up straight.  You  can have sex unless your doctor tells you not to. Relieving pain and discomfort  Wear a good support bra if your breasts are sore.  Take warm water baths (sitz baths) to soothe pain or discomfort caused by hemorrhoids. Use hemorrhoid cream if your doctor says it is okay.  Rest with your legs raised if you have leg cramps or low back pain.  If you have puffy, bulging veins (varicose veins) in your legs: ? Wear support hose or compression stockings as told by your doctor. ? Raise (elevate) your feet for 15 minutes, 3-4 times a day. ? Limit salt in your food. Prenatal care  Schedule your prenatal visits by the twelfth week of pregnancy.  Write down your questions. Take them to your prenatal visits.  Keep all your prenatal visits as told by your doctor. This is important. Safety  Wear your seat belt at all times when driving.  Make a list of emergency phone numbers. The list should include numbers for family, friends, the hospital, and police and fire departments. General instructions  Ask your doctor for a referral to a local prenatal class. Begin classes no later than at the start of month 6 of your pregnancy.  Ask for help if you need counseling or if you need help with nutrition. Your doctor can give you advice or tell you where to go for help.  Do not use hot tubs, steam rooms, or   saunas.  Do not douche or use tampons or scented sanitary pads.  Do not cross your legs for long periods of time.  Avoid all herbs and alcohol. Avoid drugs that are not approved by your doctor.  Do not use any tobacco products, including cigarettes, chewing tobacco, and electronic cigarettes. If you need help quitting, ask your doctor. You may get counseling or other support to help you quit.  Avoid cat litter boxes and soil used by cats. These carry germs that can cause birth defects in the baby and can cause a loss of your baby (miscarriage) or stillbirth.  Visit your dentist. At home, brush  your teeth with a soft toothbrush. Be gentle when you floss. Contact a doctor if:  You are dizzy.  You have mild cramps or pressure in your lower belly.  You have a nagging pain in your belly area.  You continue to feel sick to your stomach, you throw up, or you have watery poop (diarrhea).  You have a bad smelling fluid coming from your vagina.  You have pain when you pee (urinate).  You have increased puffiness (swelling) in your face, hands, legs, or ankles. Get help right away if:  You have a fever.  You are leaking fluid from your vagina.  You have spotting or bleeding from your vagina.  You have very bad belly cramping or pain.  You gain or lose weight rapidly.  You throw up blood. It may look like coffee grounds.  You are around people who have [place] measles, fifth disease, or chickenpox.  You have a very bad headache.  You have shortness of breath.  You have any kind of trauma, such as from a fall or a car accident. Summary  The first trimester of pregnancy is from week 1 until the end of week 13 (months 1 through 3).  To take care of yourself and your unborn baby, you will need to eat healthy meals, take medicines only if your doctor tells you to do so, and do activities that are safe for you and your baby.  Keep all follow-up visits as told by your doctor. This is important as your doctor will have to ensure that your baby is healthy and growing well. This information is not intended to replace advice given to you by your health care provider. Make sure you discuss any questions you have with your health care provider. Document Released: 12/25/2007 Document Revised: 07/16/2016 Document Reviewed: 07/16/2016 Elsevier Interactive Patient Education  2017 [employer].   First Trimester of Pregnancy The first trimester of pregnancy is from week 1 until the end of week 13 (months 1 through 3). During this time, your baby will begin to develop inside you. At 6-8  weeks, the eyes and face are formed, and the heartbeat can be seen on ultrasound. At the end of 12 weeks, all the baby's organs are formed. Prenatal care is all the medical care you receive before the birth of your baby. Make sure you get good prenatal care and follow all of your doctor's instructions. Follow these instructions at home: Medicines  Take over-the-counter and prescription medicines only as told by your doctor. Some medicines are safe and some medicines are not safe during pregnancy.  Take a prenatal vitamin that contains at least 600 micrograms (mcg) of folic acid.  If you have trouble pooping (constipation), take medicine that will make your stool soft (stool softener) if your doctor approves. Eating and drinking  Eat regular, healthy meals.  Your doctor will tell you the amount of weight gain that is right for you.  Avoid raw meat and uncooked cheese.  If you feel sick to your stomach (nauseous) or throw up (vomit): ? Eat 4 or 5 small meals a day instead of 3 large meals. ? Try eating a few soda crackers. ? Drink liquids between meals instead of during meals.  To prevent constipation: ? Eat foods that are high in fiber, like fresh fruits and vegetables, whole grains, and beans. ? Drink enough fluids to keep your pee (urine) clear or pale yellow. Activity  Exercise only as told by your doctor. Stop exercising if you have cramps or pain in your lower belly (abdomen) or low back.  Do not exercise if it is too hot, too humid, or if you are in a place of great height (high altitude).  Try to avoid standing for long periods of time. Move your legs often if you must stand in one place for a long time.  Avoid heavy lifting.  Wear low-heeled shoes. Sit and stand up straight.  You can have sex unless your doctor tells you not to. Relieving pain and discomfort  Wear a good support bra if your breasts are sore.  Take warm water baths (sitz baths) to soothe pain or  discomfort caused by hemorrhoids. Use hemorrhoid cream if your doctor says it is okay.  Rest with your legs raised if you have leg cramps or low back pain.  If you have puffy, bulging veins (varicose veins) in your legs: ? Wear support hose or compression stockings as told by your doctor. ? Raise (elevate) your feet for 15 minutes, 3-4 times a day. ? Limit salt in your food. Prenatal care  Schedule your prenatal visits by the twelfth week of pregnancy.  Write down your questions. Take them to your prenatal visits.  Keep all your prenatal visits as told by your doctor. This is important. Safety  Wear your seat belt at all times when driving.  Make a list of emergency phone numbers. The list should include numbers for family, friends, the hospital, and police and fire departments. General instructions  Ask your doctor for a referral to a local prenatal class. Begin classes no later than at the start of month 6 of your pregnancy.  Ask for help if you need counseling or if you need help with nutrition. Your doctor can give you advice or tell you where to go for help.  Do not use hot tubs, steam rooms, or saunas.  Do not douche or use tampons or scented sanitary pads.  Do not cross your legs for long periods of time.  Avoid all herbs and alcohol. Avoid drugs that are not approved by your doctor.  Do not use any tobacco products, including cigarettes, chewing tobacco, and electronic cigarettes. If you need help quitting, ask your doctor. You may get counseling or other support to help you quit.  Avoid cat litter boxes and soil used by cats. These carry germs that can cause birth defects in the baby and can cause a loss of your baby (miscarriage) or stillbirth.  Visit your dentist. At home, brush your teeth with a soft toothbrush. Be gentle when you floss. Contact a doctor if:  You are dizzy.  You have mild cramps or pressure in your lower belly.  You have a nagging pain in your  belly area.  You continue to feel sick to your stomach, you throw up, or you have watery  poop (diarrhea).  You have a bad smelling fluid coming from your vagina.  You have pain when you pee (urinate).  You have increased puffiness (swelling) in your face, hands, legs, or ankles. Get help right away if:  You have a fever.  You are leaking fluid from your vagina.  You have spotting or bleeding from your vagina.  You have very bad belly cramping or pain.  You gain or lose weight rapidly.  You throw up blood. It may look like coffee grounds.  You are around people who have [place] measles, fifth disease, or chickenpox.  You have a very bad headache.  You have shortness of breath.  You have any kind of trauma, such as from a fall or a car accident. Summary  The first trimester of pregnancy is from week 1 until the end of week 13 (months 1 through 3).  To take care of yourself and your unborn baby, you will need to eat healthy meals, take medicines only if your doctor tells you to do so, and do activities that are safe for you and your baby.  Keep all follow-up visits as told by your doctor. This is important as your doctor will have to ensure that your baby is healthy and growing well. This information is not intended to replace advice given to you by your health care provider. Make sure you discuss any questions you have with your health care provider. Document Released: 12/25/2007 Document Revised: 07/16/2016 Document Reviewed: 07/16/2016 Elsevier Interactive Patient Education  2017 [employer].    Second Trimester of Pregnancy The second trimester is from week 13 through week 28, month 4 through 6. This is often the time in pregnancy that you feel your best. Often times, morning sickness has lessened or quit. You may have more energy, and you may get hungry more often. Your unborn baby (fetus) is growing rapidly. At the end of the sixth month, he or she is about 9 inches  long and weighs about 1 pounds. You will likely feel the baby move (quickening) between 18 and 20 weeks of pregnancy. Follow these instructions at home:  Avoid all smoking, herbs, and alcohol. Avoid drugs not approved by your doctor.  Do not use any tobacco products, including cigarettes, chewing tobacco, and electronic cigarettes. If you need help quitting, ask your doctor. You may get counseling or other support to help you quit.  Only take medicine as told by your doctor. Some medicines are safe and some are not during pregnancy.  Exercise only as told by your doctor. Stop exercising if you start having cramps.  Eat regular, healthy meals.  Wear a good support bra if your breasts are tender.  Do not use hot tubs, steam rooms, or saunas.  Wear your seat belt when driving.  Avoid raw meat, uncooked cheese, and liter boxes and soil used by cats.  Take your prenatal vitamins.  Take 1500-2000 milligrams of calcium daily starting at the 20th week of pregnancy until you deliver your baby.  Try taking medicine that helps you poop (stool softener) as needed, and if your doctor approves. Eat more fiber by eating fresh fruit, vegetables, and whole grains. Drink enough fluids to keep your pee (urine) clear or pale yellow.  Take warm water baths (sitz baths) to soothe pain or discomfort caused by hemorrhoids. Use hemorrhoid cream if your doctor approves.  If you have puffy, bulging veins (varicose veins), wear support hose. Raise (elevate) your feet for 15 minutes, 3-4 times  a day. Limit salt in your diet.  Avoid heavy lifting, wear low heals, and sit up straight.  Rest with your legs raised if you have leg cramps or low back pain.  Visit your dentist if you have not gone during your pregnancy. Use a soft toothbrush to brush your teeth. Be gentle when you floss.  You can have sex (intercourse) unless your doctor tells you not to.  Go to your doctor visits. Get help if:  You feel  dizzy.  You have mild cramps or pressure in your lower belly (abdomen).  You have a nagging pain in your belly area.  You continue to feel sick to your stomach (nauseous), throw up (vomit), or have watery poop (diarrhea).  You have bad smelling fluid coming from your vagina.  You have pain with peeing (urination). Get help right away if:  You have a fever.  You are leaking fluid from your vagina.  You have spotting or bleeding from your vagina.  You have severe belly cramping or pain.  You lose or gain weight rapidly.  You have trouble catching your breath and have chest pain.  You notice sudden or extreme puffiness (swelling) of your face, hands, ankles, feet, or legs.  You have not felt the baby move in over an hour.  You have severe headaches that do not go away with medicine.  You have vision changes. This information is not intended to replace advice given to you by your health care provider. Make sure you discuss any questions you have with your health care provider. Document Released: 10/02/2009 Document Revised: 12/14/2015 Document Reviewed: 09/08/2012 Elsevier Interactive Patient Education  2017 [employer]

## 2017-08-26 NOTE — Progress Notes (Signed)
NOB, was seen at ER in 06/2017. Hx of ectopic pregnancy   08/26/2017   Chief Complaint: Missed period  Transfer of Care Patient: no  History of Present Illness: Ms. Teresa Meza is a 37 y.o. [id number] based on Patient's last menstrual period was 05/14/2017. with an Estimated Date of Delivery: 03/07/18, with the above CC.   Her periods were: regular periods every 30 days She was using no method when she conceived.  She has Negative signs or symptoms of nausea/vomiting of pregnancy. She has Negative signs or symptoms of miscarriage or preterm labor She identifies Negative Zika risk factors for her and her partner On any different medications around the time she conceived/early pregnancy: No  History of varicella: Yes   Counseled on the risk of flu, declines vaccination  ROS: A 12-point review of systems was performed and negative, except as stated in the above HPI.  OBGYN History: As per HPI. OB History  Gravida Para Term Preterm AB Living  4 2 2   1 2   SAB TAB Ectopic Multiple Live Births      1   2    # Outcome Date GA Lbr Len/2nd Weight Sex Delivery Anes PTL Lv  4 Current           3 Term 10/06/13   6 lb 6 oz (2.892 kg) F Vag-Spont   LIV  2 Term 05/08/06   7 lb 6 oz (3.345 kg) M Vag-Spont   LIV     Complications: Gestational diabetes  1 Ectopic 2005              Any issues with any prior pregnancies: Hx og gestational diabetes Any prior children are healthy, doing well, without any problems or issues: yes History of pap smears: Yes. Last pap smear unsure  History of STIs: No   Past Medical History: Past Medical History:  Diagnosis Date  . Ectopic fetus     Past Surgical History: Past Surgical History:  Procedure Laterality Date  . ECTOPIC PREGNANCY SURGERY      Family History:  Family History  Problem Relation Age of Onset  . Diabetes Mellitus II Mother    She denies any female cancers, bleeding or blood clotting disorders.  She denies any history of  mental retardation, birth defects or genetic disorders in her or the FOB's history  Social History:  Social History   Socioeconomic History  . Marital status: Married    Spouse name: Not on file  . Number of children: Not on file  . Years of education: Not on file  . Highest education level: Not on file  Social Needs  . Financial resource strain: Not on file  . Food insecurity - worry: Not on file  . Food insecurity - inability: Not on file  . Transportation needs - medical: Not on file  . Transportation needs - non-medical: Not on file  Occupational History  . Not on file  Tobacco Use  . Smoking status: Never Smoker  . Smokeless tobacco: Never Used  Substance and Sexual Activity  . Alcohol use: No  . Drug use: No  . Sexual activity: Yes    Birth control/protection: None  Other Topics Concern  . Not on file  Social History Narrative  . Not on file   Any pets in the household: no No pets in house  Allergy: Allergies  Allergen Reactions  . Sulfa Antibiotics Hives, Itching and Nausea Only    Current Outpatient Medications:  Current Outpatient  Medications:  .  Prenatal Vit-Fe Fumarate-FA (MULTIVITAMIN-PRENATAL) 27-0.8 MG TABS tablet, Take 1 tablet by mouth daily at 12 noon., Disp: , Rfl:  .  furosemide (LASIX) 20 MG tablet, Take 1 tablet (20 mg total) by mouth daily. (Patient not taking: Reported on 08/26/2017), Disp: 10 tablet, Rfl: 11 .  norgestimate-ethinyl estradiol (ORTHO-CYCLEN,SPRINTEC,PREVIFEM) 0.25-35 MG-MCG tablet, Take 1 tablet by mouth daily., Disp: , Rfl:  .  predniSONE (DELTASONE) 50 MG tablet, Take 1 tablet (50 mg total) by mouth daily. 2 tabs po daily x 4 days (Patient not taking: Reported on 08/26/2017), Disp: 5 tablet, Rfl: 0 .  traMADol (ULTRAM) 50 MG tablet, Take 1 tablet (50 mg total) by mouth every 6 (six) hours as needed for moderate pain. (Patient not taking: Reported on 08/26/2017), Disp: 12 tablet, Rfl: 0   Physical Exam:   BP 128/88   Wt 212 lb  (96.2 kg)   LMP 05/14/2017   BMI 37.55 kg/m  Body mass index is 37.55 kg/m. Constitutional: Well nourished, well developed female in no acute distress.  Neck:  Supple, normal appearance, and no thyromegaly  Cardiovascular: S1, S2 normal, no murmur, rub or gallop, regular rate and rhythm Respiratory:  Clear to auscultation bilateral. Normal respiratory effort Abdomen: positive bowel sounds and no masses, hernias; diffusely non tender to palpation, non distended Breasts: breasts appear normal, no suspicious masses, no skin or nipple changes or axillary nodes. Neuro/Psych:  Normal mood and affect.  Skin:  Warm and dry.  Lymphatic:  No inguinal lymphadenopathy.   Pelvic exam: is limited by body habitus EGBUS: within normal limits, Vagina: within normal limits and with no blood in the vault, Cervix: normal appearing cervix without discharge or lesions, closed/long/high, Uterus:  enlarged: 12wk size, and Adnexa:  normal adnexa and no mass, fullness, tenderness  Assessment: Ms. Teresa Meza is a 37 y.o. Z6X0960 [redacted]w[redacted]d based on Patient's last menstrual period was 05/14/2017. with an Estimated Date of Delivery: 03/07/18,  for prenatal care.  Plan:  1) Avoid alcoholic beverages. 2) Patient encouraged not to smoke.  3) Discontinue the use of all non-medicinal drugs and chemicals.  4) Take prenatal vitamins daily.  5) Seatbelt use advised 6) Nutrition, food safety (fish, cheese advisories, and high nitrite foods) and exercise discussed. 7) Hospital and practice style delivering at [facility] discussed  8) Patient is asked about travel to areas at risk for the Zika virus, and counseled to avoid travel and exposure to mosquitoes or sexual partners who may have themselves been exposed to the virus. Testing is discussed, and will be ordered as appropriate.  9) Childbirth classes at [facility] advised 10) Genetic Screening, such as with 1st Trimester Screening, cell free fetal DNA, AFP testing, and Ultrasound, as well  as with amniocentesis and CVS as appropriate, is discussed with patient. She plans to have genetic testing this pregnancy. NIPT drawn today. Declines screening for sickle cell disease 11) Pap smear today 12) Early 1GTT for hx of gestational diabetes, patient to return for this ASAP.  Problem list reviewed and updated.  Adelene Idler MD [facility], [facility] 08/26/2017  12:18 PM

## 2017-08-27 LAB — RPR+RH+ABO+RUB AB+AB SCR+CB...
Antibody Screen: NEGATIVE
HIV SCREEN 4TH GENERATION: NONREACTIVE
Hematocrit: 41.3 % (ref 34.0–46.6)
Hemoglobin: 13.4 g/dL (ref 11.1–15.9)
Hepatitis B Surface Ag: NEGATIVE
MCH: 27.9 pg (ref 26.6–33.0)
MCHC: 32.4 g/dL (ref 31.5–35.7)
MCV: 86 fL (ref 79–97)
PLATELETS: 278 10*3/uL (ref 150–379)
RBC: 4.8 x10E6/uL (ref 3.77–5.28)
RDW: 13.8 % (ref 12.3–15.4)
RPR Ser Ql: NONREACTIVE
RUBELLA: 4.25 {index} (ref >0.99)
Rh Factor: POSITIVE
VARICELLA: 2214 {index} (ref >165)
WBC: 6.7 10*3/uL (ref 3.4–10.8)

## 2017-08-28 LAB — URINE DRUG PANEL 7
AMPHETAMINES, URINE: NEGATIVE ng/mL
BENZODIAZEPINE QUANT UR: NEGATIVE ng/mL
Barbiturate Quant, Ur: NEGATIVE ng/mL
Cannabinoid Quant, Ur: NEGATIVE ng/mL
Cocaine (Metab.): NEGATIVE ng/mL
Opiate Quant, Ur: NEGATIVE ng/mL
PCP QUANT UR: NEGATIVE ng/mL

## 2017-08-28 LAB — URINE CULTURE

## 2017-08-29 LAB — PAPIG, CTNGTV, HPV, RFX 16/18
Chlamydia, Nuc. Acid Amp: NEGATIVE
GONOCOCCUS, NUC. ACID AMP: NEGATIVE
HPV, high-risk: NEGATIVE
PAP Smear Comment: 0
TRICH VAG BY NAA: NEGATIVE

## 2017-08-31 LAB — RPR+RH+ABO+RUB AB+AB SCR+CB...

## 2017-08-31 LAB — MATERNIT 21 PLUS CORE, BLOOD
CHROMOSOME 13: NEGATIVE
CHROMOSOME 18: NEGATIVE
CHROMOSOME 21: NEGATIVE
Y CHROMOSOME: DETECTED

## 2017-09-01 ENCOUNTER — Telehealth: Payer: Self-pay | Admitting: Obstetrics and Gynecology

## 2017-09-01 ENCOUNTER — Other Ambulatory Visit: Payer: Medicaid Other

## 2017-09-01 NOTE — Progress Notes (Signed)
Please call patient with results, gender Linwood DibblesXy! I called and left generic message. Thank you, Dr. Jerene PitchSchuman

## 2017-09-01 NOTE — Progress Notes (Signed)
Called, left generic message to call for results. Results normal.

## 2017-09-01 NOTE — Telephone Encounter (Signed)
Please call.

## 2017-09-01 NOTE — Telephone Encounter (Signed)
Please call patient back at [phone] to give results.  She returned your call.

## 2017-09-01 NOTE — Telephone Encounter (Signed)
Returned call

## 2017-09-01 NOTE — Telephone Encounter (Signed)
Pt is calling due to missed call this morning at 7:30. Pt is wanting to know if she lab result are in . Please advise

## 2017-09-02 ENCOUNTER — Telehealth: Payer: Self-pay | Admitting: Obstetrics and Gynecology

## 2017-09-02 NOTE — Telephone Encounter (Signed)
Pt aware.

## 2017-09-02 NOTE — Telephone Encounter (Signed)
Pt is calling to schedule an appoinment due to swelling in face and sharp pain in ear. Patient was advise to follow up with OB provider from after hour nurse line. Please advise

## 2017-09-02 NOTE — Telephone Encounter (Signed)
Teresa Meza please advise. I originally told her to be seen at urgent care/PCP and she said the after hour triage nurse told her to follow up with us. Please call or advise

## 2017-09-02 NOTE — Telephone Encounter (Signed)
If she has pain in her ear, she needs to be seen at her PCP, because it is likely an ear infection. If she wants to see us following her appointment there for reassurance, that is fine, but they should see her for that problem. Thanks.

## 2017-09-08 ENCOUNTER — Other Ambulatory Visit: Payer: Medicaid Other

## 2017-09-09 ENCOUNTER — Other Ambulatory Visit: Payer: Self-pay | Admitting: Obstetrics and Gynecology

## 2017-09-09 DIAGNOSIS — R7309 Other abnormal glucose: Secondary | ICD-10-CM

## 2017-09-09 LAB — GLUCOSE TOLERANCE, 1 HOUR: GLUCOSE, 1HR PP: 183 mg/dL (ref 65–199)

## 2017-09-09 NOTE — Progress Notes (Signed)
Please call patient and let her know that she needs to schedule an appointment for a 3 hour glucose test because her 1 hour glucose test was positive. Thank you!

## 2017-09-10 ENCOUNTER — Other Ambulatory Visit: Payer: Self-pay

## 2017-09-10 ENCOUNTER — Emergency Department
Admission: EM | Admit: 2017-09-10 | Discharge: 2017-09-10 | Disposition: A | Discharge status: Home / Self Care | Payer: Medicaid Other | Attending: Student in an Organized Health Care Education/Training Program | Admitting: Student in an Organized Health Care Education/Training Program

## 2017-09-10 ENCOUNTER — Encounter: Payer: Self-pay | Admitting: Emergency Medicine

## 2017-09-10 DIAGNOSIS — O4690 Antepartum hemorrhage, unspecified, unspecified trimester: Secondary | ICD-10-CM

## 2017-09-10 DIAGNOSIS — O4692 Antepartum hemorrhage, unspecified, second trimester: Secondary | ICD-10-CM | POA: Insufficient documentation

## 2017-09-10 DIAGNOSIS — Z3A16 16 weeks gestation of pregnancy: Secondary | ICD-10-CM | POA: Insufficient documentation

## 2017-09-10 LAB — ABO/RH: ABO/RH(D): B POS

## 2017-09-10 LAB — URINALYSIS, COMPLETE (UACMP) WITH MICROSCOPIC
BILIRUBIN URINE: NEGATIVE
GLUCOSE, UA: NEGATIVE mg/dL
KETONES UR: 20 mg/dL — AB
Nitrite: NEGATIVE
PH: 6 (ref 5.0–8.0)
Protein, ur: NEGATIVE mg/dL
SPECIFIC GRAVITY, URINE: 1.011 (ref 1.005–1.030)

## 2017-09-10 MED ORDER — NITROFURANTOIN MONOHYD MACRO 100 MG PO CAPS: 100.0000 mg | ORAL_CAPSULE | Freq: Two times a day (BID) | ORAL | 0 refills | Status: AC

## 2017-09-10 MED ORDER — ACETAMINOPHEN 500 MG PO TABS: 1000.0000 mg | ORAL_TABLET | Freq: Once | ORAL | Status: DC

## 2017-09-10 NOTE — ED Provider Notes (Signed)
[facility] Emergency Department Provider Note    None    (approximate)  I have reviewed the triage vital signs and the nursing notes.   HISTORY  Chief Complaint Vaginal Discharge    HPI Teresa Meza is a 37 y.o. female  with some vaginal spotting and suprapubic discomfort roughly [redacted] weeks pregnant.  She is G4 P2.  No family history of bleeding disorders.  States the pain is mild.  Denies any intercourse or trauma.  No fevers.  Has had some nausea but no vomiting.  No diarrhea.  Has had prenatal evaluation and care.  Did have ultrasound in December that showed intrauterine pregnancy.  States that she is only noticing trace blood when she wipes this morning.   Past Medical History:  Diagnosis Date  . Ectopic fetus    Family History  Problem Relation Age of Onset  . Diabetes Mellitus II Mother    Past Surgical History:  Procedure Laterality Date  . ECTOPIC PREGNANCY SURGERY     Patient Active Problem List   Diagnosis Date Noted  . Elderly multigravida in first trimester 08/26/2017  . Supervision of high risk pregnancy, antepartum 08/26/2017  . Hx of gestational diabetes in prior pregnancy, currently pregnant 08/26/2017      Prior to Admission medications   Medication Sig Start Date End Date Taking? Authorizing Provider  Prenatal Vit-Fe Fumarate-FA (MULTIVITAMIN-PRENATAL) 27-0.8 MG TABS tablet Take 1 tablet by mouth daily at 12 noon.   Yes [provider]  furosemide (LASIX) 20 MG tablet Take 1 tablet (20 mg total) by mouth daily. Patient not taking: Reported on 09/10/2017 12/03/16 12/03/17  Joni Reining, PA-C  nitrofurantoin, macrocrystal-monohydrate, (MACROBID) 100 MG capsule Take 1 capsule (100 mg total) by mouth 2 (two) times daily for 5 days. 09/10/17 09/15/17  Willy Eddy, MD  predniSONE (DELTASONE) 50 MG tablet Take 1 tablet (50 mg total) by mouth daily. 2 tabs po daily x 4 days Patient not taking: Reported on 08/26/2017  01/29/16   Charlynne Pander, MD  traMADol (ULTRAM) 50 MG tablet Take 1 tablet (50 mg total) by mouth every 6 (six) hours as needed for moderate pain. Patient not taking: Reported on 08/26/2017 12/03/16   Joni Reining, PA-C    Allergies Sulfa antibiotics    Social History Social History   Tobacco Use  . Smoking status: Never Smoker  . Smokeless tobacco: Never Used  Substance Use Topics  . Alcohol use: No  . Drug use: No    Review of Systems Patient denies headaches, rhinorrhea, blurry vision, numbness, shortness of breath, chest pain, edema, cough, abdominal pain, nausea, vomiting, diarrhea, dysuria, fevers, rashes or hallucinations unless otherwise stated above in HPI. ____________________________________________   PHYSICAL EXAM:  VITAL SIGNS: Vitals:   09/10/17 0820  BP: 126/80  Pulse: (!) 112  Resp: 18  Temp: 99.5 F (37.5 C)  SpO2: 97%    Constitutional: Alert and oriented. Well appearing and in no acute distress. Eyes: Conjunctivae are normal.  Head: Atraumatic. Nose: No congestion/rhinnorhea. Mouth/Throat: Mucous membranes are moist.   Neck: No stridor. Painless ROM.  Cardiovascular: Normal rate, regular rhythm. Grossly normal heart sounds.  Good peripheral circulation. Respiratory: Normal respiratory effort.  No retractions. Lungs CTAB. Gastrointestinal: Soft and nontender. No distention. No abdominal bruits. No CVA tenderness. Genitourinary:  Musculoskeletal: No lower extremity tenderness nor edema.  No joint effusions. Neurologic:  Normal speech and language. No gross focal neurologic deficits are appreciated. No facial droop Skin:  Skin is warm, dry and intact. No rash noted. Psychiatric: Mood and affect are normal. Speech and behavior are normal.  ____________________________________________   LABS (all labs ordered are listed, but only abnormal results are displayed)  Results for orders placed or performed during the hospital encounter of  09/10/17 (from the past 24 hour(s))  ABO/Rh     Status: None   Collection Time: 09/10/17  9:08 AM  Result Value Ref Range   ABO/RH(D)      B POS Performed at [facility], [address] [place], [place] [address]   Urinalysis, Complete w Microscopic     Status: Abnormal   Collection Time: 09/10/17  9:08 AM  Result Value Ref Range   Color, Urine YELLOW (A) YELLOW   APPearance HAZY (A) CLEAR   Specific Gravity, Urine 1.011 1.005 - 1.030   pH 6.0 5.0 - 8.0   Glucose, UA NEGATIVE NEGATIVE mg/dL   Hgb urine dipstick MODERATE (A) NEGATIVE   Bilirubin Urine NEGATIVE NEGATIVE   Ketones, ur 20 (A) NEGATIVE mg/dL   Protein, ur NEGATIVE NEGATIVE mg/dL   Nitrite NEGATIVE NEGATIVE   Leukocytes, UA TRACE (A) NEGATIVE   RBC / HPF 0-5 0 - 5 RBC/hpf   WBC, UA 6-30 0 - 5 WBC/hpf   Bacteria, UA RARE (A) NONE SEEN   Squamous Epithelial / LPF 6-30 (A) NONE SEEN   Mucus PRESENT    ____________________________________________ ____________________________________________  RADIOLOGY  EMERGENCY DEPARTMENT [place] PREGNANCY "Study: Limited Ultrasound of the Pelvis for Pregnancy"  INDICATIONS:Pregnancy(required) and Vaginal bleeding Multiple views of the uterus and pelvic cavity were obtained in real-time with a multi-frequency probe.  APPROACH:Transabdominal  PERFORMED BY: Myself IMAGES ARCHIVED?: Yes LIMITATIONS: none PREGNANCY FREE FLUID: Present ADNEXAL FINDINGS: GESTATIONAL AGE, ESTIMATE:  FETAL HEART RATE: 150 INTERPRETATION: reassuring fetal movement and HR     ____________________________________________   PROCEDURES  Procedure(s) performed:  Procedures    Critical Care performed: no ____________________________________________   INITIAL IMPRESSION / ASSESSMENT AND PLAN / ED COURSE  Pertinent labs & imaging results that were available during my care of the patient were reviewed by me and considered in my medical decision making (see chart for details).  DDX:  ectopic, placental abruption, uterine rupture, subchorionic hematoma, threatened miscarriage  Teresa Meza is a 37 y.o. who presents to the ED with some vaginal spotting and suprapubic discomfort roughly [redacted] weeks pregnant.  She is G4 P2.  No family history of bleeding disorders.  States the pain is mild.  Outside ultrasound shows reassuring fetal heart rate and fetal movement.  Abdominal exam is soft and benign.  Does have trace bacteria on urine therefore will start on Macrobid.  At this point patient is stable and appropriate for follow-up as an outpatient.  Otherwise no acute distress.  Patient was able to tolerate PO and was able to ambulate with a steady gait.  Have discussed with the patient and available family all diagnostics and treatments performed thus far and all questions were answered to the best of my ability. The patient demonstrates understanding and agreement with plan.       ____________________________________________   FINAL CLINICAL IMPRESSION(S) / ED DIAGNOSES  Final diagnoses:  Vaginal bleeding in pregnancy      NEW MEDICATIONS STARTED DURING THIS VISIT:  New Prescriptions   NITROFURANTOIN, MACROCRYSTAL-MONOHYDRATE, (MACROBID) 100 MG CAPSULE    Take 1 capsule (100 mg total) by mouth 2 (two) times daily for 5 days.     Note:  This document  was prepared using Conservation officer, historic buildingsDragon voice recognition software and may include unintentional dictation errors.    Willy Eddyobinson, Vangie Henthorn, MD 09/10/17 1019

## 2017-09-10 NOTE — Discharge Instructions (Signed)

## 2017-09-10 NOTE — ED Triage Notes (Signed)
Started with brown spotting this morning.  No red blood per pt. [redacted] weeks pregnant.  Some lower abdominal cramping.  G4P2.

## 2017-09-22 ENCOUNTER — Other Ambulatory Visit: Payer: Medicaid Other

## 2017-09-23 ENCOUNTER — Encounter: Payer: Medicaid Other | Admitting: Obstetrics and Gynecology

## 2017-10-06 ENCOUNTER — Telehealth: Payer: Self-pay

## 2017-10-06 NOTE — Telephone Encounter (Signed)
Pt is having issues c phlegm and spitting.  [phone]  Pt has this with all her preg but this time it's worse like every 5 seconds.  Adv to try plain musinex.  [facility] pt no showed last appt.

## 2017-10-08 ENCOUNTER — Other Ambulatory Visit: Payer: Medicaid Other

## 2017-10-08 ENCOUNTER — Ambulatory Visit (INDEPENDENT_AMBULATORY_CARE_PROVIDER_SITE_OTHER): Payer: Medicaid Other | Admitting: Obstetrics and Gynecology

## 2017-10-08 ENCOUNTER — Encounter: Payer: Self-pay | Admitting: Obstetrics and Gynecology

## 2017-10-08 VITALS — BP 110/70 | Wt 218.0 lb

## 2017-10-08 DIAGNOSIS — Z6837 Body mass index (BMI) 37.0-37.9, adult: Secondary | ICD-10-CM | POA: Insufficient documentation

## 2017-10-08 DIAGNOSIS — O99210 Obesity complicating pregnancy, unspecified trimester: Secondary | ICD-10-CM | POA: Insufficient documentation

## 2017-10-08 DIAGNOSIS — O09521 Supervision of elderly multigravida, first trimester: Secondary | ICD-10-CM

## 2017-10-08 DIAGNOSIS — O24419 Gestational diabetes mellitus in pregnancy, unspecified control: Secondary | ICD-10-CM

## 2017-10-08 DIAGNOSIS — O09299 Supervision of pregnancy with other poor reproductive or obstetric history, unspecified trimester: Secondary | ICD-10-CM

## 2017-10-08 DIAGNOSIS — Z3A18 18 weeks gestation of pregnancy: Secondary | ICD-10-CM

## 2017-10-08 DIAGNOSIS — O0990 Supervision of high risk pregnancy, unspecified, unspecified trimester: Secondary | ICD-10-CM

## 2017-10-08 DIAGNOSIS — Z8632 Personal history of gestational diabetes: Secondary | ICD-10-CM

## 2017-10-08 HISTORY — DX: Obesity complicating pregnancy, unspecified trimester: O99.210

## 2017-10-08 NOTE — Progress Notes (Signed)
Teresa Meza: Pt c/o ear pain, lower back pain and cramping. Pt declines 3 hr GTT.      Routine Prenatal Care Visit  Subjective  Teresa Meza is a 37 y.o. [id number] at [redacted]w[redacted]d being seen today for ongoing prenatal care.  She is currently monitored for the following issues for this high-risk pregnancy and has Elderly multigravida in first trimester; Supervision of high risk pregnancy, antepartum; and Hx of gestational diabetes in prior pregnancy, currently pregnant on their problem list.  ----------------------------------------------------------------------------------- Patient reports no complaints.   Contractions: Not present. Vag. Bleeding: None.   . Denies leaking of fluid.  ----------------------------------------------------------------------------------- The following portions of the patient's history were reviewed and updated as appropriate: allergies, current medications, past family history, past medical history, past social history, past surgical history and problem list. Problem list updated.   Objective  Blood pressure 110/70, weight 218 lb (98.9 kg), last menstrual period 05/14/2017. Pregravid weight 210 lb (95.3 kg) Total Weight Gain 8 lb (3.629 kg) Body mass index is 37.42 kg/m. Urinalysis: Urine Protein: 1+ Urine Glucose: 1+  Fetal Status: Fetal Heart Rate (bpm): 150         General:  Alert, oriented and cooperative. Patient is in no acute distress.  Skin: Skin is warm and dry. No rash noted.   Cardiovascular: Normal heart rate noted  Respiratory: Normal respiratory effort, no problems with respiration noted  Abdomen: Soft, gravid, appropriate for gestational age. Pain/Pressure: Absent     Pelvic:  Cervical exam deferred        Extremities: Normal range of motion.     ental Status: Normal mood and affect. Normal behavior. Normal judgment and thought content.     Assessment   37 y.o. [id number] at [redacted]w[redacted]d by  03/07/2018, by Ultrasound presenting for routine prenatal  visit  Plan   pregnancy Problems (from 08/26/17 to present)    Problem Noted Resolved   Elderly multigravida in first trimester 08/26/2017 by Natale Milch, MD No   Overview Signed 08/26/2017 12:12 PM by Natale Milch, MD    Arly.Keller ] NIPT [ ]  NST/AFI weekly starting at 36 weeks      Supervision of high risk pregnancy, antepartum 08/26/2017 by Natale Milch, MD No   Overview Addendum 10/08/2017  9:22 AM by Natale Milch, MD      Clinic [facility] Prenatal Labs  Dating  6wk [place] Blood type: --/--/B POS  Genetic Screen  NIPS: Normal XY   Antibody:Negative (02/05 1516)  Anatomic [place]  Rubella: 4.25 (02/05 1516) Varicella: Immune  GTT Early: 1hr:189       28 wk:      RPR: Non Reactive (02/05 1516)   Rhogam  Not needed HBsAg: Negative (02/05 1516)   TDaP vaccine                       HIV: Non Reactive (02/05 1516)   Flu Shot   Declines                              GBS:   Contraception  Pap: NIL HPV negative 2019  CBB     CS/VBAC    Baby Food    Support Person             Hx of gestational diabetes in prior pregnancy, currently pregnant 08/26/2017 by Natale Milch, MD No   Overview Signed 08/26/2017 12:11 PM by Adelene Idler  R, MD    [ x] early 551GTT          Gestational age appropriate obstetric precautions including but not limited to vaginal bleeding, contractions, leaking of fluid and fetal movement were reviewed in detail with the patient.    Long discussion had about the likelihood that the patient has diabetes. Patient is insistent that she does not have diabetes. She feels that her 1hr GTT was elevated because she did not fast before the test after midnight and had gum before the 1hr GTT. I told the patient that despite these things it is never normal to have a blood glucose of 189 if she has proper functioning of her pancreas. I emphasized with the patient that she could be doing significant harm to the baby by having elevated blood glucose which  has been associated with spinal defects, heart defects, and large for gestational age infants. Patient was a gestational diabetic with her son. Patient was not a gestational diabetic with her second pregnancy(her daughter). I emphasized that regardless of her past that she may have become a diabetic outside of pregnancy at some point in the last 4 years. The patient was initially refusing a 3 hour glucose test. I suggested that the patient start keeping a log of her blood glucose levels so she could be started on medication if needed. The patient then decided that she would be willing to have the 3 hour glucose test. I arranged for this to be performed this Friday. If elevated patient will need a referral to [facility], maternal fetal medicine, and diabetic teaching.  Patient was willing to have hgba1c today to see what average glucose levels have been.  Anatomy US ordered for next visit.   Return in about 5 days (around 10/13/2017) for ROB and US, appt with Teresa Meza please.Adelene Idler.  Teresa Kreamer MD  [facility], [facility] 10/08/2017, 9:40 AM

## 2017-10-09 LAB — HEMOGLOBIN A1C
ESTIMATED AVERAGE GLUCOSE: 134 mg/dL
Hgb A1c MFr Bld: 6.3 % — ABNORMAL HIGH (ref 4.8–5.6)

## 2017-10-10 ENCOUNTER — Other Ambulatory Visit: Payer: Medicaid Other

## 2017-10-10 DIAGNOSIS — R7309 Other abnormal glucose: Secondary | ICD-10-CM

## 2017-10-11 LAB — GESTATIONAL GLUCOSE TOLERANCE
GLUCOSE 1 HOUR GTT: 183 mg/dL — AB (ref 65–179)
GLUCOSE FASTING: 107 mg/dL — AB (ref 65–94)
Glucose, GTT - 2 Hour: 197 mg/dL — ABNORMAL HIGH (ref 65–154)
Glucose, GTT - 3 Hour: 151 mg/dL — ABNORMAL HIGH (ref 65–139)

## 2017-10-13 ENCOUNTER — Other Ambulatory Visit: Payer: Medicaid Other

## 2017-10-13 ENCOUNTER — Other Ambulatory Visit: Payer: Self-pay | Admitting: Obstetrics and Gynecology

## 2017-10-13 ENCOUNTER — Encounter: Payer: Medicaid Other | Admitting: Obstetrics and Gynecology

## 2017-10-13 DIAGNOSIS — O24410 Gestational diabetes mellitus in pregnancy, diet controlled: Secondary | ICD-10-CM

## 2017-10-13 MED ORDER — ACCU-CHEK FASTCLIX LANCETS MISC
1.0000 [IU] | Freq: Four times a day (QID) | 12 refills | Status: DC
Start: 2017-10-13 — End: 2018-02-16

## 2017-10-13 MED ORDER — GLUCOSE BLOOD VI STRP: ORAL_STRIP | 12 refills | Status: DC

## 2017-10-13 MED ORDER — ACCU-CHEK AVIVA PLUS W/DEVICE KIT: 1.0000 | PACK | Freq: Once | 0 refills | Status: AC

## 2017-10-13 NOTE — Progress Notes (Signed)
Called and discussed with patient on the phone. Nutrition, MFM referrals made. Glucose meter and supplies sent to the pharmacy.  Patient has appointment on 10/14/17 will discuss glucose monitoring in detail at that time.

## 2017-10-14 ENCOUNTER — Ambulatory Visit (INDEPENDENT_AMBULATORY_CARE_PROVIDER_SITE_OTHER): Payer: Medicaid Other | Admitting: Advanced Practice Midwife

## 2017-10-14 ENCOUNTER — Encounter: Payer: Self-pay | Admitting: Advanced Practice Midwife

## 2017-10-14 ENCOUNTER — Ambulatory Visit (INDEPENDENT_AMBULATORY_CARE_PROVIDER_SITE_OTHER): Payer: Medicaid Other

## 2017-10-14 VITALS — BP 128/88 | Wt 219.0 lb

## 2017-10-14 DIAGNOSIS — O24419 Gestational diabetes mellitus in pregnancy, unspecified control: Secondary | ICD-10-CM

## 2017-10-14 DIAGNOSIS — O0990 Supervision of high risk pregnancy, unspecified, unspecified trimester: Secondary | ICD-10-CM | POA: Diagnosis not present

## 2017-10-14 DIAGNOSIS — Z3A19 19 weeks gestation of pregnancy: Secondary | ICD-10-CM

## 2017-10-14 HISTORY — DX: Gestational diabetes mellitus in pregnancy, unspecified control: O24.419

## 2017-10-14 NOTE — Progress Notes (Signed)
Routine Prenatal Care Visit  Subjective  Erma HeritageLatasha L Strickland is a 37 y.o. [id number] at 5638w3d being seen today for ongoing prenatal care.  She is currently monitored for the following issues for this high-risk pregnancy and has Elderly multigravida in first trimester; Supervision of high risk pregnancy, antepartum; Hx of gestational diabetes in prior pregnancy, currently pregnant; BMI 37.0-37.9, adult; and Obesity in pregnancy on their problem list.  ----------------------------------------------------------------------------------- Patient reports no complaints.  The patient is aware of her blood sugar values and is awaiting an appointment with [facility] Contractions: Not present. Vag. Bleeding: None.  Movement: Present. Denies leaking of fluid.  ----------------------------------------------------------------------------------- The following portions of the patient's history were reviewed and updated as appropriate: allergies, current medications, past family history, past medical history, past social history, past surgical history and problem list. Problem list updated.   Objective  Blood pressure 128/88, weight 219 lb (99.3 kg), last menstrual period 05/14/2017. Pregravid weight 210 lb (95.3 kg) Total Weight Gain 9 lb (4.082 kg) Urinalysis: Urine Protein: Negative Urine Glucose: Negative  Fetal Status: Fetal Heart Rate (bpm): 156   Movement: Present     Anatomy scan today is complete  General:  Alert, oriented and cooperative. Patient is in no acute distress.  Skin: Skin is warm and dry. No rash noted.   Cardiovascular: Normal heart rate noted  Respiratory: Normal respiratory effort, no problems with respiration noted  Abdomen: Soft, gravid, appropriate for gestational age. Pain/Pressure: Absent     Pelvic:  Cervical exam deferred        Extremities: Normal range of motion.  Edema: None  Mental Status: Normal mood and affect. Normal behavior. Normal judgment and thought content.    Assessment   37 y.o. [id number] at 4838w3d by  03/07/2018, by Ultrasound presenting for routine prenatal visit  Plan   pregnancy Problems (from 08/26/17 to present)    Problem Noted Resolved   Obesity in pregnancy 10/08/2017 by Natale MilchSchuman, Christanna R, MD No   Elderly multigravida in first trimester 08/26/2017 by Natale MilchSchuman, Christanna R, MD No   Overview Signed 08/26/2017 12:12 PM by Natale MilchSchuman, Christanna R, MD    [ ]  NIPT [ ]  NST/AFI weekly starting at 36 weeks      Supervision of high risk pregnancy, antepartum 08/26/2017 by Natale MilchSchuman, Christanna R, MD No   Overview Addendum 10/08/2017  9:22 AM by Natale MilchSchuman, Christanna R, MD      Clinic [facility] Prenatal Labs  Dating  6wk US Blood type: --/--/B POS  Genetic Screen  NIPS: Normal XY   Antibody:Negative (02/05 1516)  Anatomic US  Rubella: 4.25 (02/05 1516) Varicella: Immune  GTT Early: 1hr:189       28 wk:      RPR: Non Reactive (02/05 1516)   Rhogam  Not needed HBsAg: Negative (02/05 1516)   TDaP vaccine                       HIV: Non Reactive (02/05 1516)   Flu Shot   Declines                              GBS:   Contraception  Pap: NIL HPV negative 2019  CBB     CS/VBAC    Baby Food    Support Person             Hx of gestational diabetes in prior pregnancy, currently pregnant 08/26/2017 by Natale MilchSchuman, Christanna R, MD  No   Overview Signed 08/26/2017 12:11 PM by Natale Milch, MD    [x]  early 1GTT          Preterm labor symptoms and general obstetric precautions including but not limited to vaginal bleeding, contractions, leaking of fluid and fetal movement were reviewed in detail with the patient.   Return in about 1 month (around 11/11/2017) for rob.  Tresea Mall, CNM 10/14/2017 1:48 PM

## 2017-10-14 NOTE — Progress Notes (Signed)
ROB Anatomy scan today/ It is a Boy

## 2017-10-16 ENCOUNTER — Telehealth: Payer: Self-pay | Admitting: Obstetrics and Gynecology

## 2017-10-16 NOTE — Telephone Encounter (Signed)
Patient is needs to follow up in a week for ROB with Dr. Jerene PitchSchuman 10/22/17 or 10/24/17 to follow up on Diabetic treatment Per Dr. Jerene PitchSchuman. Patient also needs to contact [facility] [phone] per Dr. Jerene PitchSchuman

## 2017-10-16 NOTE — Telephone Encounter (Signed)
Left message for patient to schedule [facility] office appointment to learn how to check her blood glucose at home. Also asked patient to reschedule her next OB office visit for next week  So that we can monitor her diabetes and discuss management.

## 2017-10-16 NOTE — Telephone Encounter (Signed)
Patient is schedule 10/22/16 with Dr. Jerene PitchSchuman

## 2017-10-22 ENCOUNTER — Encounter: Payer: Medicaid Other | Admitting: Obstetrics and Gynecology

## 2017-10-23 ENCOUNTER — Ambulatory Visit
Admission: RE | Admit: 2017-10-23 | Discharge: 2017-10-23 | Disposition: A | Discharge status: Home / Self Care | Payer: Medicaid Other | Source: Ambulatory Visit | Attending: Family Medicine | Admitting: Family Medicine

## 2017-10-23 DIAGNOSIS — O09529 Supervision of elderly multigravida, unspecified trimester: Secondary | ICD-10-CM | POA: Insufficient documentation

## 2017-10-23 DIAGNOSIS — O0990 Supervision of high risk pregnancy, unspecified, unspecified trimester: Secondary | ICD-10-CM

## 2017-10-23 DIAGNOSIS — O24419 Gestational diabetes mellitus in pregnancy, unspecified control: Secondary | ICD-10-CM

## 2017-10-23 DIAGNOSIS — O99210 Obesity complicating pregnancy, unspecified trimester: Secondary | ICD-10-CM

## 2017-10-23 HISTORY — DX: Supervision of elderly multigravida, unspecified trimester: O09.529

## 2017-10-24 ENCOUNTER — Other Ambulatory Visit: Payer: Self-pay

## 2017-10-24 ENCOUNTER — Observation Stay
Admission: EM | Admit: 2017-10-24 | Discharge: 2017-10-24 | Disposition: A | Discharge status: Home / Self Care | Payer: Medicaid Other | Attending: Obstetrics and Gynecology | Admitting: Obstetrics and Gynecology

## 2017-10-24 DIAGNOSIS — Z882 Allergy status to sulfonamides status: Secondary | ICD-10-CM | POA: Diagnosis not present

## 2017-10-24 DIAGNOSIS — Z8632 Personal history of gestational diabetes: Secondary | ICD-10-CM

## 2017-10-24 DIAGNOSIS — O09299 Supervision of pregnancy with other poor reproductive or obstetric history, unspecified trimester: Secondary | ICD-10-CM

## 2017-10-24 DIAGNOSIS — O99210 Obesity complicating pregnancy, unspecified trimester: Secondary | ICD-10-CM

## 2017-10-24 DIAGNOSIS — O0990 Supervision of high risk pregnancy, unspecified, unspecified trimester: Secondary | ICD-10-CM

## 2017-10-24 DIAGNOSIS — O9989 Other specified diseases and conditions complicating pregnancy, childbirth and the puerperium: Principal | ICD-10-CM | POA: Insufficient documentation

## 2017-10-24 DIAGNOSIS — R1032 Left lower quadrant pain: Secondary | ICD-10-CM | POA: Diagnosis not present

## 2017-10-24 DIAGNOSIS — O09529 Supervision of elderly multigravida, unspecified trimester: Secondary | ICD-10-CM

## 2017-10-24 DIAGNOSIS — O0912 Supervision of pregnancy with history of ectopic or molar pregnancy, second trimester: Secondary | ICD-10-CM | POA: Insufficient documentation

## 2017-10-24 DIAGNOSIS — O09522 Supervision of elderly multigravida, second trimester: Secondary | ICD-10-CM | POA: Diagnosis not present

## 2017-10-24 DIAGNOSIS — Z3A20 20 weeks gestation of pregnancy: Secondary | ICD-10-CM | POA: Diagnosis not present

## 2017-10-24 DIAGNOSIS — R1031 Right lower quadrant pain: Secondary | ICD-10-CM | POA: Diagnosis not present

## 2017-10-24 LAB — CBC
HCT: 40 % (ref 35.0–47.0)
Hemoglobin: 13.4 g/dL (ref 12.0–16.0)
MCH: 28.5 pg (ref 26.0–34.0)
MCHC: 33.6 g/dL (ref 32.0–36.0)
MCV: 84.9 fL (ref 80.0–100.0)
Platelets: 256 10*3/uL (ref 150–440)
RBC: 4.71 MIL/uL (ref 3.80–5.20)
RDW: 13.6 % (ref 11.5–14.5)
WBC: 7.6 10*3/uL (ref 3.6–11.0)

## 2017-10-24 LAB — URINALYSIS, COMPLETE (UACMP) WITH MICROSCOPIC
Bilirubin Urine: NEGATIVE
GLUCOSE, UA: NEGATIVE mg/dL
KETONES UR: NEGATIVE mg/dL
LEUKOCYTES UA: NEGATIVE
NITRITE: NEGATIVE
PROTEIN: NEGATIVE mg/dL
Specific Gravity, Urine: 1.015 (ref 1.005–1.030)
pH: 5 (ref 5.0–8.0)

## 2017-10-24 MED ORDER — ACETAMINOPHEN 325 MG PO TABS
650.0000 mg | ORAL_TABLET | ORAL | Status: DC | PRN
  Administered 2017-10-24: 650 mg via ORAL
  Filled 2017-10-24: qty 2

## 2017-10-24 MED ORDER — ACETAMINOPHEN 325 MG PO TABS: 650.0000 mg | ORAL_TABLET | Freq: Four times a day (QID) | ORAL | Status: DC | PRN

## 2017-10-24 NOTE — Discharge Summary (Signed)
See Final Progress Note 

## 2017-10-24 NOTE — Final Progress Note (Signed)
Physician Final Progress Note  Patient ID: Teresa FeyLatasha L Meza MRN: [id number] DOB/AGE: 1980-11-14 37 y.o.  Admit date: 10/24/2017 Admitting provider: Conard NovakStephen D Michelena Culmer, MD Discharge date: 10/24/2017   Admission Diagnoses:  1) intrauterine pregnancy at 5963w6d  2) bilateral lower abdominal pain, pregnancy, second trimester  Discharge Diagnoses:  1) intrauterine pregnancy at 2163w6d  2) bilateral lower abdominal pain, pregnancy, second trimester  History of Present Illness: The patient is a 37 y.o. female [phone] at 663w6d who presents for worsening lower abdominal pain in her bilateral lower quadrants. The pain has been present for several days.  However, the pain is progressively getting worse.  She states the pain is in her groin area and up along her bilateral lower abdominal sides.  The pain is a pressure and cramp.  It otherwise does not radiate.  Heating pads make it a little better. Nothing makes it worse.  There are no associated symptoms. Specifically she denies fevers, chills, vomiting (she has her normal baseline nausea). She denies diarrhea. She continues to have constipation. She denies urinary symptoms, upper back and flank pain. She denies vaginal symptoms of itching, burning, and irritation. She has a history of ectopic pregnancy that was treated surgically.  She notes +FM, no LOF, no VB.  Hospital Course: the patient was admitted for observation. She had a normal fetal heart tone. She had no uterine activity.  Her vital signs were normal. Her UA was equivocal, but not strongly indicative of infection. She had no CVAT and no other focal findings. Bedside u/s showed single, living fetus with activity throughout the exam.  Placenta posterior.  Fluid subjectively normal.  Given all normal findings and no focal symptoms to pursue, she was discharged with precautions of fevers, chills, worsening symptoms, vaginal bleeding, decreased fetal movement, and focal symptoms.   Past Medical History:   Diagnosis Date  . Ectopic fetus   . Elderly multigravida 08/26/2017   [ ]  NIPT [ ]  NST/AFI weekly starting at 36 weeks    Past Surgical History:  Procedure Laterality Date  . ECTOPIC PREGNANCY SURGERY      No current facility-administered medications on file prior to encounter.    Current Outpatient Medications on File Prior to Encounter  Medication Sig Dispense Refill  . ACCU-CHEK FASTCLIX LANCETS MISC 1 Units by Percutaneous route 4 (four) times daily. 100 each 12  . glucose blood (ACCU-CHEK AVIVA) test strip Use as instructed 100 each 12  . Prenatal Vit-Fe Fumarate-FA (MULTIVITAMIN-PRENATAL) 27-0.8 MG TABS tablet Take 1 tablet by mouth daily at 12 noon.      Allergies  Allergen Reactions  . Sulfa Antibiotics Hives, Itching and Nausea Only    Social History   Socioeconomic History  . Marital status: Married    Spouse name: Not on file  . Number of children: Not on file  . Years of education: Not on file  . Highest education level: Not on file  Occupational History  . Not on file  Social Needs  . Financial resource strain: Not on file  . Food insecurity:    Worry: Not on file    Inability: Not on file  . Transportation needs:    Medical: Not on file    Non-medical: Not on file  Tobacco Use  . Smoking status: Never Smoker  . Smokeless tobacco: Never Used  Substance and Sexual Activity  . Alcohol use: No  . Drug use: No  . Sexual activity: Yes    Birth control/protection: None  Lifestyle  .  Physical activity:    Days per week: Not on file    Minutes per session: Not on file  . Stress: Not on file  Relationships  . Social connections:    Talks on phone: Not on file    Gets together: Not on file    Attends religious service: Not on file    Active member of club or organization: Not on file    Attends meetings of clubs or organizations: Not on file    Relationship status: Not on file  . Intimate partner violence:    Fear of current or ex partner: Not on  file    Emotionally abused: Not on file    Physically abused: Not on file    Forced sexual activity: Not on file  Other Topics Concern  . Not on file  Social History Narrative  . Not on file   Review of Systems  Constitutional: Negative.  Negative for chills, fever and weight loss.  HENT: Positive for congestion. Negative for ear discharge, ear pain, hearing loss, nosebleeds, sinus pain, sore throat and tinnitus.   Eyes: Negative.   Respiratory: Negative.  Negative for stridor.   Cardiovascular: Negative.   Gastrointestinal: Positive for abdominal pain, constipation, heartburn and nausea. Negative for blood in stool, diarrhea, melena and vomiting.  Genitourinary: Negative.   Musculoskeletal: Negative.   Skin: Negative.   Neurological: Negative.   Psychiatric/Behavioral: Negative.      Physical Exam: BP 123/84 (BP Location: Right Arm)   Pulse 100   Temp 98.8 F (37.1 C) (Oral)   Resp 19   LMP 05/14/2017   Physical Exam  Constitutional: She is oriented to person, place, and time and well-developed, well-nourished, and in no distress. No distress.  HENT:  Head: Normocephalic and atraumatic.  Eyes: Conjunctivae are normal. No scleral icterus.  Cardiovascular: Normal rate, regular rhythm and normal heart sounds.  Pulmonary/Chest: Effort normal and breath sounds normal. No respiratory distress. She has no wheezes. She has no rales.  Abdominal: Soft. She exhibits no distension. There is tenderness (mild RLQ and LLQ tenderness. ). There is no rebound and no guarding.  Musculoskeletal: Normal range of motion.  Neurological: She is alert and oriented to person, place, and time. No cranial nerve deficit.  Skin: Skin is warm and dry. No erythema.  Psychiatric: Mood, affect and judgment normal.   Consults: None  Significant Findings/ Diagnostic Studies:  Lab Results  Component Value Date   APPEARANCEUR HAZY (A) 10/24/2017   GLUCOSEU NEGATIVE 10/24/2017   BILIRUBINUR NEGATIVE  10/24/2017   KETONESUR NEGATIVE 10/24/2017   LABSPEC 1.015 10/24/2017   HGBUR MODERATE (A) 10/24/2017   PHURINE 5.0 10/24/2017   NITRITE NEGATIVE 10/24/2017   LEUKOCYTESUR NEGATIVE 10/24/2017   RBCU 0-5 10/24/2017   WBCU 0-5 10/24/2017   BACTERIA RARE (A) 10/24/2017   EPIU 6-30 (A) 10/24/2017   MUCOUSUACOMP PRESENT 09/04/2013    Procedures:  FHR: + and normal  Discharge Condition: stable  Disposition: Discharge disposition: 01-Home or Self Care      Diet: diabetic diet  Discharge Activity: Activity as tolerated   Allergies as of 10/24/2017      Reactions   Sulfa Antibiotics Hives, Itching, Nausea Only      Medication List    TAKE these medications   ACCU-CHEK FASTCLIX LANCETS Misc 1 Units by Percutaneous route 4 (four) times daily.   acetaminophen 325 MG tablet Commonly known as:  TYLENOL Take 2 tablets (650 mg total) by mouth every 6 (six) hours  as needed (for pain scale < 4  OR  temperature  >/=  100.5 F).   glucose blood test strip Commonly known as:  ACCU-CHEK AVIVA Use as instructed   multivitamin-prenatal 27-0.8 MG Tabs tablet Take 1 tablet by mouth daily at 12 noon.      Follow-up Information    Conard Novak, MD Follow up in 1 week(s).   Specialty:  Obstetrics and Gynecology Why:  routine prenatal and hospital follow up Contact information: [address] [place] [address] [phone]           Total time spent taking care of this patient: 30 minutes  Signed: Thomasene Mohair, MD  10/24/2017, 10:31 AM

## 2017-10-24 NOTE — Progress Notes (Signed)
Dr.S.Jackson at bedside discussing plan of care with patient, bedside Teresa Meza performed per patient request

## 2017-10-24 NOTE — OB Triage Note (Signed)
Patient presented to L&D with complaints of abdominal pain that started a few days ago but states it was worse this morning.  Denies vaginal, decreased fetal movement, or leaking of fluid.  Denies complications with pregnancy, denies history of preterm labor

## 2017-10-25 LAB — URINE CULTURE: Special Requests: NORMAL

## 2017-11-03 ENCOUNTER — Institutional Professional Consult (permissible substitution): Payer: Medicaid Other

## 2017-11-07 ENCOUNTER — Observation Stay
Admission: EM | Admit: 2017-11-07 | Discharge: 2017-11-07 | Disposition: A | Discharge status: Home / Self Care | Payer: Medicaid Other | Attending: Obstetrics & Gynecology | Admitting: Obstetrics & Gynecology

## 2017-11-07 ENCOUNTER — Other Ambulatory Visit: Payer: Self-pay

## 2017-11-07 DIAGNOSIS — Z3A22 22 weeks gestation of pregnancy: Secondary | ICD-10-CM | POA: Insufficient documentation

## 2017-11-07 DIAGNOSIS — O99212 Obesity complicating pregnancy, second trimester: Secondary | ICD-10-CM | POA: Insufficient documentation

## 2017-11-07 DIAGNOSIS — R05 Cough: Secondary | ICD-10-CM | POA: Diagnosis present

## 2017-11-07 DIAGNOSIS — R059 Cough, unspecified: Secondary | ICD-10-CM | POA: Diagnosis present

## 2017-11-07 DIAGNOSIS — O99210 Obesity complicating pregnancy, unspecified trimester: Secondary | ICD-10-CM | POA: Diagnosis present

## 2017-11-07 DIAGNOSIS — O26892 Other specified pregnancy related conditions, second trimester: Principal | ICD-10-CM | POA: Insufficient documentation

## 2017-11-07 DIAGNOSIS — O24419 Gestational diabetes mellitus in pregnancy, unspecified control: Secondary | ICD-10-CM | POA: Diagnosis present

## 2017-11-07 MED ORDER — ACETAMINOPHEN 325 MG PO TABS: 650.0000 mg | ORAL_TABLET | ORAL | Status: DC | PRN

## 2017-11-07 MED ORDER — ONDANSETRON HCL 4 MG/2ML IJ SOLN: 4.0000 mg | Freq: Four times a day (QID) | INTRAMUSCULAR | Status: DC | PRN

## 2017-11-07 NOTE — Final Progress Note (Signed)
Physician Final Progress Note  Patient ID: Teresa Meza MRN: [id number] DOB/AGE: December 22, 1980 37 y.o.  Admit date: 11/07/2017 Admitting provider: Nadara Mustard, MD Discharge date: 11/07/2017  Admission Diagnoses: Cough with leakage of fluid per vagina.  Pt is [redacted] weeks pregnant.  Discharge Diagnoses:  Active Problems:   Obesity in pregnancy   Gestational diabetes mellitus (GDM) affecting fourth pregnancy   Cough   Urinary leakage  Consults: None  Significant Findings/ Diagnostic Studies:   HPI:  37 y.o. [id number] @ [redacted]w[redacted]d (03/07/2018, by Ultrasound). Admitted on 11/07/2017:   Patient Active Problem List   Diagnosis Date Noted  . Cough 11/07/2017  . Labor and delivery indication for care or intervention 10/24/2017  . Advanced maternal age in multigravida, unspecified trimester 10/23/2017  . Gestational diabetes mellitus (GDM) affecting fourth pregnancy 10/14/2017  . BMI 37.0-37.9, adult 10/08/2017  . Obesity in pregnancy 10/08/2017  . Supervision of high risk pregnancy, antepartum 08/26/2017  . Hx of gestational diabetes in prior pregnancy, currently pregnant 08/26/2017   Presents for cough whic she has had for weeks but noticed leakage of clear fluid from vagina with coughing this am, also noted some blood tinged discharge afterwards; denies ctxs or pain..   P[facility]atal care at: at [facility]. Pregnancy complicated by gestational DM.  ROS: A review of systems was performed and negative, except as stated in the above HPI. PMHx:  Past Medical History:  Diagnosis Date  . Ectopic fetus   . Elderly multigravida 08/26/2017   [ ]  NIPT [ ]  NST/AFI weekly starting at 36 weeks   PSHx:  Past Surgical History:  Procedure Laterality Date  . ECTOPIC PREGNANCY SURGERY     Medications:  Medications Prior to Admission  Medication Sig Dispense Refill Last Dose  . ACCU-CHEK FASTCLIX LANCETS MISC 1 Units by Percutaneous route 4 (four) times daily. 100 each 12 11/06/2017  . acetaminophen  (TYLENOL) 325 MG tablet Take 2 tablets (650 mg total) by mouth every 6 (six) hours as needed (for pain scale < 4  OR  temperature  >/=  100.5 F).   11/06/2017  . glucose blood (ACCU-CHEK AVIVA) test strip Use as instructed 100 each 12 11/06/2017  . Prenatal Vit-Fe Fumarate-FA (MULTIVITAMIN-PRENATAL) 27-0.8 MG TABS tablet Take 1 tablet by mouth daily at 12 noon.   11/06/2017   Allergies: is allergic to sulfa antibiotics. OBHx:  OB History  Gravida Para Term Preterm AB Living  4 2 2   1 2   SAB TAB Ectopic Multiple Live Births      1   2    # Outcome Date GA Lbr Len/2nd Weight Sex Delivery Anes PTL Lv  4 Current           3 Term 10/06/13   6 lb 6 oz (2.892 kg) F Vag-Spont   LIV  2 Term 05/08/06   7 lb 6 oz (3.345 kg) M Vag-Spont   LIV     Complications: Gestational diabetes  1 Ectopic 2005           JYN:WGNFAOZH/YQMVHQIONGEX except as detailed in HPI.Marland Kitchen  No family history of birth defects. Soc Hx: Alcohol: none, Recreational drug use: none and Pregnancy welcomed  Objective:   Vitals:   11/07/17 1110  BP: 120/80  Pulse: (!) 105  Resp: 18  Temp: 98.9 F (37.2 C)   Constitutional: Well nourished, well developed female in no acute distress.  HEENT: normal Skin: Warm and dry.  Cardiovascular:Regular rate and rhythm.   Extremity: trace to 1+  bilateral pedal edema Respiratory: Clear to auscultation bilateral. Normal respiratory effort Abdomen: gravid NT Back: no CVAT Neuro: DTRs 2+, Cranial nerves grossly intact Psych: Alert and Oriented x3. No memory deficits. Normal mood and affect.  MS: normal gait, normal bilateral lower extremity ROM/strength/stability.  Pelvic exam: is not limited by body habitus EGBUS: within normal limits Vagina: within normal limits and with normal mucosa blood in the vault Cervix: closed, thick/high Uterus: No contractions observed for 30 minutes.  Adnexa: not evaluated SSE- NEG POOLING, NEG NITRAZINE, NEG FERN EFM:140 BPM Toco: None  Perinatal info:   Blood type: B positive Rubella- Immune Varicella -Immune TDaP tetanus status unknown to the patient RPR NR / HIV Neg/ HBsAg Neg   Assessment & Plan:   37 y.o. [id number] @ 4165w6d, Admitted on 11/07/2017:  Cough.  Leakage of fluid, likely urine leakage. Plan to send home as outpt and monitor for pain or continued leakage    Procedures: none  Discharge Condition: good  Disposition: Discharge disposition: 01-Home or Self Care  Diet: Regular diet  Discharge Activity: Activity as tolerated  Discharge Instructions    Call MD for:   Complete by:  As directed    Worsening contractions or pain; leakage of fluid; bleeding.   Diet general   Complete by:  As directed    Increase activity slowly   Complete by:  As directed      Allergies as of 11/07/2017      Reactions   Sulfa Antibiotics Hives, Itching, Nausea Only      Medication List    TAKE these medications   ACCU-CHEK FASTCLIX LANCETS Misc 1 Units by Percutaneous route 4 (four) times daily.   acetaminophen 325 MG tablet Commonly known as:  TYLENOL Take 2 tablets (650 mg total) by mouth every 6 (six) hours as needed (for pain scale < 4  OR  temperature  >/=  100.5 F).   glucose blood test strip Commonly known as:  ACCU-CHEK AVIVA Use as instructed   multivitamin-prenatal 27-0.8 MG Tabs tablet Take 1 tablet by mouth daily at 12 noon.      Follow-up Information    Nadara MustardHarris, Robert P, MD/ [facility] - - - KEEP APPOINTMENTS  Specialty:  Obstetrics and Gynecology Why:  Keep appt Contact information: [address] [place] [address] [phone]          Total time spent taking care of this patient: 20 minutes  Signed: Letitia Libraobert Paul Harris 11/07/2017, 11:55 AM

## 2017-11-07 NOTE — OB Triage Note (Signed)
Pt c/o lots of coughing and mucus production the past couple of days. This morning around 6 am she went to the bathroom to pee and started coughing and while coughing she felt two big gushes of fluid. She also noticed some pink tinge on her toilet paper when she wiped and felt some lower abdominal pressure. No pain just pressure, she still feels baby move around.

## 2017-11-07 NOTE — Discharge Summary (Signed)
  See FPN 

## 2017-11-11 ENCOUNTER — Encounter (INDEPENDENT_AMBULATORY_CARE_PROVIDER_SITE_OTHER): Payer: Self-pay

## 2017-11-11 ENCOUNTER — Encounter: Payer: Medicaid Other | Admitting: Obstetrics and Gynecology

## 2017-11-11 ENCOUNTER — Telehealth: Payer: Self-pay | Admitting: Obstetrics and Gynecology

## 2017-11-11 NOTE — Telephone Encounter (Signed)
I called and spoke with patient because she again did not come to her scheduled appointment. She told me she did not know her appointment was today. She has not been seen in the office for over a month. She know that she has gestational diabetes and that she needs to come to an office visit with a glucose log so that we can treat her diabetes. I told her that if her diabetes is not properly managed she can have pregnancy complications including fetal death. She told me "I do not receive that, that will not happen to me, in Jesus's name." I asked the patient to schedule a visit for this week so that we can address her gestational diabetes. I told her that if she continues to neglect her prenatal care we will no longer be able to care for her in pregnancy. The patient has also been non compliant with her lifestyle appointments.  I transferred her to the front desk and she reschedule for 11/19/17 with Dr. Jean RosenthalJackson.  I spoke with Molli KnockFelicia George our office manager about her and will send a message to Dorena DewMarilyn Steele about her.   Adelene Idlerhristanna Kashlynn Kundert MD [facility], [facility] 11/11/17 11:12 AM

## 2017-11-19 ENCOUNTER — Encounter: Payer: Medicaid Other | Admitting: Obstetrics and Gynecology

## 2017-11-25 ENCOUNTER — Encounter: Payer: Medicaid Other | Attending: Obstetrics and Gynecology | Admitting: Dietician

## 2017-11-25 ENCOUNTER — Encounter: Payer: Self-pay | Admitting: Dietician

## 2017-11-25 VITALS — BP 120/82 | Ht 63.5 in | Wt 220.5 lb

## 2017-11-25 DIAGNOSIS — Z713 Dietary counseling and surveillance: Secondary | ICD-10-CM | POA: Insufficient documentation

## 2017-11-25 DIAGNOSIS — O24410 Gestational diabetes mellitus in pregnancy, diet controlled: Secondary | ICD-10-CM | POA: Insufficient documentation

## 2017-12-02 ENCOUNTER — Ambulatory Visit: Payer: Medicaid Other | Admitting: Dietician

## 2017-12-10 ENCOUNTER — Ambulatory Visit: Payer: Medicaid Other | Admitting: Dietician

## 2017-12-17 ENCOUNTER — Encounter: Payer: Self-pay | Admitting: Dietician

## 2017-12-17 NOTE — Progress Notes (Signed)
Have not heard back from patient to reschedule her second missed appointment. Sent letter to referring provider. 

## 2017-12-25 ENCOUNTER — Telehealth: Payer: Self-pay | Admitting: Dietician

## 2017-12-25 NOTE — Telephone Encounter (Signed)
Called patient to reschedule her RD appointment, and rescheduled for 01/01/18 at 4pm. Patient states she is helping her mother in managing type 2 diabetes, so already knows about monitoring and meal planning; but does agree to return for one more visit. She is also coordinating transportation with husband and brother so scheduling is somewhat of a challenge.

## 2018-01-01 ENCOUNTER — Ambulatory Visit: Payer: Medicaid Other | Admitting: Dietician

## 2018-01-09 ENCOUNTER — Encounter: Payer: Self-pay | Admitting: Dietician

## 2018-01-09 NOTE — Progress Notes (Signed)
Patient did not keep rescheduled appointment on 01/01/18. This is the 3rd consecutive missed appointment. Discharge letter was sent to referring provider after 2nd missed appointment.

## 2018-01-24 ENCOUNTER — Observation Stay
Admission: EM | Admit: 2018-01-24 | Discharge: 2018-01-24 | Disposition: A | Discharge status: Home / Self Care | Payer: Medicaid Other | Attending: Obstetrics & Gynecology | Admitting: Obstetrics & Gynecology

## 2018-01-24 ENCOUNTER — Other Ambulatory Visit: Payer: Self-pay

## 2018-01-24 DIAGNOSIS — Z79899 Other long term (current) drug therapy: Secondary | ICD-10-CM | POA: Diagnosis not present

## 2018-01-24 DIAGNOSIS — O1203 Gestational edema, third trimester: Secondary | ICD-10-CM | POA: Diagnosis not present

## 2018-01-24 DIAGNOSIS — O09523 Supervision of elderly multigravida, third trimester: Secondary | ICD-10-CM | POA: Insufficient documentation

## 2018-01-24 DIAGNOSIS — O9989 Other specified diseases and conditions complicating pregnancy, childbirth and the puerperium: Secondary | ICD-10-CM | POA: Diagnosis present

## 2018-01-24 DIAGNOSIS — Z3A34 34 weeks gestation of pregnancy: Secondary | ICD-10-CM | POA: Insufficient documentation

## 2018-01-24 DIAGNOSIS — Z3490 Encounter for supervision of normal pregnancy, unspecified, unspecified trimester: Secondary | ICD-10-CM

## 2018-01-24 DIAGNOSIS — Z7982 Long term (current) use of aspirin: Secondary | ICD-10-CM | POA: Diagnosis not present

## 2018-01-24 DIAGNOSIS — R109 Unspecified abdominal pain: Secondary | ICD-10-CM | POA: Diagnosis not present

## 2018-01-24 LAB — PROTEIN / CREATININE RATIO, URINE
CREATININE, URINE: 62 mg/dL
PROTEIN CREATININE RATIO: 0.21 mg/mg{creat} — AB (ref 0.00–0.15)
TOTAL PROTEIN, URINE: 13 mg/dL

## 2018-01-24 LAB — CBC
HEMATOCRIT: 31.4 % — AB (ref 35.0–47.0)
HEMOGLOBIN: 10.6 g/dL — AB (ref 12.0–16.0)
MCH: 27 pg (ref 26.0–34.0)
MCHC: 33.9 g/dL (ref 32.0–36.0)
MCV: 79.8 fL — AB (ref 80.0–100.0)
Platelets: 284 10*3/uL (ref 150–440)
RBC: 3.93 MIL/uL (ref 3.80–5.20)
RDW: 14.3 % (ref 11.5–14.5)
WBC: 6.1 10*3/uL (ref 3.6–11.0)

## 2018-01-24 LAB — URINALYSIS, ROUTINE W REFLEX MICROSCOPIC
BILIRUBIN URINE: NEGATIVE
GLUCOSE, UA: NEGATIVE mg/dL
KETONES UR: NEGATIVE mg/dL
NITRITE: NEGATIVE
PH: 6 (ref 5.0–8.0)
Protein, ur: NEGATIVE mg/dL
Specific Gravity, Urine: 1.006 (ref 1.005–1.030)

## 2018-01-24 LAB — COMPREHENSIVE METABOLIC PANEL
ALT: 16 U/L (ref 0–44)
ANION GAP: 7 (ref 5–15)
AST: 24 U/L (ref 15–41)
Albumin: 2.7 g/dL — ABNORMAL LOW (ref 3.5–5.0)
Alkaline Phosphatase: 87 U/L (ref 38–126)
BUN: 7 mg/dL (ref 6–20)
CHLORIDE: 107 mmol/L (ref 98–111)
CO2: 22 mmol/L (ref 22–32)
Calcium: 9 mg/dL (ref 8.9–10.3)
Creatinine, Ser: 0.67 mg/dL (ref 0.44–1.00)
GFR calc Af Amer: >60 mL/min (ref >60)
GFR calc non Af Amer: >60 mL/min (ref >60)
Glucose, Bld: 94 mg/dL (ref 70–99)
POTASSIUM: 4 mmol/L (ref 3.5–5.1)
Sodium: 136 mmol/L (ref 135–145)
Total Bilirubin: 0.9 mg/dL (ref 0.3–1.2)
Total Protein: 6.3 g/dL — ABNORMAL LOW (ref 6.5–8.1)

## 2018-01-24 LAB — GLUCOSE, CAPILLARY: GLUCOSE-CAPILLARY: 99 mg/dL (ref 70–99)

## 2018-01-24 LAB — TSH: TSH: 1.877 u[IU]/mL (ref 0.350–4.500)

## 2018-01-24 MED ORDER — ACETAMINOPHEN 500 MG PO TABS
ORAL_TABLET | ORAL | Status: AC
  Filled 2018-01-24: qty 2

## 2018-01-24 MED ORDER — ACETAMINOPHEN 500 MG PO TABS
1000.0000 mg | ORAL_TABLET | Freq: Four times a day (QID) | ORAL | Status: DC | PRN
  Administered 2018-01-24: 1000 mg via ORAL

## 2018-01-24 NOTE — Progress Notes (Signed)
Spoke with Dr. Elesa MassedWard in department. MD reviewed lab results. Okay to discharge patient at this time. Pt to follow up on swelling with regular prenatal appointment.

## 2018-01-24 NOTE — Progress Notes (Addendum)
Fetal Monitors removed per MD.

## 2018-01-24 NOTE — Discharge Summary (Signed)
Teresa Meza is a 37 y.o. female. She is at 3141w0d gestation. Patient's last menstrual period was 05/14/2017. Estimated Date of Delivery: 03/07/18  [facility]   Chief complaint: body swelling, abdominal pain  Location: whole body -face, legs, abdomen Onset/timing: today Duration: all day Quality: swelling of skin and tender abdomen Severity: mild to moderate Aggravating or alleviating conditions: nothing has made better or worse Associated signs/symptoms: no CTX, no VB.no LOF,  Active fetal movement.  No excessive thirst, + frequent urination, no recent super salty food intake, no erratic sugar deviations, no new meds or food stuffs, no fever/chills. Denies: HA, visual changes, SOB, or RUQ/epigastric pain  Context: patient is high risk pregnancy with gestational vs pregestational diabetes, with adequate control.  She presents today with swelling of the face, extremeties, and abdominal tissue.  She has tenderness of the tissue, not of her uterus.  Nothing like this has happened before.  She is concerned!  S: Resting comfortably.  Maternal Medical History:   Past Medical History:  Diagnosis Date  . Ectopic fetus   . Elderly multigravida 08/26/2017   [ ]  NIPT [ ]  NST/AFI weekly starting at 36 weeks    Past Surgical History:  Procedure Laterality Date  . ECTOPIC PREGNANCY SURGERY      Allergies  Allergen Reactions  . Sulfa Antibiotics Hives, Itching and Nausea Only    Prior to Admission medications   Medication Sig Start Date End Date Taking? Authorizing Provider  ACCU-CHEK FASTCLIX LANCETS MISC 1 Units by Percutaneous route 4 (four) times daily. 10/13/17  Yes Schuman, Jaquelyn Bitterhristanna R, MD  acetaminophen (TYLENOL) 325 MG tablet Take 2 tablets (650 mg total) by mouth every 6 (six) hours as needed (for pain scale < 4  OR  temperature  >/=  100.5 F). 10/24/17  Yes Conard NovakJackson, Stephen D, MD  aspirin EC 81 MG tablet Take 1 tablet by mouth daily. 11/20/17  11/20/18 Yes [provider]  glucose blood (ACCU-CHEK AVIVA) test strip Use as instructed 10/13/17  Yes Schuman, Christanna R, MD  Prenatal Vit-Fe Fumarate-FA (MULTIVITAMIN-PRENATAL) 27-0.8 MG TABS tablet Take 1 tablet by mouth daily at 12 noon.   Yes [provider]     Social History: She  reports that she has never smoked. She has never used smokeless tobacco. She reports that she does not drink alcohol or use drugs.  Family History: family history includes Diabetes Mellitus II in her mother.   Review of Systems: A full review of systems was performed and negative except as noted in the HPI.     O:  BP 122/81 (BP Location: Right Arm)   Pulse 100   Temp 98.7 F (37.1 C) (Oral)   Resp 20   Wt 104.1 kg (229 lb 6.4 oz)   LMP 05/14/2017   BMI 40.00 kg/m  Results for orders placed or performed during the hospital encounter of 01/24/18 (from the past 48 hour(s))  Glucose, capillary   Collection Time: 01/24/18  6:38 PM  Result Value Ref Range   Glucose-Capillary 99 70 - 99 mg/dL  Protein / creatinine ratio, urine   Collection Time: 01/24/18  7:15 PM  Result Value Ref Range   Creatinine, Urine 62 mg/dL   Total Protein, Urine 13 mg/dL   Protein Creatinine Ratio 0.21 (H) 0.00 - 0.15 mg/mg[Cre]  Urinalysis, Routine w reflex microscopic   Collection Time: 01/24/18  7:15 PM  Result Value Ref Range   Color, Urine YELLOW (A) YELLOW  APPearance HAZY (A) CLEAR   Specific Gravity, Urine 1.006 1.005 - 1.030   pH 6.0 5.0 - 8.0   Glucose, UA NEGATIVE NEGATIVE mg/dL   Hgb urine dipstick SMALL (A) NEGATIVE   Bilirubin Urine NEGATIVE NEGATIVE   Ketones, ur NEGATIVE NEGATIVE mg/dL   Protein, ur NEGATIVE NEGATIVE mg/dL   Nitrite NEGATIVE NEGATIVE   Leukocytes, UA MODERATE (A) NEGATIVE   RBC / HPF 0-5 0 - 5 RBC/hpf   WBC, UA 6-10 0 - 5 WBC/hpf   Bacteria, UA FEW (A) NONE SEEN   Squamous Epithelial / LPF 6-10 0 - 5  Comprehensive metabolic panel   Collection Time: 01/24/18   7:36 PM  Result Value Ref Range   Sodium 136 135 - 145 mmol/L   Potassium 4.0 3.5 - 5.1 mmol/L   Chloride 107 98 - 111 mmol/L   CO2 22 22 - 32 mmol/L   Glucose, Bld 94 70 - 99 mg/dL   BUN 7 6 - 20 mg/dL   Creatinine, Ser 1.61 0.44 - 1.00 mg/dL   Calcium 9.0 8.9 - 09.6 mg/dL   Total Protein 6.3 (L) 6.5 - 8.1 g/dL   Albumin 2.7 (L) 3.5 - 5.0 g/dL   AST 24 15 - 41 U/L   ALT 16 0 - 44 U/L   Alkaline Phosphatase 87 38 - 126 U/L   Total Bilirubin 0.9 0.3 - 1.2 mg/dL   GFR calc non Af Amer >60 >60 mL/min   GFR calc Af Amer >60 >60 mL/min   Anion gap 7 5 - 15  CBC   Collection Time: 01/24/18  7:36 PM  Result Value Ref Range   WBC 6.1 3.6 - 11.0 K/uL   RBC 3.93 3.80 - 5.20 MIL/uL   Hemoglobin 10.6 (L) 12.0 - 16.0 g/dL   HCT 04.5 (L) 40.9 - 81.1 %   MCV 79.8 (L) 80.0 - 100.0 fL   MCH 27.0 26.0 - 34.0 pg   MCHC 33.9 32.0 - 36.0 g/dL   RDW 91.4 78.2 - 95.6 %   Platelets 284 150 - 440 K/uL  TSH   Collection Time: 01/24/18  7:36 PM  Result Value Ref Range   TSH 1.877 0.350 - 4.500 uIU/mL     Constitutional: NAD, AAOx3  HE/ENT: extraocular movements grossly intact, moist mucous membranes CV: RRR PULM: nl respiratory effort, CTABL     Abd: gravid, non-tender, non-distended, soft      Ext: Non-tender, Nonedmeatous   Psych: mood appropriate, speech normal Pelvic: 2/thick 0 station, anterior.  Cervix is just under pubic bone.  NST:  Baseline: 140 Variability: moderate Accelerations present x >2 Decelerations absent Time    A/P: 37 y.o. [redacted]w[redacted]d here for swelling  Labor: not present.   Fetal Wellbeing: Reassuring Cat 1 tracing.  Reactive NST   Normotensive, and negative proteinuria on UA.    TSH normal  No obvious source/cause of swelling, likely transient and secondary to end stages of pregnancy.  If worsens or if develops HA or change in vision, SOB or other side effect she will let me know.  D/c home stable, precautions reviewed, follow-up as scheduled.    ----- Ranae Plumber, MD Attending Obstetrician and Gynecologist [facility], Department of OB/GYN [facility]

## 2018-01-24 NOTE — OB Triage Note (Signed)
Pt here with compliants of facial swelling, abdominal and vaginal pain. - pt. Decided to come to hospital to be evaluated.  Denies sudden gush of fluid,  Or vaginal bleeding but has had a small amount of mucous discharge. Positive for fetal movement. No sexual activity in last 24 hours.  Pt connected to external fetal monitor - FHR ; toco applied, abd. Soft. Clean catch urine collected for additional test if needed.

## 2018-02-10 ENCOUNTER — Other Ambulatory Visit: Payer: Self-pay

## 2018-02-10 ENCOUNTER — Observation Stay
Admission: EM | Admit: 2018-02-10 | Discharge: 2018-02-10 | Disposition: A | Discharge status: Home / Self Care | Payer: Medicaid Other | Attending: Certified Nurse Midwife | Admitting: Certified Nurse Midwife

## 2018-02-10 DIAGNOSIS — R109 Unspecified abdominal pain: Secondary | ICD-10-CM | POA: Insufficient documentation

## 2018-02-10 DIAGNOSIS — O1493 Unspecified pre-eclampsia, third trimester: Secondary | ICD-10-CM

## 2018-02-10 DIAGNOSIS — D649 Anemia, unspecified: Secondary | ICD-10-CM

## 2018-02-10 DIAGNOSIS — G8929 Other chronic pain: Secondary | ICD-10-CM | POA: Diagnosis present

## 2018-02-10 DIAGNOSIS — Z3A36 36 weeks gestation of pregnancy: Secondary | ICD-10-CM | POA: Insufficient documentation

## 2018-02-10 DIAGNOSIS — R030 Elevated blood-pressure reading, without diagnosis of hypertension: Secondary | ICD-10-CM

## 2018-02-10 DIAGNOSIS — O26893 Other specified pregnancy related conditions, third trimester: Secondary | ICD-10-CM | POA: Diagnosis present

## 2018-02-10 DIAGNOSIS — Z7982 Long term (current) use of aspirin: Secondary | ICD-10-CM | POA: Diagnosis not present

## 2018-02-10 DIAGNOSIS — Z882 Allergy status to sulfonamides status: Secondary | ICD-10-CM | POA: Diagnosis not present

## 2018-02-10 DIAGNOSIS — K6289 Other specified diseases of anus and rectum: Secondary | ICD-10-CM | POA: Diagnosis present

## 2018-02-10 HISTORY — DX: Unspecified pre-eclampsia, third trimester: O14.93

## 2018-02-10 HISTORY — DX: Elevated blood-pressure reading, without diagnosis of hypertension: R03.0

## 2018-02-10 LAB — COMPREHENSIVE METABOLIC PANEL
ALT: 17 U/L (ref 0–44)
AST: 25 U/L (ref 15–41)
Albumin: 2.7 g/dL — ABNORMAL LOW (ref 3.5–5.0)
Alkaline Phosphatase: 105 U/L (ref 38–126)
Anion gap: 7 (ref 5–15)
BUN: 5 mg/dL — ABNORMAL LOW (ref 6–20)
CHLORIDE: 109 mmol/L (ref 98–111)
CO2: 19 mmol/L — AB (ref 22–32)
CREATININE: 0.71 mg/dL (ref 0.44–1.00)
Calcium: 8.7 mg/dL — ABNORMAL LOW (ref 8.9–10.3)
Glucose, Bld: 111 mg/dL — ABNORMAL HIGH (ref 70–99)
Potassium: 4 mmol/L (ref 3.5–5.1)
Sodium: 135 mmol/L (ref 135–145)
Total Bilirubin: 0.8 mg/dL (ref 0.3–1.2)
Total Protein: 6.1 g/dL — ABNORMAL LOW (ref 6.5–8.1)

## 2018-02-10 LAB — CBC
HCT: 30 % — ABNORMAL LOW (ref 35.0–47.0)
Hemoglobin: 10.1 g/dL — ABNORMAL LOW (ref 12.0–16.0)
MCH: 26.4 pg (ref 26.0–34.0)
MCHC: 33.6 g/dL (ref 32.0–36.0)
MCV: 78.6 fL — AB (ref 80.0–100.0)
PLATELETS: 259 10*3/uL (ref 150–440)
RBC: 3.81 MIL/uL (ref 3.80–5.20)
RDW: 14.4 % (ref 11.5–14.5)
WBC: 6.3 10*3/uL (ref 3.6–11.0)

## 2018-02-10 LAB — PROTEIN / CREATININE RATIO, URINE
CREATININE, URINE: 41 mg/dL
Protein Creatinine Ratio: 0.78 mg/mg{Cre} — ABNORMAL HIGH (ref 0.00–0.15)
TOTAL PROTEIN, URINE: 32 mg/dL

## 2018-02-10 MED ORDER — ACETAMINOPHEN 325 MG PO TABS: 650.0000 mg | ORAL_TABLET | ORAL | Status: DC | PRN

## 2018-02-10 MED ORDER — CEPHALEXIN 500 MG PO CAPS
500.0000 mg | ORAL_CAPSULE | Freq: Four times a day (QID) | ORAL | Status: DC
  Administered 2018-02-10: 500 mg via ORAL
  Filled 2018-02-10: qty 1

## 2018-02-10 MED ORDER — CEPHALEXIN 500 MG PO CAPS: 500.0000 mg | ORAL_CAPSULE | Freq: Four times a day (QID) | ORAL | 0 refills | Status: DC

## 2018-02-10 NOTE — Discharge Instructions (Signed)
Please call [phone] [clinic M-F 8:30a-4:30p] or [phone] [L&D] if you have any of the following symptoms: -Swelling of the face or hands -Headache that will not go away -Blurry vision, white or black spots in your vision, bright lights/stars in your vision -Pain on the right, upper side of your abdomen that aches and is not from the baby's feet kicking you -Pain in the upper, middle of your abdomen, directly under your ribcage -Nausea or vomiting/unable to keep food or liquids down -Difficulty breathing  

## 2018-02-10 NOTE — OB Triage Note (Signed)
Pt c/o abdominal pain that started a couple weeks ago. Her abdomen is red and inflamed. Very tinder to the touch, and warm to touch.

## 2018-02-10 NOTE — OB Triage Note (Signed)
Discharge instructions provided and reviewed.  Follow up care and pain management discussed. Pt verbalized understanding.   

## 2018-02-10 NOTE — Discharge Summary (Signed)
Teresa Meza is a 37 y.o. female. She is at [redacted]w[redacted]d gestation. Patient's last menstrual period was 05/14/2017. Estimated Date of Delivery: 03/07/18  Prenatal care site: [facility]   Chief complaint: Abdominal pain Location: lower abdomen Onset/timing: started a few weeks ago and has gotten worse, increased skin tenderness started a few days ago Duration: constant Quality: sharp, stabbing Severity: moderate to severe Aggravating or alleviating conditions: certain positions make it worse, nothing seems to make it better Associated signs/symptoms: feverish and took Tylenol about 2 hours ago, nausea Context: Teresa Meza reports that she has been having abdominal pain for a few weeks. A few days ago, it started getting worse. The pain is there al of the time, but is sometimes great in intensity. She does not recall any bug bites or other recent skin irritants or injuries. She reports that she has not been to the beach, the woods, or hiking.   S: Resting in bed, husband at the bedside.   She reports:  -active fetal movement -no leakage of fluid -no vaginal bleeding -no contractions -no headache or vision changes, no vomiting, no chest pain or shortness of breath  Maternal Medical History:   Past Medical History:  Diagnosis Date  . Ectopic fetus   . Elderly multigravida 08/26/2017   [ ]  NIPT [ ]  NST/AFI weekly starting at 36 weeks    Past Surgical History:  Procedure Laterality Date  . ECTOPIC PREGNANCY SURGERY      Allergies  Allergen Reactions  . Sulfa Antibiotics Hives, Itching and Nausea Only    Prior to Admission medications   Medication Sig Start Date End Date Taking? Authorizing Provider  ACCU-CHEK FASTCLIX LANCETS MISC 1 Units by Percutaneous route 4 (four) times daily. 10/13/17   Schuman, Jaquelyn Bitter, MD  acetaminophen (TYLENOL) 325 MG tablet Take 2 tablets (650 mg total) by mouth every 6 (six) hours as needed (for pain scale < 4  OR  temperature  >/=  100.5  F). 10/24/17   Conard Novak, MD  aspirin EC 81 MG tablet Take 1 tablet by mouth daily. 11/20/17 11/20/18  [provider]  glucose blood (ACCU-CHEK AVIVA) test strip Use as instructed 10/13/17   Schuman, Jaquelyn Bitter, MD  Prenatal Vit-Fe Fumarate-FA (MULTIVITAMIN-PRENATAL) 27-0.8 MG TABS tablet Take 1 tablet by mouth daily at 12 noon.    [provider]     Social History: She  reports that she has never smoked. She has never used smokeless tobacco. She reports that she does not drink alcohol or use drugs.  Family History: family history includes Diabetes Mellitus II in her mother.   Review of Systems: A full review of systems was performed and negative except as noted in the HPI.    O:  Temp 97.6 F (36.4 C) (Oral)   LMP 05/14/2017  Results for orders placed or performed during the hospital encounter of 02/10/18 (from the past 48 hour(s))  Protein / creatinine ratio, urine   Collection Time: 02/10/18  7:19 PM  Result Value Ref Range   Creatinine, Urine 41 mg/dL   Total Protein, Urine 32 mg/dL   Protein Creatinine Ratio 0.78 (H) 0.00 - 0.15 mg/mg[Cre]  CBC   Collection Time: 02/10/18  7:29 PM  Result Value Ref Range   WBC 6.3 3.6 - 11.0 K/uL   RBC 3.81 3.80 - 5.20 MIL/uL   Hemoglobin 10.1 (L) 12.0 - 16.0 g/dL   HCT 16.1 (L) 09.6 - 04.5 %   MCV 78.6 (L) 80.0 -  100.0 fL   MCH 26.4 26.0 - 34.0 pg   MCHC 33.6 32.0 - 36.0 g/dL   RDW 16.114.4 09.611.5 - 04.514.5 %   Platelets 259 150 - 440 K/uL  Comprehensive metabolic panel   Collection Time: 02/10/18  7:29 PM  Result Value Ref Range   Sodium 135 135 - 145 mmol/L   Potassium 4.0 3.5 - 5.1 mmol/L   Chloride 109 98 - 111 mmol/L   CO2 19 (L) 22 - 32 mmol/L   Glucose, Bld 111 (H) 70 - 99 mg/dL   BUN 5 (L) 6 - 20 mg/dL   Creatinine, Ser 4.090.71 0.44 - 1.00 mg/dL   Calcium 8.7 (L) 8.9 - 10.3 mg/dL   Total Protein 6.1 (L) 6.5 - 8.1 g/dL   Albumin 2.7 (L) 3.5 - 5.0 g/dL   AST 25 15 - 41 U/L   ALT 17 0 - 44 U/L   Alkaline  Phosphatase 105 38 - 126 U/L   Total Bilirubin 0.8 0.3 - 1.2 mg/dL   GFR calc non Af Amer >60 >60 mL/min   GFR calc Af Amer >60 >60 mL/min   Anion gap 7 5 - 15     Constitutional: NAD, AAOx3  HE/ENT: extraocular movements grossly intact, moist mucous membranes CV: RRR PULM: nl respiratory effort, CTABL     Abd: gravid, non-distended, soft, b/l LQ erythematous and swollen, tender to palpation   Ext: Non-tender, Nonedmeatous   Psych: mood appropriate, speech normal Pelvic deferred  NST:  Baseline: 135bpm  Variability: moderate Accelerations: 15x15s present x >2 Decelerations: absent Toco: quiet Time: 45mins  A/P: 37 y.o. 8136w3d here for antenatal surveillance for abdominal pain  Labor: not present.   Fetal Wellbeing: Reactive NST and reassuring for GA  Cellulitis:  Plan to treat with cephalexin 500mg  QID x 10 days, dose given here and prescription sent to preferred pharmacy  Preeclampsia without severe features:  Mild range BPs with some normotensive BPs over 4 hour period (120s-140s/80s-90s)  No signs or symptoms of severe features of preeclampsia  CBC and CMP with no evidence of severe features of preeclampsia  P/C ratio elevated at 0.780  Discussed that with preeclampsia without severe features, we typically induce labor at 37 weeks. Reviewed severe signs and symptoms.   Anemia  Hemoglobin 10.1, advised daily iron supplementation with vitamin C rich foods, as well as iron-rich foods  Muscular pain  Advised hydration, Tylenol PRN, and pregnancy support band as cellulitis improves  D/c home stable, precautions reviewed, follow-up in the office tomorrow as scheduled.   ----- Genia DelMargaret Brylan Seubert, CNM Certified Nurse Midwife [facility], Department of OB/GYN [facility]

## 2018-02-11 ENCOUNTER — Other Ambulatory Visit: Payer: Self-pay | Admitting: Obstetrics and Gynecology

## 2018-02-11 ENCOUNTER — Other Ambulatory Visit: Payer: Self-pay

## 2018-02-11 ENCOUNTER — Inpatient Hospital Stay
Admission: AD | Admit: 2018-02-11 | Discharge: 2018-02-16 | DRG: 806 | Disposition: A | Discharge status: Home / Self Care | Payer: Medicaid Other | Attending: Obstetrics & Gynecology | Admitting: Obstetrics & Gynecology

## 2018-02-11 DIAGNOSIS — O24425 Gestational diabetes mellitus in childbirth, controlled by oral hypoglycemic drugs: Secondary | ICD-10-CM | POA: Diagnosis present

## 2018-02-11 DIAGNOSIS — Z3A36 36 weeks gestation of pregnancy: Secondary | ICD-10-CM

## 2018-02-11 DIAGNOSIS — O43123 Velamentous insertion of umbilical cord, third trimester: Secondary | ICD-10-CM | POA: Diagnosis present

## 2018-02-11 DIAGNOSIS — O9081 Anemia of the puerperium: Secondary | ICD-10-CM | POA: Diagnosis not present

## 2018-02-11 DIAGNOSIS — O99214 Obesity complicating childbirth: Secondary | ICD-10-CM | POA: Diagnosis present

## 2018-02-11 DIAGNOSIS — E669 Obesity, unspecified: Secondary | ICD-10-CM | POA: Diagnosis present

## 2018-02-11 DIAGNOSIS — O09529 Supervision of elderly multigravida, unspecified trimester: Secondary | ICD-10-CM

## 2018-02-11 DIAGNOSIS — O9972 Diseases of the skin and subcutaneous tissue complicating childbirth: Secondary | ICD-10-CM | POA: Diagnosis present

## 2018-02-11 DIAGNOSIS — O0990 Supervision of high risk pregnancy, unspecified, unspecified trimester: Secondary | ICD-10-CM

## 2018-02-11 DIAGNOSIS — D62 Acute posthemorrhagic anemia: Secondary | ICD-10-CM | POA: Diagnosis not present

## 2018-02-11 DIAGNOSIS — L03311 Cellulitis of abdominal wall: Secondary | ICD-10-CM | POA: Diagnosis present

## 2018-02-11 DIAGNOSIS — O09299 Supervision of pregnancy with other poor reproductive or obstetric history, unspecified trimester: Secondary | ICD-10-CM

## 2018-02-11 DIAGNOSIS — Z3490 Encounter for supervision of normal pregnancy, unspecified, unspecified trimester: Secondary | ICD-10-CM | POA: Diagnosis not present

## 2018-02-11 DIAGNOSIS — O1404 Mild to moderate pre-eclampsia, complicating childbirth: Secondary | ICD-10-CM | POA: Diagnosis present

## 2018-02-11 DIAGNOSIS — Z8632 Personal history of gestational diabetes: Secondary | ICD-10-CM

## 2018-02-11 DIAGNOSIS — O99210 Obesity complicating pregnancy, unspecified trimester: Secondary | ICD-10-CM

## 2018-02-11 LAB — CBC
HCT: 31.3 % — ABNORMAL LOW (ref 35.0–47.0)
Hemoglobin: 10.4 g/dL — ABNORMAL LOW (ref 12.0–16.0)
MCH: 26.2 pg (ref 26.0–34.0)
MCHC: 33.3 g/dL (ref 32.0–36.0)
MCV: 78.7 fL — ABNORMAL LOW (ref 80.0–100.0)
PLATELETS: 281 10*3/uL (ref 150–440)
RBC: 3.98 MIL/uL (ref 3.80–5.20)
RDW: 14.7 % — AB (ref 11.5–14.5)
WBC: 6.6 10*3/uL (ref 3.6–11.0)

## 2018-02-11 LAB — COMPREHENSIVE METABOLIC PANEL
ALT: 18 U/L (ref 0–44)
AST: 29 U/L (ref 15–41)
Albumin: 2.9 g/dL — ABNORMAL LOW (ref 3.5–5.0)
Alkaline Phosphatase: 116 U/L (ref 38–126)
Anion gap: 7 (ref 5–15)
BUN: <5 mg/dL — ABNORMAL LOW (ref 6–20)
CO2: 21 mmol/L — AB (ref 22–32)
Calcium: 9.1 mg/dL (ref 8.9–10.3)
Chloride: 108 mmol/L (ref 98–111)
Creatinine, Ser: 0.73 mg/dL (ref 0.44–1.00)
GFR calc Af Amer: >60 mL/min (ref >60)
GFR calc non Af Amer: >60 mL/min (ref >60)
Glucose, Bld: 111 mg/dL — ABNORMAL HIGH (ref 70–99)
POTASSIUM: 3.9 mmol/L (ref 3.5–5.1)
Sodium: 136 mmol/L (ref 135–145)
Total Bilirubin: 0.9 mg/dL (ref 0.3–1.2)
Total Protein: 6.7 g/dL (ref 6.5–8.1)

## 2018-02-11 LAB — GLUCOSE, CAPILLARY
Glucose-Capillary: 107 mg/dL — ABNORMAL HIGH (ref 70–99)
Glucose-Capillary: 109 mg/dL — ABNORMAL HIGH (ref 70–99)
Glucose-Capillary: 120 mg/dL — ABNORMAL HIGH (ref 70–99)

## 2018-02-11 MED ORDER — METFORMIN HCL 500 MG PO TABS
500.0000 mg | ORAL_TABLET | Freq: Every day | ORAL | Status: DC
  Administered 2018-02-12 – 2018-02-15 (×3): 500 mg via ORAL
  Filled 2018-02-11 (×5): qty 1

## 2018-02-11 MED ORDER — CEFAZOLIN SODIUM-DEXTROSE 1-4 GM/50ML-% IV SOLN
1.0000 g | Freq: Three times a day (TID) | INTRAVENOUS | Status: DC
  Administered 2018-02-11 – 2018-02-16 (×15): 1 g via INTRAVENOUS
  Filled 2018-02-11 (×21): qty 50

## 2018-02-11 MED ORDER — FERROUS FUMARATE 324 (106 FE) MG PO TABS
1.0000 | ORAL_TABLET | Freq: Two times a day (BID) | ORAL | Status: DC
  Administered 2018-02-11 – 2018-02-16 (×8): 106 mg via ORAL
  Filled 2018-02-11 (×10): qty 1

## 2018-02-11 MED ORDER — OXYCODONE HCL 5 MG PO TABS
5.0000 mg | ORAL_TABLET | ORAL | Status: DC | PRN
  Administered 2018-02-11 – 2018-02-15 (×15): 5 mg via ORAL
  Filled 2018-02-11 (×17): qty 1

## 2018-02-11 MED ORDER — ONDANSETRON HCL 4 MG/2ML IJ SOLN: 4.0000 mg | Freq: Four times a day (QID) | INTRAMUSCULAR | Status: DC | PRN

## 2018-02-11 MED ORDER — ONDANSETRON HCL 4 MG PO TABS: 4.0000 mg | ORAL_TABLET | Freq: Four times a day (QID) | ORAL | Status: DC | PRN

## 2018-02-11 MED ORDER — ASPIRIN EC 81 MG PO TBEC
81.0000 mg | DELAYED_RELEASE_TABLET | Freq: Every day | ORAL | Status: DC
  Administered 2018-02-11 – 2018-02-13 (×3): 81 mg via ORAL
  Filled 2018-02-11 (×4): qty 1

## 2018-02-11 NOTE — Progress Notes (Unsigned)
Orders placed for inpatient admission. Sharee Pimplearon W. Jones, RN, MSN, CNM, FNP

## 2018-02-11 NOTE — H&P (Signed)
Teresa Meza is a 37 y.o. female G4P2012 presenting for abdominal cellulitis approx 30 cms sized, 36 4/[redacted] weeks pregnant, A2GDM on Metformin, AMA, Anemia and Pre-ecclampsia without severe features. Pt was seen in Triage last pm by M.Haviland and given a Cephalexin 500 mg po x 1 and then pt was to pick up Rx today and start on the 500 mg po BID regimen. Pt was in to much pain to pick up and came to the office today. She rates her pain a 10-10 at times. She feels poorly and cannot take the pain. Has not started the po meds. OB History    Gravida  4   Para  2   Term  2   Preterm      AB  1   Living  2     SAB      TAB      Ectopic  1   Multiple      Live Births  2          Past Medical History:  Diagnosis Date  . Ectopic fetus   . Elderly multigravida 08/26/2017   [ ] NIPT [ ] NST/AFI weekly starting at 36 weeks  . Elevated blood pressure reading without diagnosis of hypertension 02/10/2018   Past Surgical History:  Procedure Laterality Date  . ECTOPIC PREGNANCY SURGERY     Family History: family history includes Diabetes Mellitus II in her mother. Social History:  reports that she has never smoked. She has never used smokeless tobacco. She reports that she does not drink alcohol or use drugs.     Maternal Diabetes: A2GDM on metformin 500 mg daily  Genetic Screening: To late Maternal Ultrasounds/Referrals: see above US report Fetal Ultrasounds or other Referrals:  N/A Maternal Substance Abuse:  Neg Significant Maternal Medications:  Metformin 500 mg po qam, Fe daily,  Significant Maternal Lab Results:  P:C ratio 780 Other Comments:  Pain score is 10 out of 10 at intervals  Review of Systems  Constitutional: Negative for malaise/fatigue.  HENT: Negative.   Eyes: Negative.   Respiratory: Negative.   Cardiovascular: Negative.   Gastrointestinal: Negative.   Genitourinary: Negative.   Musculoskeletal: Negative.   Skin: Negative for rash.  Neurological:  Negative.   Endo/Heme/Allergies: Negative.   Psychiatric/Behavioral: Negative.    History   Blood pressure 133/90, pulse 98, temperature 98.3 F (36.8 C), temperature source Oral, resp. rate 18, height 5' 3.5" (1.613 m), weight 103.9 kg (229 lb), last menstrual period 05/14/2017, SpO2 100 %. Exam Physical Exam  .HEENT Gen:A,A&Ox3 HEENT: Normocephalic, Eyes non-icteric. HEART:S1S2, RRR, No M/R/G LUNGS:CTA bilat, no W/R/R ABD: 30 cm annular sized edematous and erythemic area which is indurated, no abcess formation. Gravid. Area circled with surgical pen so that the cellulitis can be followed.  Extrems:warm, dry, NT, Neg Homan's  Prenatal labs: ABO, Rh: --/--/B POS Performed at [facility], [address] [place], [place] [address]  (02/20 0908) Antibody: Negative (02/05 1516) Rubella: 4.25 (02/05 1516) RPR: Non Reactive (02/05 1516)  HBsAg: Negative (02/05 1516)  HIV: Non Reactive (02/05 1516)  GBS:   tested on 02/11/18 at office  Assessment/Plan: A:1. AMA 2. A2GDM on Metformin daily 3. Obesity 4. Abd Cellulitis 5. Pre-ecclampsia without severe features:P:C ratio780 6. Anemia-on Fe  P:1. Admit for antibiotics and IOL scheduled for 02/14/18 due to pre-ecclampsia, DM:goals are not at 100% being met. 2. Ancef 1 gm IV q 8 hours 3. GBS unknown but, culture sent on 02/11/18 4.   Anemia:Continue Fe. 5. Tx pain with hydrocodone if needed. ________________  W. ,RN, MSN, CNM, FNP Certified Nurse Midwife [facility]  W  02/11/2018, 7:22 PM    

## 2018-02-12 LAB — COMPREHENSIVE METABOLIC PANEL
ALBUMIN: 2.7 g/dL — AB (ref 3.5–5.0)
ALT: 18 U/L (ref 0–44)
ANION GAP: 9 (ref 5–15)
AST: 32 U/L (ref 15–41)
Alkaline Phosphatase: 109 U/L (ref 38–126)
BUN: 6 mg/dL (ref 6–20)
CHLORIDE: 106 mmol/L (ref 98–111)
CO2: 19 mmol/L — ABNORMAL LOW (ref 22–32)
Calcium: 9.1 mg/dL (ref 8.9–10.3)
Creatinine, Ser: 0.82 mg/dL (ref 0.44–1.00)
GFR calc Af Amer: >60 mL/min (ref >60)
GFR calc non Af Amer: >60 mL/min (ref >60)
GLUCOSE: 97 mg/dL (ref 70–99)
POTASSIUM: 3.7 mmol/L (ref 3.5–5.1)
SODIUM: 134 mmol/L — AB (ref 135–145)
Total Bilirubin: 1.1 mg/dL (ref 0.3–1.2)
Total Protein: 6.5 g/dL (ref 6.5–8.1)

## 2018-02-12 LAB — CBC
HEMATOCRIT: 30.8 % — AB (ref 35.0–47.0)
HEMOGLOBIN: 10.3 g/dL — AB (ref 12.0–16.0)
MCH: 26.1 pg (ref 26.0–34.0)
MCHC: 33.5 g/dL (ref 32.0–36.0)
MCV: 78 fL — ABNORMAL LOW (ref 80.0–100.0)
Platelets: 279 10*3/uL (ref 150–440)
RBC: 3.95 MIL/uL (ref 3.80–5.20)
RDW: 14.6 % — AB (ref 11.5–14.5)
WBC: 6.9 10*3/uL (ref 3.6–11.0)

## 2018-02-12 LAB — GLUCOSE, CAPILLARY
GLUCOSE-CAPILLARY: 109 mg/dL — AB (ref 70–99)
GLUCOSE-CAPILLARY: 98 mg/dL (ref 70–99)
Glucose-Capillary: 102 mg/dL — ABNORMAL HIGH (ref 70–99)
Glucose-Capillary: 117 mg/dL — ABNORMAL HIGH (ref 70–99)

## 2018-02-12 LAB — HEMOGLOBIN A1C
Hgb A1c MFr Bld: 7 % — ABNORMAL HIGH (ref 4.8–5.6)
Mean Plasma Glucose: 154.2 mg/dL

## 2018-02-12 MED ORDER — DOCUSATE SODIUM 100 MG PO CAPS
200.0000 mg | ORAL_CAPSULE | Freq: Once | ORAL | Status: AC
  Administered 2018-02-12: 200 mg via ORAL
  Filled 2018-02-12: qty 2

## 2018-02-12 NOTE — Progress Notes (Signed)
Patient transferred to Obs2 from 343 for NST. Pt. Called out complaining of pain stating she feels "constipated". Uncertain whether she is having contractions. Reports good fetal movement. Elevated BP noted on M/B prior to transfer. Repeat BP obtained. EFM applied and assessing.

## 2018-02-12 NOTE — Progress Notes (Signed)
NT reports patient respiration rate of 24BPM and, patient blood pressure 150/100  to RN upon taking it a 2nd time. Pt was just sitting up in the chair watching TV. Pt reports pain level 8/10 and requests medication and a shower. Pain medication given per RN, and patient's IV taped up so she could take a shower. RN & NT also noticed that the patient's breathing sounded abnormal. Lungs auscultated clear bilaterally. Pt doesn't report any difficulty breathing, but states "my breathing has sounded like that since this pregnancy". RN to notify provider and will continue to monitor.

## 2018-02-12 NOTE — Progress Notes (Signed)
Outpatient AUB:    Subjective:    Patient ID: Teresa Meza [id number] G4P2012 at 36+5dweeks in the antepartum unit for abdominal cellulitis, on iv ancef q8hrs. She is HD#1 today on 02/11/2018  - gDMA2 on metformin, sugars 2hrs postprandial are in normal range, though A1C is 7.0 - PreE without severe features, asx. BP elevated to mild range and p:c ratio 0.78  HPI: Pain 6-8/10, still present, worse with standing. Centered in edematous pannus, smaller erythema than yesterday. Tolerating po. Showered today. IV still in place, left hand. +spontaneously voiding  Review of Systems  No SOB, chest pain. Good fetal movement. NST reactive today. Peeing less frequently than yesterday. No headache    Objective:    BP (!) 150/100 (BP Location: Right Arm) Comment: nurse B. Gean QuintNielsen notified  Pulse (!) 111 Comment: nurse B. Nielsen notified  Temp 97.8 F (36.6 C) (Oral)   Resp (!) 24 Comment: nurse B. Gean QuintNielsen notified  Ht 5' 3.5" (1.613 m)   Wt 103.9 kg (229 lb)   LMP 05/14/2017   SpO2 100%   BMI 39.93 kg/m  Physical Exam Skin TTP over cellulitis area. No fundal or abdominal tenderness No vaginal discharge. No mons tenderness. CV: RRR Pulm: no tachypnea currently, lungs are clear     Assessment & Plan:   1. Continue IV abx for now. Her WBC is normal and she is afebrile. I don't know where the cellulitis came from; there are no scratchs or bug bites, it is not PUPPs or similar and is currently asx. The area it covers is much smaller than the marker line from admission.  2. PreE- plan for induction at 37+0wks 3. Continue metformin and diabetic diet. Her sugars on this dose are normal 4. Diabetic diet, ambulation prn 5. NSTs daily  Christeen DouglasBethany Sherrell Meza 02/12/2018 12:16 PM

## 2018-02-13 LAB — GLUCOSE, CAPILLARY
GLUCOSE-CAPILLARY: 112 mg/dL — AB (ref 70–99)
GLUCOSE-CAPILLARY: 112 mg/dL — AB (ref 70–99)
Glucose-Capillary: 99 mg/dL (ref 70–99)
Glucose-Capillary: 120 mg/dL — ABNORMAL HIGH (ref 70–99)

## 2018-02-13 NOTE — Plan of Care (Signed)
Vs stable; up ad lib; see note regarding pain; has oxycodone for pain control; checking pt glucose fasting and 2 hours post prandial; pt has gestational diabetes and pregnancy induced hypertension

## 2018-02-13 NOTE — Progress Notes (Signed)
Antepartum Progress Note   Subjective:    Patient ID: Teresa Meza [id number] G4P2012 at 466w6d in the antepartum unit for abdominal cellulitis, on IV Ancef q8hrs. She is HD#2 today on 02/13/2018  - gDMA2 on Metformin, 2h PP blood glucose WNL, A1C is 7.0 - PreE without severe features, asymptomatic BP elevated to mild range and P/C ratio 0.78  Pain improved, but still rated 5-8/10. Centered in edematous pannus, erythema improved from 3 days ago.  Review of Systems  No SOB, chest pain. Good fetal movement. NST reactive yesterday with normal FHTs today by doppler per RN Gregary CromerHannah Cahoon. No headache or vision changes, no nausea or vomiting, no chest pain or shortness of breath, or RUQ/epigastric pain.     Objective:    BP (!) 142/87   Pulse (!) 110   Temp 98.9 F (37.2 C) (Oral)   Resp 18   Ht 5' 3.5" (1.613 m)   Wt 103.9 kg (229 lb)   LMP 05/14/2017   SpO2 99%   BMI 39.93 kg/m    Physical Exam Skin TTP over cellulitis area. No fundal or abdominal tenderness No vaginal discharge. No mons tenderness. Pulm: respiratory effort normal     Assessment & Plan:   Cellulitis: -erythema and edema improving -continue IV antibiotics  Preeclampsia without severe features: -induction scheduled overnight -mild range BPs   Diabetes: -continue Metformin and diabetic diet  Genia DelMargaret Kei Langhorst, CNM 02/13/2018 9:26 PM

## 2018-02-13 NOTE — Progress Notes (Signed)
OB History & Physical   History of Present Illness:  Chief Complaint:   HPI:  REGENIA ERCK is a 37 y.o. [id number] female at [redacted]w[redacted]d dated by [redacted]w[redacted]d ultrasound. She presents to L&D for scheduled induction of labor for preeclampsia without severe features.   She reports:  -active fetal movement -no leakage of fluid  -no vaginal bleeding -uncomfortable contractions since misoprostol administration  Pregnancy Issues: 1. Prepregnancy BMI 37 2. AMA 3. Transfer of care from [facility] at 29w 4. GDM vs pre-existing undiagnosed DM, on Metformin  Maternal Medical History:   Past Medical History:  Diagnosis Date  . Ectopic fetus   . Elderly multigravida 08/26/2017   [ ]  NIPT [ ]  NST/AFI weekly starting at 36 weeks  . Elevated blood pressure reading without diagnosis of hypertension 02/10/2018    Past Surgical History:  Procedure Laterality Date  . ECTOPIC PREGNANCY SURGERY      Allergies  Allergen Reactions  . Sulfa Antibiotics Hives, Itching and Nausea Only    Prior to Admission medications   Medication Sig Start Date End Date Taking? Authorizing Provider  ACCU-CHEK FASTCLIX LANCETS MISC 1 Units by Percutaneous route 4 (four) times daily. 10/13/17  Yes Schuman, Jaquelyn Bitter, MD  acetaminophen (TYLENOL) 325 MG tablet Take 2 tablets (650 mg total) by mouth every 6 (six) hours as needed (for pain scale < 4  OR  temperature  >/=  100.5 F). 10/24/17  Yes Conard Novak, MD  aspirin EC 81 MG tablet Take 1 tablet by mouth daily. 11/20/17 11/20/18 Yes [provider]  cephALEXin (KEFLEX) 500 MG capsule Take 1 capsule (500 mg total) by mouth 4 (four) times daily for 10 days. 02/10/18 02/20/18 Yes Genia Del, CNM  glucose blood (ACCU-CHEK AVIVA) test strip Use as instructed 10/13/17  Yes Schuman, Christanna R, MD  Prenatal Vit-Fe Fumarate-FA (MULTIVITAMIN-PRENATAL) 27-0.8 MG TABS tablet Take 1 tablet by mouth daily at 12 noon.   Yes [provider]     Prenatal care  site: [facility]  Social History: She  reports that she has never smoked. She has never used smokeless tobacco. She reports that she does not drink alcohol or use drugs.  Family History: family history includes Diabetes Mellitus II in her mother.   Review of Systems: A full review of systems was performed and negative except as noted in the HPI.    Physical Exam:  Vital Signs: BP (!) 135/91 (BP Location: Left Arm)   Pulse 99   Temp 98 F (36.7 C) (Oral)   Resp 18   Ht 5' 3.5" (1.613 m)   Wt 103.9 kg (229 lb)   LMP 05/14/2017   SpO2 98%   BMI 39.93 kg/m   General:   alert, cooperative, appears stated age and mild distress  Skin:  normal and no rash or abnormalities  Neurologic:    Alert & oriented x 3  Lungs:   clear to auscultation bilaterally  Heart:   regular rate and rhythm, S1, S2 normal, no murmur, click, rub or gallop  Abdomen:  soft, bowel sounds normal, no masses, no organomegaly, continued edematous area distal to umbilicus with some erythema  Pelvis:  Exam deferred.  FHT:  135 BPM  Presentations: cephalic  Cervix:  Per RN Floy Sabina at [phone]   Dilation: 1cm   Effacement: 50%   Station:  -3   Consistency: medium   Position: middle  Extremities: : non-tender, symmetric, trace edema bilaterally.     EFW: 7lb15oz  3609g 87% per growth scan on 02/11/2018  Results for orders placed or performed during the hospital encounter of 02/11/18 (from the past 24 hour(s))  Glucose, capillary     Status: Abnormal   Collection Time: 02/13/18 12:11 PM  Result Value Ref Range   Glucose-Capillary 120 (H) 70 - 99 mg/dL  Glucose, capillary     Status: Abnormal   Collection Time: 02/13/18  3:37 PM  Result Value Ref Range   Glucose-Capillary 112 (H) 70 - 99 mg/dL  Glucose, capillary     Status: Abnormal   Collection Time: 02/13/18  8:52 PM  Result Value Ref Range   Glucose-Capillary 112 (H) 70 - 99 mg/dL  CBC     Status: Abnormal   Collection Time: 02/14/18  6:06 AM   Result Value Ref Range   WBC 7.2 3.6 - 11.0 K/uL   RBC 3.97 3.80 - 5.20 MIL/uL   Hemoglobin 10.4 (L) 12.0 - 16.0 g/dL   HCT 86.5 (L) 78.4 - 69.6 %   MCV 78.3 (L) 80.0 - 100.0 fL   MCH 26.3 26.0 - 34.0 pg   MCHC 33.7 32.0 - 36.0 g/dL   RDW 29.5 (H) 28.4 - 13.2 %   Platelets 281 150 - 440 K/uL  Type and screen [facility]     Status: None   Collection Time: 02/14/18  6:06 AM  Result Value Ref Range   ABO/RH(D) B POS    Antibody Screen NEG    Sample Expiration      02/17/2018 Performed at [facility], [address] [place], [place] [address]   Comprehensive metabolic panel     Status: Abnormal   Collection Time: 02/14/18  6:06 AM  Result Value Ref Range   Sodium 136 135 - 145 mmol/L   Potassium 3.8 3.5 - 5.1 mmol/L   Chloride 108 98 - 111 mmol/L   CO2 21 (L) 22 - 32 mmol/L   Glucose, Bld 97 70 - 99 mg/dL   BUN 8 6 - 20 mg/dL   Creatinine, Ser 2.72 0.44 - 1.00 mg/dL   Calcium 9.0 8.9 - 53.6 mg/dL   Total Protein 6.3 (L) 6.5 - 8.1 g/dL   Albumin 2.7 (L) 3.5 - 5.0 g/dL   AST 30 15 - 41 U/L   ALT 18 0 - 44 U/L   Alkaline Phosphatase 108 38 - 126 U/L   Total Bilirubin 1.0 0.3 - 1.2 mg/dL   GFR calc non Af Amer >60 >60 mL/min   GFR calc Af Amer >60 >60 mL/min   Anion gap 7 5 - 15  Rapid HIV screen (HIV 1/2 Ab+Ag) ([facility] Only)     Status: None   Collection Time: 02/14/18  6:06 AM  Result Value Ref Range   HIV-1 P24 Antigen - HIV24 NON REACTIVE NON REACTIVE   HIV 1/2 Antibodies NON REACTIVE NON REACTIVE   Interpretation (HIV Ag Ab)      A non reactive test result means that HIV 1 or HIV 2 antibodies and HIV 1 p24 antigen were not detected in the specimen.  Glucose, capillary     Status: None   Collection Time: 02/14/18  6:10 AM  Result Value Ref Range   Glucose-Capillary 90 70 - 99 mg/dL    Pertinent Results:  Prenatal Labs: Blood type/Rh B+  Antibody screen neg  Rubella Immune  Varicella Immune  RPR NR  HBsAg Neg  HIV NR  GC neg   Chlamydia neg  Genetic screening  negative  Early 1 hour GTT 183  3 hour GTT 107, 183, 197, 151  GBS negative   FHT: FHR: 130 bpm, variability: moderate,  accelerations:  Present,  decelerations:  Absent Category/reactivity:  Category I TOCO: regular, every 2-4 minutes   Assessment:  Gertie FeyLatasha L Cappello is a 37 y.o. 504P2012 female at 6452w0d with induction for preeclampsia without severe features.   Plan:  1. Admit to Labor & Delivery; consents reviewed and obtained  2. Fetal Well being  - Fetal Tracing: category I - GBS negative - Presentation: cephalic confirmed by Leopold's   3. Routine OB: - Prenatal labs reviewed, as above - Rh positive - CBC & T&S on admit - Clear fluids, IVF  4. Induction of Labor -  Contractions to be monitored with external toco in place -  Pelvis proven  -  Plan for induction with misoprostol -  Plan for continuous fetal monitoring  -  Maternal pain control as desired: IVPM, nitrous, regional anesthesia -  Anticipate vaginal delivery   Genia DelMargaret Baylyn Sickles, CNM 02/14/2018 8:04 AM ----- Genia DelMargaret Tifani Dack Certified Nurse Midwife [facility], Department of OB/GYN [facility]

## 2018-02-14 ENCOUNTER — Inpatient Hospital Stay: Payer: Medicaid Other | Admitting: Anesthesiology

## 2018-02-14 ENCOUNTER — Encounter: Payer: Self-pay | Admitting: Anesthesiology

## 2018-02-14 DIAGNOSIS — Z3490 Encounter for supervision of normal pregnancy, unspecified, unspecified trimester: Secondary | ICD-10-CM | POA: Diagnosis not present

## 2018-02-14 LAB — CBC
HCT: 31 % — ABNORMAL LOW (ref 35.0–47.0)
Hemoglobin: 10.4 g/dL — ABNORMAL LOW (ref 12.0–16.0)
MCH: 26.3 pg (ref 26.0–34.0)
MCHC: 33.7 g/dL (ref 32.0–36.0)
MCV: 78.3 fL — AB (ref 80.0–100.0)
Platelets: 281 10*3/uL (ref 150–440)
RBC: 3.97 MIL/uL (ref 3.80–5.20)
RDW: 14.8 % — ABNORMAL HIGH (ref 11.5–14.5)
WBC: 7.2 10*3/uL (ref 3.6–11.0)

## 2018-02-14 LAB — RAPID HIV SCREEN (HIV 1/2 AB+AG)
HIV 1/2 Antibodies: NONREACTIVE
HIV-1 P24 ANTIGEN - HIV24: NONREACTIVE

## 2018-02-14 LAB — GLUCOSE, CAPILLARY
GLUCOSE-CAPILLARY: 90 mg/dL (ref 70–99)
Glucose-Capillary: 127 mg/dL — ABNORMAL HIGH (ref 70–99)
Glucose-Capillary: 79 mg/dL (ref 70–99)
Glucose-Capillary: 88 mg/dL (ref 70–99)

## 2018-02-14 LAB — COMPREHENSIVE METABOLIC PANEL
ALT: 18 U/L (ref 0–44)
AST: 30 U/L (ref 15–41)
Albumin: 2.7 g/dL — ABNORMAL LOW (ref 3.5–5.0)
Alkaline Phosphatase: 108 U/L (ref 38–126)
Anion gap: 7 (ref 5–15)
BUN: 8 mg/dL (ref 6–20)
CALCIUM: 9 mg/dL (ref 8.9–10.3)
CHLORIDE: 108 mmol/L (ref 98–111)
CO2: 21 mmol/L — ABNORMAL LOW (ref 22–32)
CREATININE: 0.84 mg/dL (ref 0.44–1.00)
GFR calc Af Amer: >60 mL/min (ref >60)
GFR calc non Af Amer: >60 mL/min (ref >60)
Glucose, Bld: 97 mg/dL (ref 70–99)
Potassium: 3.8 mmol/L (ref 3.5–5.1)
Sodium: 136 mmol/L (ref 135–145)
Total Bilirubin: 1 mg/dL (ref 0.3–1.2)
Total Protein: 6.3 g/dL — ABNORMAL LOW (ref 6.5–8.1)

## 2018-02-14 LAB — TYPE AND SCREEN
ABO/RH(D): B POS
Antibody Screen: NEGATIVE

## 2018-02-14 MED ORDER — DIPHENHYDRAMINE HCL 50 MG/ML IJ SOLN: 12.5000 mg | INTRAMUSCULAR | Status: DC | PRN

## 2018-02-14 MED ORDER — LIDOCAINE HCL (PF) 1 % IJ SOLN
INTRAMUSCULAR | Status: AC
  Filled 2018-02-14: qty 30

## 2018-02-14 MED ORDER — PHENYLEPHRINE 40 MCG/ML (10ML) SYRINGE FOR IV PUSH (FOR BLOOD PRESSURE SUPPORT): 80.0000 ug | PREFILLED_SYRINGE | INTRAVENOUS | Status: DC | PRN

## 2018-02-14 MED ORDER — AMMONIA AROMATIC IN INHA
RESPIRATORY_TRACT | Status: AC
  Filled 2018-02-14: qty 10

## 2018-02-14 MED ORDER — MISOPROSTOL 25 MCG QUARTER TABLET
25.0000 ug | ORAL_TABLET | ORAL | Status: DC | PRN
  Administered 2018-02-14 (×2): 25 ug via VAGINAL
  Filled 2018-02-14 (×2): qty 1

## 2018-02-14 MED ORDER — FENTANYL 2.5 MCG/ML W/ROPIVACAINE 0.15% IN NS 100 ML EPIDURAL (ARMC)
EPIDURAL | Status: AC
  Filled 2018-02-14: qty 100

## 2018-02-14 MED ORDER — SODIUM CHLORIDE 0.9 % IV SOLN
INTRAVENOUS | Status: DC | PRN
  Administered 2018-02-14 (×2): 5 mL via EPIDURAL

## 2018-02-14 MED ORDER — LACTATED RINGERS IV SOLN
500.0000 mL | Freq: Once | INTRAVENOUS | Status: AC
  Administered 2018-02-14: 500 mL via INTRAVENOUS

## 2018-02-14 MED ORDER — OXYTOCIN 10 UNIT/ML IJ SOLN
INTRAMUSCULAR | Status: AC
  Filled 2018-02-14: qty 2

## 2018-02-14 MED ORDER — DIPHENHYDRAMINE HCL 50 MG/ML IJ SOLN
25.0000 mg | INTRAMUSCULAR | Status: DC | PRN
  Administered 2018-02-14: 25 mg via INTRAVENOUS
  Filled 2018-02-14: qty 1

## 2018-02-14 MED ORDER — LIDOCAINE HCL (PF) 1 % IJ SOLN
INTRAMUSCULAR | Status: DC | PRN
  Administered 2018-02-14: 1 mL via INTRADERMAL

## 2018-02-14 MED ORDER — MISOPROSTOL 200 MCG PO TABS
ORAL_TABLET | ORAL | Status: AC
  Administered 2018-02-14: 25 ug via VAGINAL
  Filled 2018-02-14: qty 4

## 2018-02-14 MED ORDER — BUTORPHANOL TARTRATE 2 MG/ML IJ SOLN: 1.0000 mg | INTRAMUSCULAR | Status: DC | PRN

## 2018-02-14 MED ORDER — LIDOCAINE-EPINEPHRINE (PF) 1.5 %-1:200000 IJ SOLN
INTRAMUSCULAR | Status: DC | PRN
  Administered 2018-02-14: 3 mL via PERINEURAL

## 2018-02-14 MED ORDER — FENTANYL 2.5 MCG/ML W/ROPIVACAINE 0.15% IN NS 100 ML EPIDURAL (ARMC)
12.0000 mL/h | EPIDURAL | Status: DC
  Administered 2018-02-14 (×3): 12 mL/h via EPIDURAL
  Filled 2018-02-14 (×2): qty 100

## 2018-02-14 MED ORDER — EPHEDRINE 5 MG/ML INJ: 10.0000 mg | INTRAVENOUS | Status: DC | PRN

## 2018-02-14 MED ORDER — OXYTOCIN BOLUS FROM INFUSION
500.0000 mL | Freq: Once | INTRAVENOUS | Status: AC
  Administered 2018-02-14: 500 mL via INTRAVENOUS

## 2018-02-14 MED ORDER — LACTATED RINGERS IV SOLN
INTRAVENOUS | Status: DC
  Administered 2018-02-14 (×2): via INTRAVENOUS

## 2018-02-14 MED ORDER — LACTATED RINGERS IV SOLN: 500.0000 mL | INTRAVENOUS | Status: DC | PRN

## 2018-02-14 MED ORDER — MISOPROSTOL 25 MCG QUARTER TABLET
25.0000 ug | ORAL_TABLET | Freq: Once | ORAL | Status: AC
  Administered 2018-02-14: 25 ug via BUCCAL
  Filled 2018-02-14: qty 1

## 2018-02-14 MED ORDER — LIDOCAINE HCL (PF) 1 % IJ SOLN: 30.0000 mL | INTRAMUSCULAR | Status: DC | PRN

## 2018-02-14 MED ORDER — SOD CITRATE-CITRIC ACID 500-334 MG/5ML PO SOLN: 30.0000 mL | ORAL | Status: DC | PRN

## 2018-02-14 MED ORDER — OXYTOCIN 40 UNITS IN LACTATED RINGERS INFUSION - SIMPLE MED
1.0000 m[IU]/min | INTRAVENOUS | Status: DC
  Administered 2018-02-14: 1 m[IU]/min via INTRAVENOUS

## 2018-02-14 MED ORDER — ACETAMINOPHEN 325 MG PO TABS: 650.0000 mg | ORAL_TABLET | ORAL | Status: DC | PRN

## 2018-02-14 MED ORDER — OXYTOCIN 40 UNITS IN LACTATED RINGERS INFUSION - SIMPLE MED
2.5000 [IU]/h | INTRAVENOUS | Status: DC
  Filled 2018-02-14: qty 1000

## 2018-02-14 MED ORDER — TERBUTALINE SULFATE 1 MG/ML IJ SOLN: 0.2500 mg | Freq: Once | INTRAMUSCULAR | Status: DC | PRN

## 2018-02-14 NOTE — Progress Notes (Signed)
Labor Progress Note  Teresa Meza is a 37 y.o. [id number] at 7042w0d by 5785w2d ultrasound admitted for induction of labor due to preeclampsia without severe features.  Subjective:  Resting comfortably in bed, epidural in place.   Objective: BP 110/64   Pulse (!) 101   Temp 98 F (36.7 C) (Oral)   Resp 18   Ht 5' 3.5" (1.613 m)   Wt 103.9 kg (229 lb)   LMP 05/14/2017   SpO2 100%   BMI 39.93 kg/m  Notable VS details: Normal to mild range BPs  Fetal Assessment: FHT:  FHR: 130 bpm, variability: moderate,  accelerations:  Present,  decelerations:  Present variable Category/reactivity:  Category II UC:  regular, every 2-4 minutes SVE:   Per RN Dahlia ClientHannah Cahoon Dilation: 3cm  Effacement: 50%  Station:  Floating  Consistency: medium  Position: middle  Membrane status: intact Amniotic color: n/a  Labs: Lab Results  Component Value Date   WBC 7.2 02/14/2018   HGB 10.4 (L) 02/14/2018   HCT 31.0 (L) 02/14/2018   MCV 78.3 (L) 02/14/2018   PLT 281 02/14/2018    Assessment / Plan: Induction of labor due to preeclampsia without severe features, latent labor  Labor: s/p 2 doses of misoprostol, contracting regularly Preeclampsia:  no signs or symptoms of toxicity and labs stable Fetal Wellbeing:  Category II for non-recurrent variable decels, overall reassuring  Pain Control:  Epidural Anticipated MOD:  NSVD  Teresa Meza, CNM 02/14/2018, 10:45 AM

## 2018-02-14 NOTE — Anesthesia Preprocedure Evaluation (Signed)
Anesthesia Evaluation  Patient identified by MRN, date of birth, ID band Patient awake    Reviewed: Allergy & Precautions, H&P , NPO status , Patient's Chart, lab work & pertinent test results  History of Anesthesia Complications Negative for: history of anesthetic complications  Airway Mallampati: III  TM Distance: >3 FB Neck ROM: full    Dental  (+) Chipped   Pulmonary neg pulmonary ROS,           Cardiovascular Exercise Tolerance: Good hypertension,      Neuro/Psych    GI/Hepatic negative GI ROS,   Endo/Other  diabetes  Renal/GU   negative genitourinary   Musculoskeletal   Abdominal   Peds  Hematology negative hematology ROS (+)   Anesthesia Other Findings Past Medical History: No date: Ectopic fetus 08/26/2017: Elderly multigravida     Comment:  ( ) NIPT ( ) NST/AFI weekly starting at 36 weeks 02/10/2018: Elevated blood pressure reading without diagnosis of  hypertension  Past Surgical History: No date: ECTOPIC PREGNANCY SURGERY  BMI    Body Mass Index:  39.93 kg/m      Reproductive/Obstetrics (+) Pregnancy                             Anesthesia Physical Anesthesia Plan  ASA: III  Anesthesia Plan: Epidural   Post-op Pain Management:    Induction:   PONV Risk Score and Plan:   Airway Management Planned:   Additional Equipment:   Intra-op Plan:   Post-operative Plan:   Informed Consent: I have reviewed the patients History and Physical, chart, labs and discussed the procedure including the risks, benefits and alternatives for the proposed anesthesia with the patient or authorized representative who has indicated his/her understanding and acceptance.     Plan Discussed with: Anesthesiologist  Anesthesia Plan Comments:         Anesthesia Quick Evaluation

## 2018-02-14 NOTE — Progress Notes (Signed)
Labor Progress Note  Teresa Meza is a 37 y.o. [id number] at 3158w0d by 4148w2d ultrasound admitted for induction of labor due to preeclampsia without severe features.  Subjective:  Resting in bed. Reports feeling increased pressure.   Objective: BP (!) 143/80   Pulse (!) 110   Temp 99.5 F (37.5 C) (Oral)   Resp 16   Ht 5' 3.5" (1.613 m)   Wt 103.9 kg (229 lb)   LMP 05/14/2017   SpO2 100%   BMI 39.93 kg/m  Notable VS details: Normal to mild range BPs  Fetal Assessment: FHT:  FHR: 140 bpm, variability: moderate,  accelerations:  Present,  decelerations:  Present early, variable, late Category/reactivity:  Category II UC:  regular, every 1.5-2 minutes SVE:    Dilation: complete  Effacement: complete  Station:  +1  Membrane status: AROM 2022 Amniotic color: clear  Labs: Lab Results  Component Value Date   WBC 7.2 02/14/2018   HGB 10.4 (L) 02/14/2018   HCT 31.0 (L) 02/14/2018   MCV 78.3 (L) 02/14/2018   PLT 281 02/14/2018    Assessment / Plan: Induction of labor due to preeclampsia without severe features, active labor  Labor: s/p 2 doses of misoprostol, contracting regularly, Pitocin infusing at 846mu/min, complete Preeclampsia:  no signs or symptoms of toxicity and labs stable Fetal Wellbeing:  Category II for non-recurrent variable and late decels, overall reassuring  Pain Control:  Epidural Anticipated MOD:  NSVD  Genia DelMargaret Ria Redcay, CNM 02/14/2018, 10:33 PM

## 2018-02-14 NOTE — Discharge Summary (Addendum)
Obstetrical Discharge Summary  Patient Name: Teresa Meza DOB: 04-13-1981 MRN: [id number]  Date of Admission: 02/11/2018 Date of Delivery: 02/14/18 Delivered by: Teresa Bockchelsea ward, MD Date of Discharge: 02/15/2018  Primary OB: Teresa Meza   WUJ:[id number]:Patient's last menstrual period was 05/14/2017. EDC Estimated Date of Delivery: 03/07/18 Gestational Age at Delivery: 6225w0d   Antepartum complications:   1. AMA 2. Gestational diabetes, oral meds 3. Preeclampsia 4. Obesity 5. Cellulitis   Admitting Diagnosis: cellulitis and IOL for preeclampsia Secondary Diagnosis: Patient Active Problem List   Diagnosis Date Noted  . Encounter for induction of labor 02/14/2018  . Abdominal wall cellulitis 02/11/2018  . Abdominal pain 02/10/2018  . Anemia 02/10/2018  . Preeclampsia, third trimester 02/10/2018  . Indication for care in labor and delivery, antepartum 01/24/2018  . Pregnancy 01/24/2018  . Cough 11/07/2017  . Labor and delivery indication for care or intervention 10/24/2017  . Advanced maternal age in multigravida, unspecified trimester 10/23/2017  . Gestational diabetes mellitus (GDM) affecting fourth pregnancy 10/14/2017  . BMI 37.0-37.9, adult 10/08/2017  . Obesity in pregnancy 10/08/2017  . Supervision of high risk pregnancy, antepartum 08/26/2017  . Hx of gestational diabetes in prior pregnancy, currently pregnant 08/26/2017    Augmentation: AROM, Pitocin and Cytotec Complications: None Intrapartum complications/course:  Mom presented to L&D with IOL for preeclampsia at term. She was given cytotec, augmented with pitocin, and AROM'd.  epidual placed. Progressed to complete, second stage: <315min with delivery of fetal head with restitution to LOt   Anterior then posterior shoulders delivered without difficulty.  Baby placed on mom's chest, and attended to by peds.  Cord was then clamped and cut after a >60sec delay by FOB.  Placenta spontaneously delivered, intact.  IV pitocin  given for hemorrhage prophylaxis. 1st degree laceration repaired in standard fashion with 3-0 vicryl rapide.  Date of Delivery: 02/14/18 Delivered By: Teresa Meza Delivery Type: spontaneous vaginal delivery Anesthesia: epidural Placenta: sponatneous Laceration: 1st degree Episiotomy: none Newborn Data: Live born female  "Teresa Meza" Birth Weight:   APGAR: 9, 9  Newborn Delivery   Birth date/time:  02/14/2018 22:55:00 Delivery type:  Vaginal, Spontaneous     Postpartum Procedures: Antibiotics for cellulitis IV  Post partum course: PPD#2 Patient had an uncomplicated postpartum course.  By time of discharge on PPD#2, her pain was controlled on oral pain medications; she had appropriate lochia and was ambulating, voiding without difficulty and tolerating regular diet.  She was deemed stable for discharge to home.  Abd erythema and discomfort was minimal compared to admission.   Discharge Physical Exam:  BP 129/87 (BP Location: Left Arm)   Pulse (!) 106   Temp 98.2 F (36.8 C) (Oral)   Resp 20   Ht 5' 3.5" (1.613 m)   Wt 103.9 kg (229 lb)   LMP 05/14/2017   SpO2 99%   Breastfeeding? Unknown   BMI 39.93 kg/m   General: NAD CV: RRR Pulm: CTABL, nl effort ABD: s/nd/nt, fundus firm and below the umbilicus Lochia: moderate DVT Evaluation: LE non-ttp, no evidence of DVT on exam. Abd: decreased erythema and pain, cool to touch, no abcess formation.  Hemoglobin  Date Value Ref Range Status  02/15/2018 10.2 (L) 12.0 - 16.0 g/dL Final  29/56/213002/11/2017 86.513.4 11.1 - 15.9 g/dL Final   HCT  Date Value Ref Range Status  02/15/2018 31.0 (L) 35.0 - 47.0 % Final   Hematocrit  Date Value Ref Range Status  08/26/2017 41.3 34.0 - 46.6 % Final  Disposition: stable, discharge to home. Baby Feeding: breastmilk Baby Disposition: home with mom  Rh Immune globulin given: n/a Rubella vaccine given: n/a Tdap vaccine given in AP or PP setting: AP Flu vaccine given in AP or PP setting:  n/a  Contraception: nexplanon  Prenatal Labs:  Blood type/Rh B+  Antibody screen neg  Rubella Immune  Varicella Immune  RPR NR  HBsAg Neg  HIV NR  GC neg  Chlamydia neg  Genetic screening negative  Early 1 hour GTT 183  3 hour GTT 107, 183, 197, 151  GBS negative     Plan:  Teresa Meza was discharged to home in good condition. Follow-up appointment with Dr Elesa Massed in 1 week to check abd  Needs 2 h pp glucola at 6 weeks to check for DM Discharge Medications: Keflex 500 mg po 4 x a day for the next 8 days, Ibuprofen, Iron supplement and Prenatal vitamins  Follow-up Information    Meza, Elenora Fender, MD Follow up in 6 week(s).   Specialty:  Obstetrics and Gynecology Contact information: [address] [place] [address] [phone]           Signed: Myrtie Cruise, MSN, CNM, FNP Certified Nurse Midwife [facility]

## 2018-02-14 NOTE — Progress Notes (Signed)
Labor Progress Note  Teresa Meza is a 37 y.o. [id number] at 735w0d by 845w2d ultrasound admitted for induction of labor due to preeclampsia without severe features.  Subjective:  Resting comfortably in bed, epidural in place. Sitting in high-fowler's.   Objective: BP 108/67   Pulse (!) 104   Temp 98.4 F (36.9 C) (Oral)   Resp 18   Ht 5' 3.5" (1.613 m)   Wt 103.9 kg (229 lb)   LMP 05/14/2017   SpO2 100%   BMI 39.93 kg/m  Notable VS details: Normal to mild range BPs  Fetal Assessment: FHT:  FHR: 135 bpm, variability: moderate,  accelerations:  Present,  decelerations:  Absent Category/reactivity:  Category I UC:  regular, every 2-4 minutes SVE:    Dilation: 3cm  Effacement: 70%  Station:  -3  Consistency: medium  Position: middle  Membrane status: intact Amniotic color: n/a  Labs: Lab Results  Component Value Date   WBC 7.2 02/14/2018   HGB 10.4 (L) 02/14/2018   HCT 31.0 (L) 02/14/2018   MCV 78.3 (L) 02/14/2018   PLT 281 02/14/2018    Assessment / Plan: Induction of labor due to preeclampsia without severe features, latent labor  Labor: s/p 2 doses of misoprostol, contracting regularly, Pitocin infusing at 556mu/min, continue to titrate per protocol Preeclampsia:  no signs or symptoms of toxicity and labs stable Fetal Wellbeing:  Category I  Pain Control:  Epidural Anticipated MOD:  NSVD  Genia DelMargaret Lopaka Karge, CNM 02/14/2018, 5:11 PM

## 2018-02-14 NOTE — Progress Notes (Signed)
Labor Progress Note  Teresa FeyLatasha L Meza is a 37 y.o. [id number] at 5971w0d by 5757w2d ultrasound admitted for induction of labor due to preeclampsia without severe features.  Subjective:  Resting in bed. Reports some generalized itching, improved with IV Benadryl.   Objective: BP 138/72 (BP Location: Left Arm)   Pulse (!) 119   Temp 98.2 F (36.8 C) (Oral)   Resp 18   Ht 5' 3.5" (1.613 m)   Wt 103.9 kg (229 lb)   LMP 05/14/2017   SpO2 100%   BMI 39.93 kg/m  Notable VS details: Normal to mild range BPs  Fetal Assessment: FHT:  FHR: 140 bpm, variability: moderate,  accelerations:  Present,  decelerations:  Present early, variable, late Category/reactivity:  Category II UC:  regular, every 2-4 minutes SVE:    Dilation: 4cm  Effacement: 70%  Station:  -2  Consistency: soft  Position: anterior  Membrane status: AROM 2022 Amniotic color: clear  Labs: Lab Results  Component Value Date   WBC 7.2 02/14/2018   HGB 10.4 (L) 02/14/2018   HCT 31.0 (L) 02/14/2018   MCV 78.3 (L) 02/14/2018   PLT 281 02/14/2018    Assessment / Plan: Induction of labor due to preeclampsia without severe features, latent labor  Labor: s/p 2 doses of misoprostol, contracting regularly, Pitocin infusing at 6712mu/min, contracting regularly Preeclampsia:  no signs or symptoms of toxicity and labs stable Fetal Wellbeing:  Category II for non-recurrent variable and late decels, overall reassuring  Pain Control:  Epidural Anticipated MOD:  NSVD  Teresa Meza, CNM 02/14/2018, 8:28 PM

## 2018-02-14 NOTE — Anesthesia Procedure Notes (Signed)
Epidural Patient location during procedure: OB Start time: 02/14/2018 8:27 AM End time: 02/14/2018 8:30 AM  Staffing Anesthesiologist: Piscitello, Cleda MccreedyJoseph K, MD Performed: anesthesiologist   Preanesthetic Checklist Completed: patient identified, site marked, surgical consent, pre-op evaluation, timeout performed, IV checked, risks and benefits discussed and monitors and equipment checked  Epidural Patient position: sitting Prep: ChloraPrep Patient monitoring: heart rate, continuous pulse ox and blood pressure Approach: midline Location: L3-L4 Injection technique: LOR saline  Needle:  Needle type: Tuohy  Needle gauge: 17 G Needle length: 9 cm and 9 Needle insertion depth: 6 cm Catheter type: closed end flexible Catheter size: 19 Gauge Catheter at skin depth: 11 cm Test dose: negative and 1.5% lidocaine with Epi 1:200 K  Assessment Sensory level: T10 Events: blood not aspirated, injection not painful, no injection resistance, negative IV test and no paresthesia  Additional Notes 1 attempt Pt. Evaluated and documentation done after procedure finished. Patient identified. Risks/Benefits/Options discussed with patient including but not limited to bleeding, infection, nerve damage, paralysis, failed block, incomplete pain control, headache, blood pressure changes, nausea, vomiting, reactions to medication both or allergic, itching and postpartum back pain. Confirmed with bedside nurse the patient's most recent platelet count. Confirmed with patient that they are not currently taking any anticoagulation, have any bleeding history or any family history of bleeding disorders. Patient expressed understanding and wished to proceed. All questions were answered. Sterile technique was used throughout the entire procedure. Please see nursing notes for vital signs. Test dose was given through epidural catheter and negative prior to continuing to dose epidural or start infusion. Warning signs of  high block given to the patient including shortness of breath, tingling/numbness in hands, complete motor block, or any concerning symptoms with instructions to call for help. Patient was given instructions on fall risk and not to get out of bed. All questions and concerns addressed with instructions to call with any issues or inadequate analgesia.   Patient tolerated the insertion well without immediate complications.Reason for block:procedure for pain

## 2018-02-14 NOTE — Progress Notes (Addendum)
Labor Progress Note  Gertie FeyLatasha L Calef is a 37 y.o. [id number] at 416w0d by 5769w2d ultrasound admitted for induction of labor due to preeclampsia without severe features.  Subjective:  Resting comfortably in bed, epidural in place.   Objective: BP 108/67   Pulse (!) 104   Temp 98 F (36.7 C) (Oral)   Resp 18   Ht 5' 3.5" (1.613 m)   Wt 103.9 kg (229 lb)   LMP 05/14/2017   SpO2 100%   BMI 39.93 kg/m  Notable VS details: Normal to mild range BPs  Fetal Assessment: FHT:  FHR: 135 bpm, variability: moderate,  accelerations:  Present,  decelerations:  Present variable Category/reactivity:  Category II UC:  regular, every 2-5 minutes SVE:    Dilation: 3cm  Effacement: 60%  Station:  -3  Consistency: medium  Position: middle  Membrane status: intact Amniotic color: n/a  Labs: Lab Results  Component Value Date   WBC 7.2 02/14/2018   HGB 10.4 (L) 02/14/2018   HCT 31.0 (L) 02/14/2018   MCV 78.3 (L) 02/14/2018   PLT 281 02/14/2018    Assessment / Plan: Induction of labor due to preeclampsia without severe features, latent labor  Labor: s/p 2 doses of misoprostol, contracting regularly, plan to start Pitocin now and titrate per protocl Preeclampsia:  no signs or symptoms of toxicity and labs stable Fetal Wellbeing:  Category II for non-recurrent variable decels, overall reassuring  Pain Control:  Epidural Anticipated MOD:  NSVD  Genia DelMargaret Dejai Schubach, CNM 02/14/2018, 2:20 PM

## 2018-02-15 LAB — GLUCOSE, CAPILLARY
GLUCOSE-CAPILLARY: 90 mg/dL (ref 70–99)
GLUCOSE-CAPILLARY: 124 mg/dL — AB (ref 70–99)
GLUCOSE-CAPILLARY: 118 mg/dL — AB (ref 70–99)

## 2018-02-15 LAB — CBC
HCT: 31 % — ABNORMAL LOW (ref 35.0–47.0)
Hemoglobin: 10.2 g/dL — ABNORMAL LOW (ref 12.0–16.0)
MCH: 25.9 pg — AB (ref 26.0–34.0)
MCHC: 33 g/dL (ref 32.0–36.0)
MCV: 78.3 fL — ABNORMAL LOW (ref 80.0–100.0)
PLATELETS: 271 10*3/uL (ref 150–440)
RBC: 3.96 MIL/uL (ref 3.80–5.20)
RDW: 14.9 % — AB (ref 11.5–14.5)
WBC: 10 10*3/uL (ref 3.6–11.0)

## 2018-02-15 MED ORDER — ACETAMINOPHEN 325 MG PO TABS: 650.0000 mg | ORAL_TABLET | ORAL | Status: DC | PRN

## 2018-02-15 MED ORDER — ONDANSETRON HCL 4 MG/2ML IJ SOLN: 4.0000 mg | INTRAMUSCULAR | Status: DC | PRN

## 2018-02-15 MED ORDER — SIMETHICONE 80 MG PO CHEW: 160.0000 mg | CHEWABLE_TABLET | ORAL | Status: DC | PRN

## 2018-02-15 MED ORDER — COCONUT OIL OIL: 1.0000 "application " | TOPICAL_OIL | Status: DC | PRN

## 2018-02-15 MED ORDER — IBUPROFEN 600 MG PO TABS
ORAL_TABLET | ORAL | Status: AC
  Filled 2018-02-15: qty 1

## 2018-02-15 MED ORDER — PRENATAL MULTIVITAMIN CH
1.0000 | ORAL_TABLET | Freq: Every day | ORAL | Status: DC
  Administered 2018-02-15 – 2018-02-16 (×2): 1 via ORAL
  Filled 2018-02-15 (×2): qty 1

## 2018-02-15 MED ORDER — ONDANSETRON HCL 4 MG PO TABS: 4.0000 mg | ORAL_TABLET | ORAL | Status: DC | PRN

## 2018-02-15 MED ORDER — BENZOCAINE-MENTHOL 20-0.5 % EX AERO: 1.0000 "application " | INHALATION_SPRAY | CUTANEOUS | Status: DC | PRN

## 2018-02-15 MED ORDER — SENNOSIDES-DOCUSATE SODIUM 8.6-50 MG PO TABS
2.0000 | ORAL_TABLET | ORAL | Status: DC
  Administered 2018-02-15: 2 via ORAL
  Filled 2018-02-15: qty 2

## 2018-02-15 MED ORDER — SODIUM CHLORIDE 0.9 % IV SOLN
INTRAVENOUS | Status: DC | PRN
  Administered 2018-02-15: 1000 mL via INTRAVENOUS

## 2018-02-15 MED ORDER — WITCH HAZEL-GLYCERIN EX PADS: 1.0000 "application " | MEDICATED_PAD | CUTANEOUS | Status: DC | PRN

## 2018-02-15 MED ORDER — DIPHENHYDRAMINE HCL 25 MG PO CAPS: 25.0000 mg | ORAL_CAPSULE | Freq: Four times a day (QID) | ORAL | Status: DC | PRN

## 2018-02-15 MED ORDER — DIBUCAINE 1 % RE OINT: 1.0000 "application " | TOPICAL_OINTMENT | RECTAL | Status: DC | PRN

## 2018-02-15 MED ORDER — IBUPROFEN 600 MG PO TABS
600.0000 mg | ORAL_TABLET | Freq: Four times a day (QID) | ORAL | Status: DC
  Administered 2018-02-15 – 2018-02-16 (×7): 600 mg via ORAL
  Filled 2018-02-15 (×7): qty 1

## 2018-02-15 NOTE — Plan of Care (Signed)
Transferred to room 343 Post Partum. Oriented to room, Plan of care and [place] policy. Pt. V/O. See complete assessment.

## 2018-02-15 NOTE — Anesthesia Postprocedure Evaluation (Signed)
Anesthesia Post Note  Patient: Teresa Meza  Procedure(s) Performed: AN AD HOC LABOR EPIDURAL  Patient location during evaluation: Mother Baby Anesthesia Type: Epidural Level of consciousness: awake and alert Pain management: pain level controlled Vital Signs Assessment: post-procedure vital signs reviewed and stable Respiratory status: spontaneous breathing, nonlabored ventilation and respiratory function stable Cardiovascular status: stable Postop Assessment: no headache, no backache, able to ambulate and adequate PO intake Anesthetic complications: no     Last Vitals:  Vitals:   02/15/18 1152 02/15/18 1527  BP: (!) 130/97 125/86  Pulse: 99 97  Resp: 20 19  Temp: 37.3 C 37.1 C  SpO2:  98%    Last Pain:  Vitals:   02/15/18 1527  TempSrc: Oral  PainSc:                  Lenard SimmerAndrew Lyniah Fujita

## 2018-02-15 NOTE — Progress Notes (Signed)
Post Partum Day 1  Subjective: Doing well, having some abdominal and back pain. Ambulating without difficulty, pain managed with PO meds, tolerating regular diet, and voiding without difficulty.   No fever/chills, chest pain, shortness of breath, nausea/vomiting, or leg pain. No nipple or breast pain. No headache, visual changes, or RUQ/epigastric pain.   Objective: BP (!) 143/96 (BP Location: Left Arm) Comment: nurse Stanton KidneyKiara Thomas notified  Pulse 92   Temp 98.9 F (37.2 C) (Oral)   Resp (!) 24 Comment: nurse Stanton KidneyKiara Thomas notified  Ht 5' 3.5" (1.613 m)   Wt 103.9 kg (229 lb)   LMP 05/14/2017   SpO2 96%   Breastfeeding? Unknown   BMI 39.93 kg/m    Physical Exam:  General: alert, cooperative, appears stated age and no distress Breasts: soft/nontender CV: RRR Pulm: nl effort, CTABL Abdomen: soft, tender to palpation over area of cellulitis, active bowel sounds, cellulitis smaller than yesterday with erythema improved and continued edema Uterine Fundus: firm Lochia: appropriate DVT Evaluation: No evidence of DVT seen on physical exam. No cords or calf tenderness. No significant calf/ankle edema.  Recent Labs    02/14/18 0606 02/15/18 0545  HGB 10.4* 10.2*  HCT 31.0* 31.0*  WBC 7.2 10.0  PLT 281 271    Assessment/Plan: 37 y.o. [id number] postpartum day # 1  -Continue routine PP care -Lactation consult PRN for breastfeeding support.  -Plans Nexplanon for contraception.  -Acute blood loss anemia - hemodynamically stable and asymptomatic; continue daily iron supplement with stool softeners  -Immunization status: all immunizations up to date -Continue IV antibiotics for cellulitis, with plans to convert to PO for discharge.   Disposition: Continue inpatient postpartum care.   LOS: 2 days   Genia DelMargaret Alexee Delsanto, CNM 02/15/2018, 9:04 AM   ----- Genia DelMargaret Mataeo Ingwersen Certified Nurse Midwife [facility]

## 2018-02-15 NOTE — Lactation Note (Signed)
This note was copied from a baby's chart. Lactation Consultation Note  Patient Name: Teresa Lethea KillingsLatasha Dragovich [id number]'s Date: 02/15/2018 Reason for consult: Early term 37-38.6wks;Follow-up assessment;Mother's request Mom called for assistance with latch.  [name] was sleepy.  Large stool changed.  Waking techniques demonstrated.  Once we got him awake, he began good rhythmic sucking with occasional swallows on left breast.  Mom is more comfortable in football hold for now.  Mom continued to massage breast and stimulate Jacquenette ShoneJulian for 30 minutes to have productive breast feed.  Encouraged mom to call if further assistance needed.  Maternal Data Formula Feeding for Exclusion: No Has patient been taught Hand Expression?: Yes Does the patient have breastfeeding experience prior to this delivery?: Yes  Feeding Feeding Type: Breast Fed Length of feed: 30 min  LATCH Score Latch: Repeated attempts needed to sustain latch, nipple held in mouth throughout feeding, stimulation needed to elicit sucking reflex.  Audible Swallowing: A few with stimulation  Type of Nipple: Everted at rest and after stimulation  Comfort (Breast/Nipple): Soft / non-tender  Hold (Positioning): Assistance needed to correctly position infant at breast and maintain latch.  LATCH Score: 7  Interventions Interventions: Assisted with latch;Breast compression;Support pillows  Lactation Tools Discussed/Used WIC Program: Yes   Consult Status Consult Status: PRN Follow-up type: Call as needed    Louis MeckelWilliams, Alexina Niccoli Kay 02/15/2018, 6:49 PM

## 2018-02-15 NOTE — Lactation Note (Signed)
This note was copied from a baby's chart. Lactation Consultation Note  Patient Name: Teresa Lethea KillingsLatasha Tamargo [id number]'s Date: 02/15/2018  Mom struggling to get Teresa Meza to open wide for a deep enough latch to the breast. Demonstrated hand expression of colostrum and tickled lips several times before finally getting mouth wide open with flanged lips to obtain and sustain latch.  Once he felt it in his mouth, he began strong rhythmic sucking with occasional swallow.  Repositioned a few times during feeding, because mom had tendency to let him get shallow latch.  Mom is experienced breast feeder.  She breast fed other 2 for 6 months.  Reviewed supply and demand, normal course of lactation and routine newborn feeding patterns.  Lactation name and number written on white board and encouraged to call with any questions, concerns or assistance.   Maternal Data    Feeding Feeding Type: Breast Fed Length of feed: 30 min  LATCH Score                   Interventions    Lactation Tools Discussed/Used     Consult Status      Louis MeckelWilliams, Yessica Putnam Kay 02/15/2018, 6:30 PM

## 2018-02-15 NOTE — Progress Notes (Signed)
Lab results showing capillary glucose of 49 at 0648. Per night shift RN, this result is on baby boy Teresa Meza and not mom. RN called IT to remove this result but IT had no success.

## 2018-02-16 LAB — GLUCOSE, CAPILLARY
GLUCOSE-CAPILLARY: 49 mg/dL — AB (ref 70–99)
Glucose-Capillary: 91 mg/dL (ref 70–99)

## 2018-02-16 LAB — RPR: RPR Ser Ql: NONREACTIVE

## 2018-02-16 MED ORDER — CEPHALEXIN 500 MG PO CAPS: 500.0000 mg | ORAL_CAPSULE | Freq: Four times a day (QID) | ORAL | 0 refills | Status: AC

## 2018-02-16 NOTE — Discharge Instructions (Signed)
Please call your doctor or return to the ER if you experience any chest pains, shortness of breath, dizziness, visual changes, fever greater than 101, any heavy bleeding (saturating more than 1 pad per hour), large clots, or foul smelling discharge, any worsening abdominal pain and cramping that is not controlled by pain medication, or any signs of postpartum depression. No tampons, enemas, douches, or sexual intercourse for 6 weeks. Also avoid tub baths, hot tubs, or swimming for 6 weeks.  °

## 2018-02-16 NOTE — Progress Notes (Signed)
Discharge order received from doctor. Reviewed discharge instructions with patient and answered all questions. Follow up appointment given. Patient verbalized understanding. ID bands checked. Patient discharged home with infant via wheelchair by nursing/auxillary.    Greig Altergott Garner, RN  

## 2018-02-16 NOTE — Progress Notes (Signed)
Post Partum Day 2 Subjective: no complaints, up ad lib, voiding and tolerating PO  Objective: Blood pressure 121/72, pulse 94, temperature 98.5 F (36.9 C), temperature source Oral, resp. rate 18, height 5' 3.5" (1.613 m), weight 103.9 kg (229 lb), last menstrual period 05/14/2017, SpO2 98 %, unknown if currently breastfeeding.  Physical Exam:  General: alert, cooperative and appears stated age Lochia: no clots, mod Uterine Fundus: firm Incision: healing well DVT Evaluation: No evidence of DVT seen on physical exam.  Recent Labs    02/14/18 0606 02/15/18 0545  HGB 10.4* 10.2*  HCT 31.0* 31.0*    Assessment/Plan: A:1. CULLIITIS IMPROVED 2. PPD#2 P: 1. DC home on po antibiotics. FU in 1 weeks for office visit Myrtie Cruisearon W. Lui Bellis,RN, MSN, CNM, FNP Certified Nurse Midwife [facility]   LOS: 3 days   Sharee PimpleCaron W Kamarius Buckbee 02/16/2018, 8:35 AM

## 2018-05-10 ENCOUNTER — Emergency Department: Payer: Self-pay

## 2018-05-10 ENCOUNTER — Emergency Department
Admission: EM | Admit: 2018-05-10 | Discharge: 2018-05-10 | Disposition: A | Discharge status: Home / Self Care | Payer: Self-pay | Attending: Emergency Medicine | Admitting: Emergency Medicine

## 2018-05-10 ENCOUNTER — Other Ambulatory Visit: Payer: Self-pay

## 2018-05-10 DIAGNOSIS — J069 Acute upper respiratory infection, unspecified: Secondary | ICD-10-CM

## 2018-05-10 DIAGNOSIS — B9789 Other viral agents as the cause of diseases classified elsewhere: Secondary | ICD-10-CM

## 2018-05-10 DIAGNOSIS — J029 Acute pharyngitis, unspecified: Secondary | ICD-10-CM

## 2018-05-10 DIAGNOSIS — Z79899 Other long term (current) drug therapy: Secondary | ICD-10-CM | POA: Insufficient documentation

## 2018-05-10 LAB — GROUP A STREP BY PCR: Group A Strep by PCR: NOT DETECTED

## 2018-05-10 IMAGING — CR DG CHEST 2V
2 series · 2 of 2 positions shown · non-contrast
Comparison: [DATE]

CLINICAL DATA: Sore throat since yesterday.

EXAM:
CHEST - 2 VIEW

[chest pa]
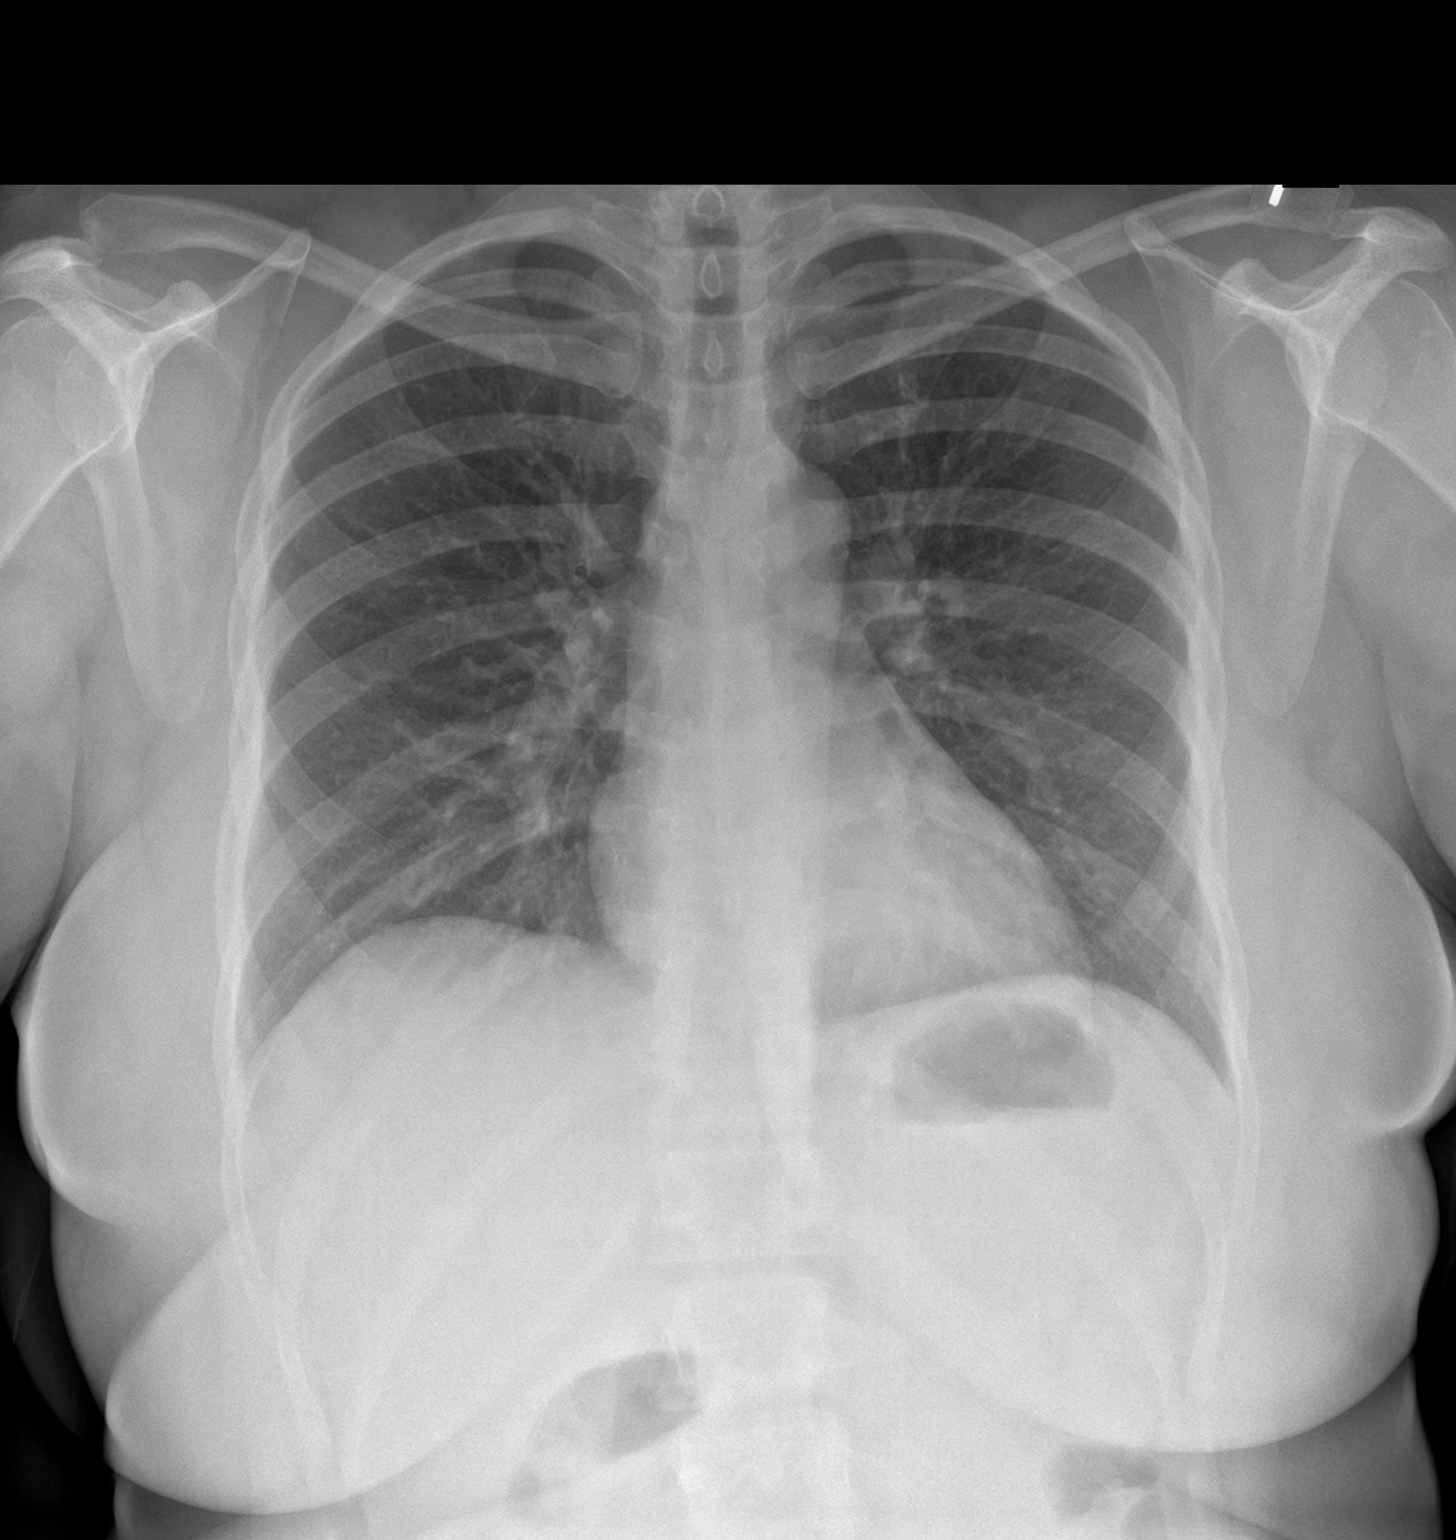

[chest lat]
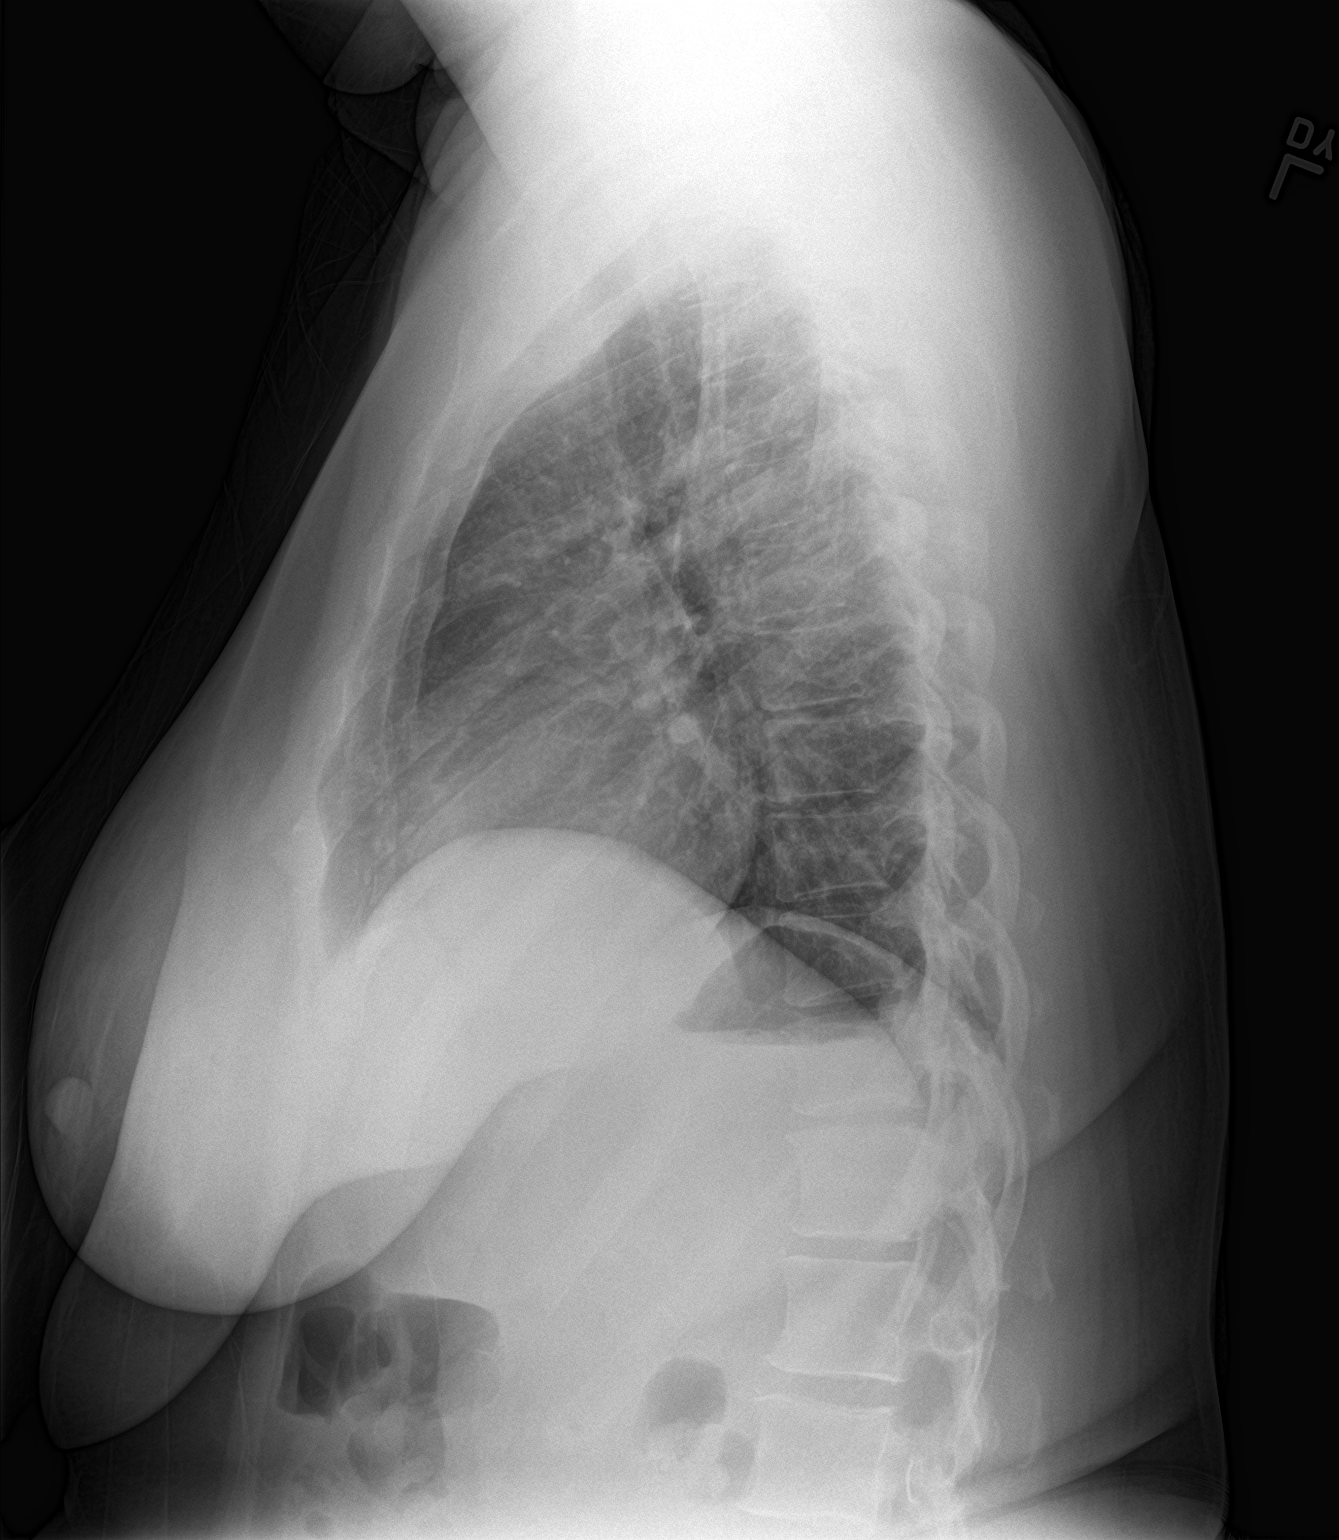

[2 of 2 positions shown; findings below may reference images not displayed]

FINDINGS: Lungs are adequately inflated without consolidation or effusion.
Cardiomediastinal silhouette and remainder of the exam is unchanged.
IMPRESSION: No active cardiopulmonary disease.

## 2018-05-10 NOTE — Discharge Instructions (Signed)
You were evaluated for congestion, sore throat, fever, and cough and your exam and evaluation are reassuring in the Emergency Department today.  I am most suspicious of a virus.  Return to the ER for any worsening condition including trouble breathing, trouble swallowing, fever greater than 3 days, concern for dehydration, dizziness or passing out or any other symptoms concerning to you.

## 2018-05-10 NOTE — ED Provider Notes (Signed)
[facility] Emergency Department Provider Note ____________________________________________   I have reviewed the triage vital signs and the triage nursing note.  HISTORY  Chief Complaint Sore Throat   Historian Patient  HPI Teresa Meza is a 37 y.o. female 2 months postpartum presenting with cough and nasal congestion and reports subjective fever at home.  Sore throat since yesterday.  Mild sputum production.  Symptoms are mild to moderate.  She came for evaluation in tandem with HER-32-monthold son who is currently on amoxicillin for a few days to "prevent pneumonia "because the 430year-old sibling was diagnosed with "a touch of pneumonia "and placed on amoxicillin.  Symptoms are mild.  Denies wheezing.  Denies chest pain or any pleuritic chest pain.   Has been breast-feeding as well as formula feeding.  She is currently doing a some breast pumping.    Past Medical History:  Diagnosis Date  . Ectopic fetus   . Elderly multigravida 08/26/2017   [ ]  NIPT [ ]  NST/AFI weekly starting at 36 weeks  . Elevated blood pressure reading without diagnosis of hypertension 02/10/2018    Patient Active Problem List   Diagnosis Date Noted  . Encounter for induction of labor 02/14/2018  . Abdominal wall cellulitis 02/11/2018  . Abdominal pain 02/10/2018  . Anemia 02/10/2018  . Preeclampsia, third trimester 02/10/2018  . Indication for care in labor and delivery, antepartum 01/24/2018  . Pregnancy 01/24/2018  . Cough 11/07/2017  . Labor and delivery indication for care or intervention 10/24/2017  . Advanced maternal age in multigravida, unspecified trimester 10/23/2017  . Gestational diabetes mellitus (GDM) affecting fourth pregnancy 10/14/2017  . BMI 37.0-37.9, adult 10/08/2017  . Obesity in pregnancy 10/08/2017  . Supervision of high risk pregnancy, antepartum 08/26/2017  . Hx of gestational diabetes in prior pregnancy, currently pregnant 08/26/2017     Past Surgical History:  Procedure Laterality Date  . ECTOPIC PREGNANCY SURGERY      Prior to Admission medications   Medication Sig Start Date End Date Taking? Authorizing Provider  Prenatal Vit-Fe Fumarate-FA (MULTIVITAMIN-PRENATAL) 27-0.8 MG TABS tablet Take 1 tablet by mouth daily at 12 noon.    [provider]    Allergies  Allergen Reactions  . Sulfa Antibiotics Hives, Itching and Nausea Only    Family History  Problem Relation Age of Onset  . Diabetes Mellitus II Mother     Social History Social History   Tobacco Use  . Smoking status: Never Smoker  . Smokeless tobacco: Never Used  Substance Use Topics  . Alcohol use: No  . Drug use: No    Review of Systems  Constitutional: Positive for fever. Eyes: Negative for visual changes. ENT: Negative for sore throat. Cardiovascular: Negative for chest pain. Respiratory: Positive for coughing. Gastrointestinal: Negative for abdominal pain, vomiting and diarrhea. Genitourinary: Negative for dysuria. Musculoskeletal: Negative for back pain. Skin: Negative for rash. Neurological: Negative for headache.  ____________________________________________   PHYSICAL EXAM:  VITAL SIGNS: ED Triage Vitals  Enc Vitals Group     BP 05/10/18 0939 (!) 129/92     Pulse Rate 05/10/18 0939 92     Resp 05/10/18 0939 16     Temp 05/10/18 0939 98.6 F (37 C)     Temp Source 05/10/18 0939 Oral     SpO2 05/10/18 0939 96 %     Weight 05/10/18 0943 199 lb (90.3 kg)     Height 05/10/18 0943 5' 3"  (1.6 m)     Head Circumference --  Peak Flow --      Pain Score 05/10/18 0942 6     Pain Loc --      Pain Edu? --      Excl. in [place]? --      Constitutional: Alert and oriented.  HEENT      Head: Normocephalic and atraumatic.      Eyes: Conjunctivae are normal. Pupils equal and round.       Ears:         Nose: No congestion/rhinnorhea.      Mouth/Throat: Mucous membranes are moist.      Neck: No  stridor. Cardiovascular/Chest: Normal rate, regular rhythm.  No murmurs, rubs, or gallops. Respiratory: Normal respiratory effort without tachypnea nor retractions. Breath sounds are clear and equal bilaterally.  Moderate cough.  No wheezing or rhonchi appreciated. Gastrointestinal: Soft. No distention, no guarding, no rebound. Nontender.    Genitourinary/rectal:Deferred Musculoskeletal: Nontender with normal range of motion in all extremities. No joint effusions.  No lower extremity tenderness.  No edema. Neurologic:  Normal speech and language. No gross or focal neurologic deficits are appreciated. Skin:  Skin is warm, dry and intact. No rash noted. Psychiatric: Mood and affect are normal. Speech and behavior are normal. Patient exhibits appropriate insight and judgment.   ____________________________________________  LABS (pertinent positives/negatives) I, Lisa Roca, MD the attending physician have reviewed the labs noted below.  Labs Reviewed  GROUP A STREP BY PCR    ____________________________________________    EKG I, Lisa Roca, MD, the attending physician have personally viewed and interpreted all ECGs.  None ____________________________________________  RADIOLOGY   Chest x-ray two-view, viewed by me and interpreted by radiologist: No active cardiopulmonary disease __________________________________________  PROCEDURES  Procedure(s) performed: None  Procedures  Critical Care performed: None   ____________________________________________  ED COURSE / ASSESSMENT AND PLAN  Pertinent labs & imaging results that were available during my care of the patient were reviewed by me and considered in my medical decision making (see chart for details).    She is overall well-appearing with no fever here.  Reports fever yesterday.  Her main complaint this point is congestion and sore throat but she is also had a cough.  Does not really sound to bronchospastic,  cough seems probably more related to postnasal drainage at the throat.  We will go ahead and x-rayed to ensure no pneumonia given the fever.  I am going to swab for strep throat given the fever as well.   CXR clear.  Step neg.  Discussed suspected viral uri.  OK for discharge.   CONSULTATIONS:  None  Patient / Family / Caregiver informed of clinical course, medical decision-making process, and agree with plan.   I discussed return precautions, follow-up instructions, and discharge instructions with patient and/or family.  Discharge Instructions : You were evaluated for congestion, sore throat, fever, and cough and your exam and evaluation are reassuring in the Emergency Department today.  I am most suspicious of a virus.  Return to the ER for any worsening condition including trouble breathing, trouble swallowing, fever greater than 3 days, concern for dehydration, dizziness or passing out or any other symptoms concerning to you.    ___________________________________________   FINAL CLINICAL IMPRESSION(S) / ED DIAGNOSES   Final diagnoses:  Viral pharyngitis  Viral URI with cough      ___________________________________________         Note: This dictation was prepared with Dragon dictation. Any transcriptional errors that result from this process are unintentional  Lisa Roca, MD 05/10/18 1136

## 2018-05-10 NOTE — ED Triage Notes (Signed)
States sore throat since yesterday. Thick mucus. A&O x 4. Ambulatory. No distress noted. States fever yesterday, took ibuprofen. Speaking in complete sentences.

## 2018-05-10 NOTE — ED Notes (Signed)
Patient transported to X-ray 

## 2018-05-23 ENCOUNTER — Encounter: Payer: Self-pay | Admitting: Emergency Medicine

## 2018-05-23 DIAGNOSIS — R509 Fever, unspecified: Secondary | ICD-10-CM | POA: Insufficient documentation

## 2018-05-23 DIAGNOSIS — J029 Acute pharyngitis, unspecified: Secondary | ICD-10-CM | POA: Insufficient documentation

## 2018-05-23 DIAGNOSIS — Z5321 Procedure and treatment not carried out due to patient leaving prior to being seen by health care provider: Secondary | ICD-10-CM | POA: Insufficient documentation

## 2018-05-23 DIAGNOSIS — R05 Cough: Secondary | ICD-10-CM | POA: Insufficient documentation

## 2018-05-23 NOTE — ED Triage Notes (Signed)
Patient report fever, sore throat and coughing up phlegm times two weeks. Patient states that she has not checked her temperature but that she felt feverish.

## 2018-05-24 ENCOUNTER — Emergency Department
Admission: EM | Admit: 2018-05-24 | Discharge: 2018-05-24 | Disposition: A | Discharge status: Home / Self Care | Payer: Self-pay | Attending: Emergency Medicine | Admitting: Emergency Medicine

## 2018-05-24 LAB — GROUP A STREP BY PCR: Group A Strep by PCR: NOT DETECTED

## 2018-05-24 NOTE — ED Notes (Signed)
Pt's husband up to desk to ask about wait time

## 2019-03-22 ENCOUNTER — Emergency Department: Payer: Medicaid Other

## 2019-03-22 ENCOUNTER — Other Ambulatory Visit: Payer: Self-pay

## 2019-03-22 ENCOUNTER — Emergency Department
Admission: EM | Admit: 2019-03-22 | Discharge: 2019-03-22 | Disposition: A | Discharge status: Home / Self Care | Payer: Medicaid Other | Attending: Emergency Medicine | Admitting: Emergency Medicine

## 2019-03-22 DIAGNOSIS — N83202 Unspecified ovarian cyst, left side: Secondary | ICD-10-CM | POA: Insufficient documentation

## 2019-03-22 DIAGNOSIS — R1030 Lower abdominal pain, unspecified: Secondary | ICD-10-CM

## 2019-03-22 DIAGNOSIS — R52 Pain, unspecified: Secondary | ICD-10-CM

## 2019-03-22 DIAGNOSIS — M545 Low back pain: Secondary | ICD-10-CM | POA: Diagnosis present

## 2019-03-22 LAB — CBC WITH DIFFERENTIAL/PLATELET
Abs Immature Granulocytes: 0.03 10*3/uL (ref 0.00–0.07)
Basophils Absolute: 0 10*3/uL (ref 0.0–0.1)
Basophils Relative: 1 %
Eosinophils Absolute: 0.2 10*3/uL (ref 0.0–0.5)
Eosinophils Relative: 3 %
HCT: 42.2 % (ref 36.0–46.0)
Hemoglobin: 13.9 g/dL (ref 12.0–15.0)
Immature Granulocytes: 1 %
Lymphocytes Relative: 31 %
Lymphs Abs: 1.8 10*3/uL (ref 0.7–4.0)
MCH: 27.6 pg (ref 26.0–34.0)
MCHC: 32.9 g/dL (ref 30.0–36.0)
MCV: 83.7 fL (ref 80.0–100.0)
Monocytes Absolute: 0.4 10*3/uL (ref 0.1–1.0)
Monocytes Relative: 7 %
Neutro Abs: 3.5 10*3/uL (ref 1.7–7.7)
Neutrophils Relative %: 57 %
Platelets: 265 10*3/uL (ref 150–400)
RBC: 5.04 MIL/uL (ref 3.87–5.11)
RDW: 13.1 % (ref 11.5–15.5)
WBC: 5.9 10*3/uL (ref 4.0–10.5)
nRBC: 0 % (ref 0.0–0.2)

## 2019-03-22 LAB — URINALYSIS, COMPLETE (UACMP) WITH MICROSCOPIC
Bilirubin Urine: NEGATIVE
Glucose, UA: NEGATIVE mg/dL
Ketones, ur: NEGATIVE mg/dL
Leukocytes,Ua: NEGATIVE
Nitrite: NEGATIVE
Protein, ur: 30 mg/dL — AB
Specific Gravity, Urine: 1.024 (ref 1.005–1.030)
pH: 5 (ref 5.0–8.0)

## 2019-03-22 LAB — BASIC METABOLIC PANEL
Anion gap: 6 (ref 5–15)
BUN: 11 mg/dL (ref 6–20)
CO2: 27 mmol/L (ref 22–32)
Calcium: 9 mg/dL (ref 8.9–10.3)
Chloride: 104 mmol/L (ref 98–111)
Creatinine, Ser: 1.01 mg/dL — ABNORMAL HIGH (ref 0.44–1.00)
GFR calc Af Amer: >60 mL/min (ref >60)
GFR calc non Af Amer: >60 mL/min (ref >60)
Glucose, Bld: 138 mg/dL — ABNORMAL HIGH (ref 70–99)
Potassium: 3.7 mmol/L (ref 3.5–5.1)
Sodium: 137 mmol/L (ref 135–145)

## 2019-03-22 LAB — HCG, QUANTITATIVE, PREGNANCY: hCG, Beta Chain, Quant, S: <1 m[IU]/mL (ref <5)

## 2019-03-22 LAB — POCT PREGNANCY, URINE: Preg Test, Ur: NEGATIVE

## 2019-03-22 IMAGING — US US PELVIS COMPLETE WITH TRANSVAGINAL
2 series · 13 of 25 positions shown · non-contrast
Comparison: None

CLINICAL DATA: Acute left lower quadrant abdominal pain.

EXAM:
TRANSABDOMINAL AND TRANSVAGINAL ULTRASOUND OF PELVIS
TECHNIQUE: Both transabdominal and transvaginal ultrasound examinations of the
pelvis were performed. Transabdominal technique was performed for
global imaging of the pelvis including uterus, ovaries, adnexal
regions, and pelvic cul-de-sac. It was necessary to proceed with
endovaginal exam following the transabdominal exam to visualize the
endometrium and ovaries.

[Series 1: us pelvis complete with transvaginal · 7 of 51 slices shown (1 of 2)]
[im 1/51]
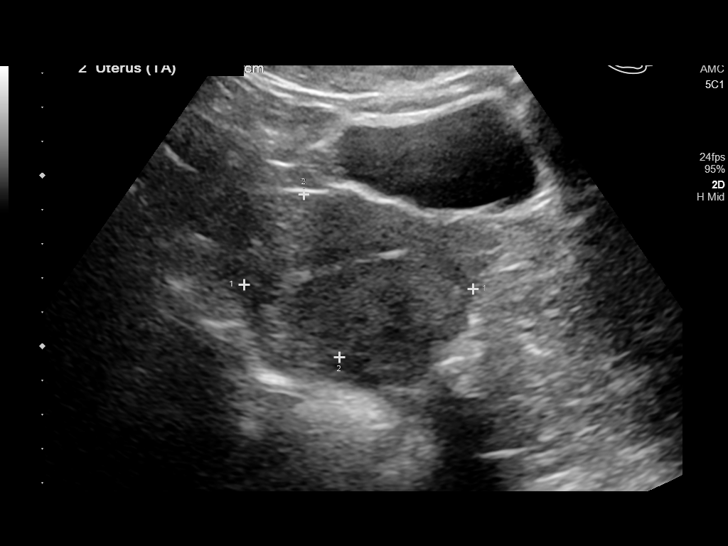
[im 9/51]
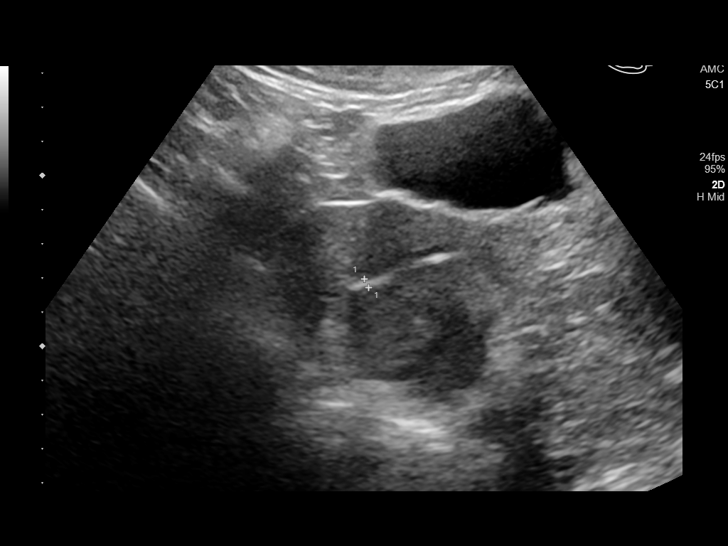
[im 17/51]
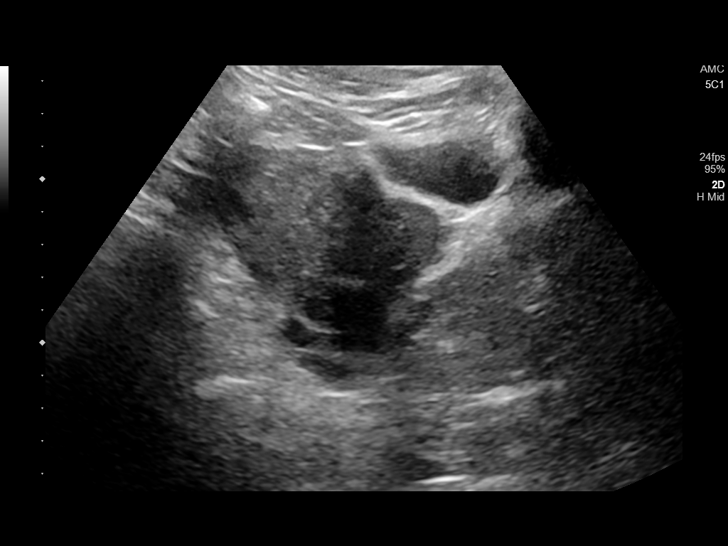
[im 26/51]
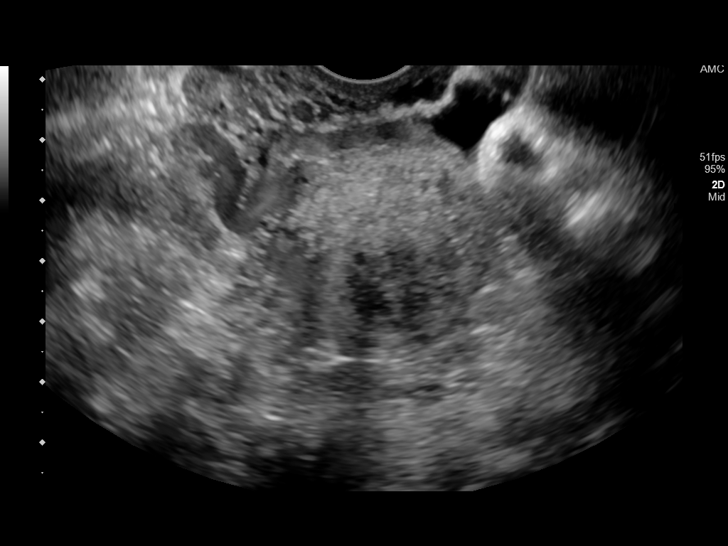
[im 34/51]
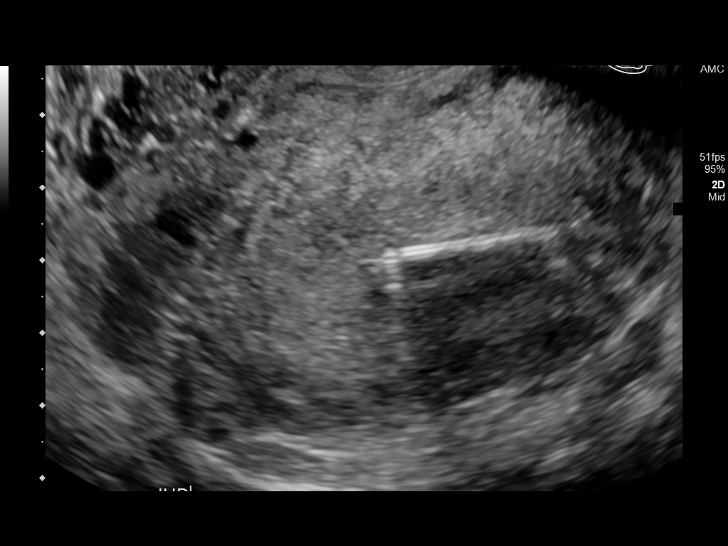
[im 42/51]
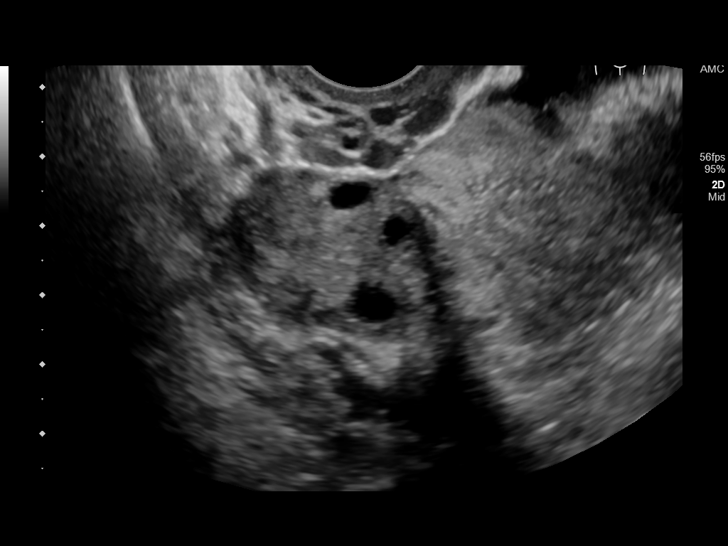
[im 51/51]
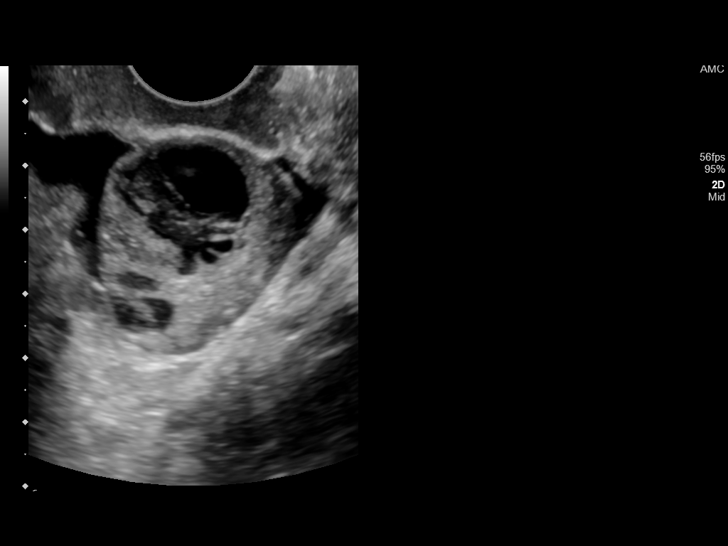

[Series 1: us pelvis complete with transvaginal · 6 of 48 slices shown (2 of 2)]
[im 5/48]
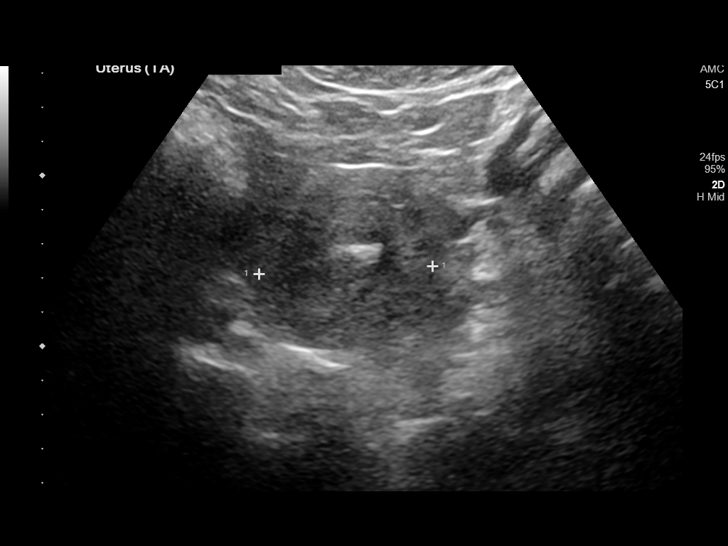
[im 13/48]
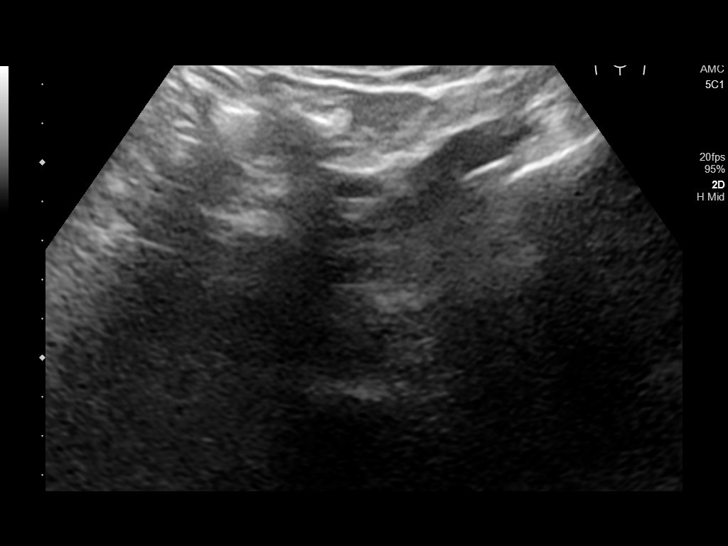
[im 22/48]
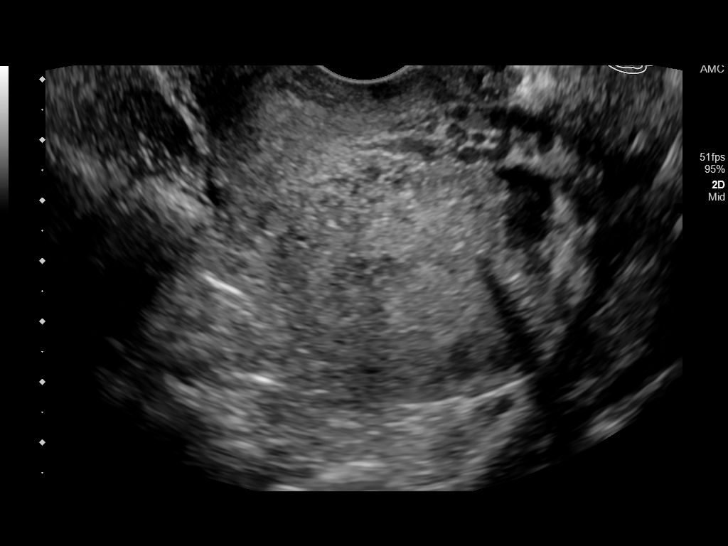
[im 30/48]
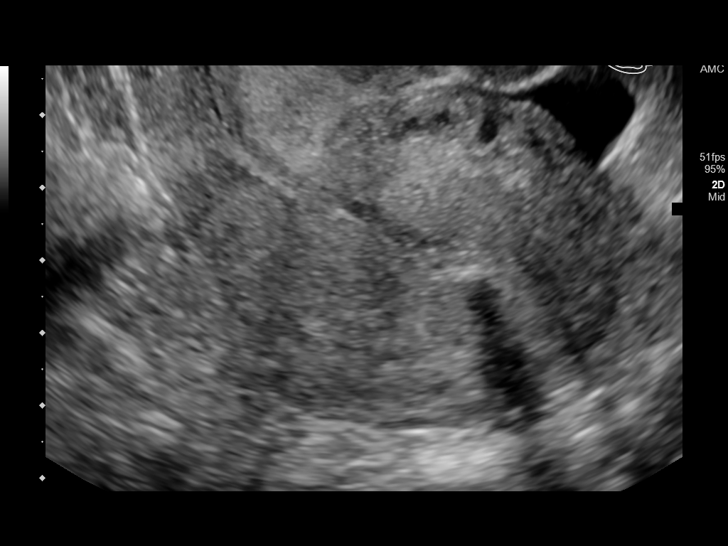
[im 39/48]
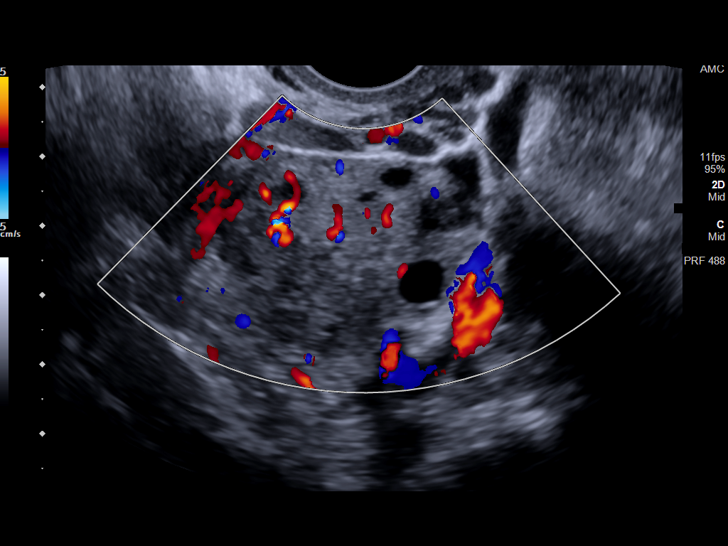
[im 48/48]
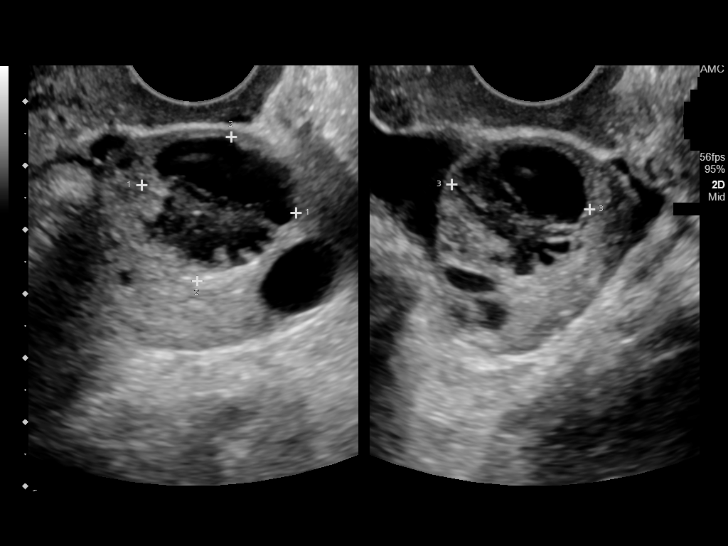

[13 of 25 positions shown; findings below may reference images not displayed]

FINDINGS: Uterus

Measurements: 7.4 x 5.5 x 4.5 cm = volume: 96 mL. No fibroids or
other mass visualized.

Endometrium

Thickness: 2 mm which is within normal limits. Intrauterine device
is noted in endometrial space.

Right ovary

Measurements: 3.8 x 3.0 x 2.7 cm = volume: 16 mL. Normal
appearance/no adnexal mass.

Left ovary

Measurements: 5.3 x 3.3 x 3.0 cm = volume: 27 mL. 2.4 cm complex
cyst is noted.

Other findings

Small amount of free fluid is noted which most likely is
physiologic.
IMPRESSION: Intrauterine device is noted. 2.4 cm complex left ovarian cyst is
noted most consistent with hemorrhagic cyst. Short-interval follow
up ultrasound in 6-12 weeks is recommended, preferably during the
week following the patient's normal menses.

## 2019-03-22 IMAGING — CT CT RENAL STONE PROTOCOL
2 of 4 series · 16 of 46 positions shown, 18 images · non-contrast
Comparison: CT abdomen pelvis [DATE]

CLINICAL DATA: Presents with left leg pain for several days Now
having lower back pain and pressure to lower abd Ambulates with
slight limp States she has a hx of ectopic pregnancy

EXAM:
CT ABDOMEN AND PELVIS WITHOUT CONTRAST
TECHNIQUE: Multidetector CT imaging of the abdomen and pelvis was performed
following the standard protocol without IV contrast.

[Series 2: stone full standard · axial · 0.70mm/px · z∈[-519,-49]mm · 13 of 104 slices shown, 15 images]
[im 5/104  soft-tissue]
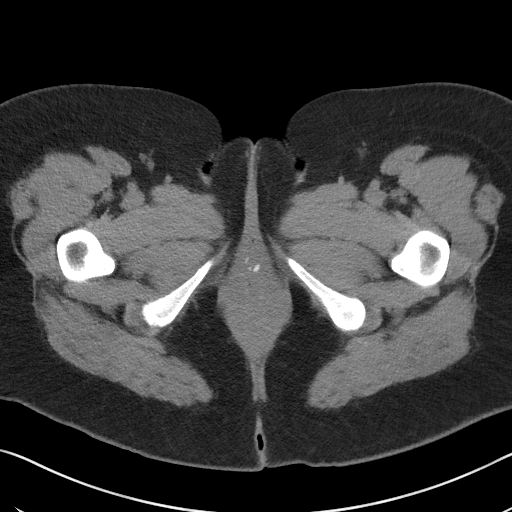
[im 5/104  bone]
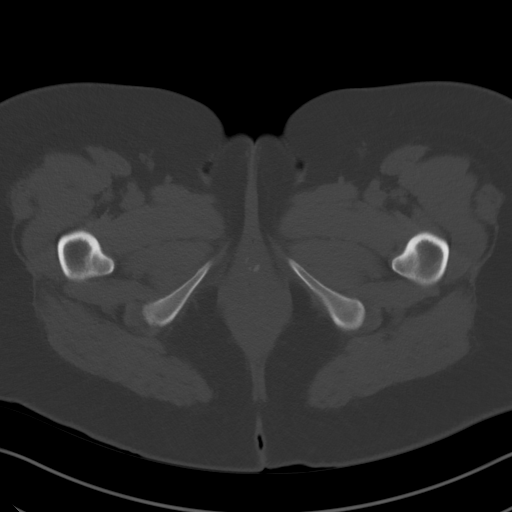
[im 13/104  soft-tissue]
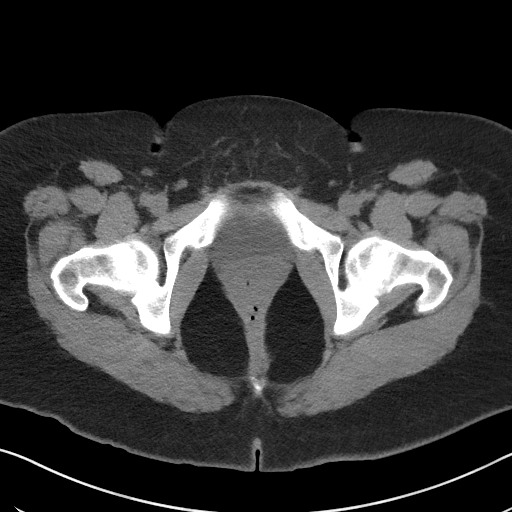
[im 22/104  soft-tissue]
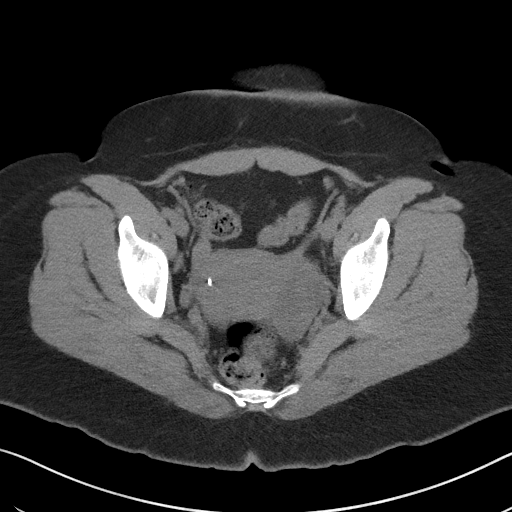
[im 31/104  soft-tissue]
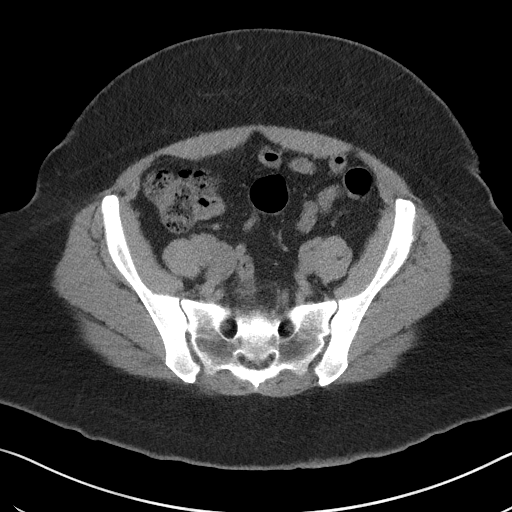
[im 35/104  soft-tissue]
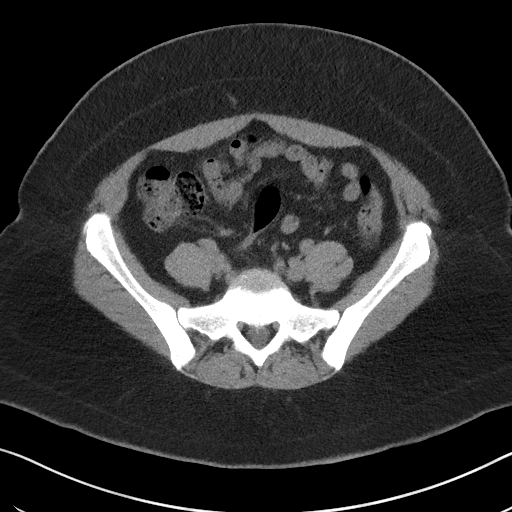
[im 43/104  soft-tissue]
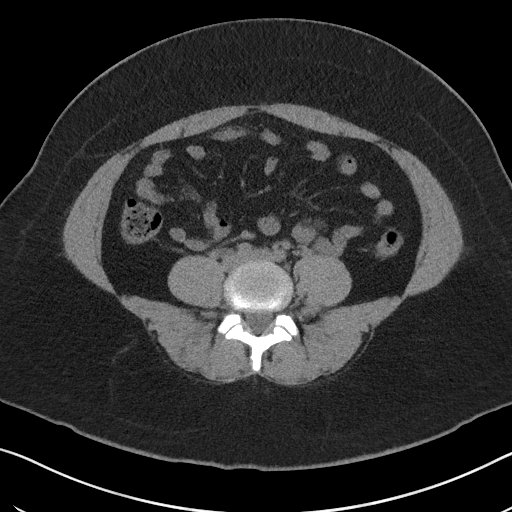
[im 52/104  soft-tissue]
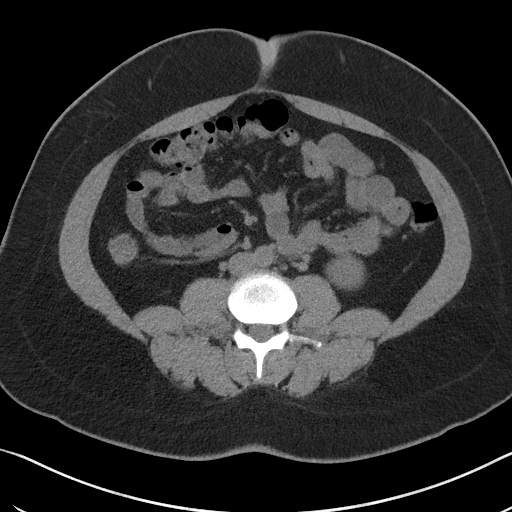
[im 61/104  soft-tissue]
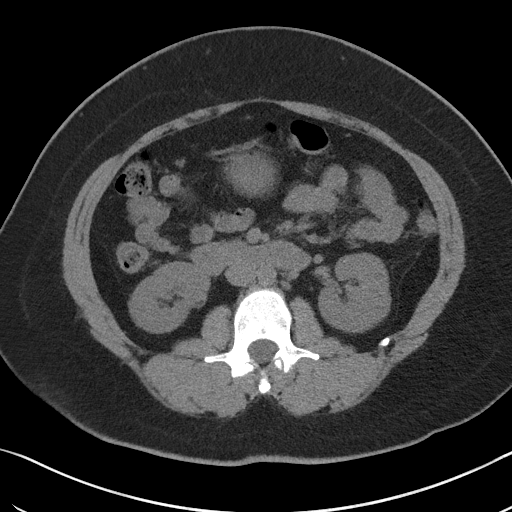
[im 69/104  soft-tissue]
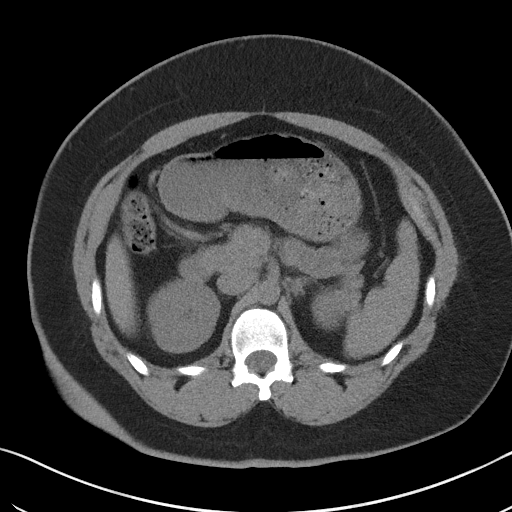
[im 69/104  bone]
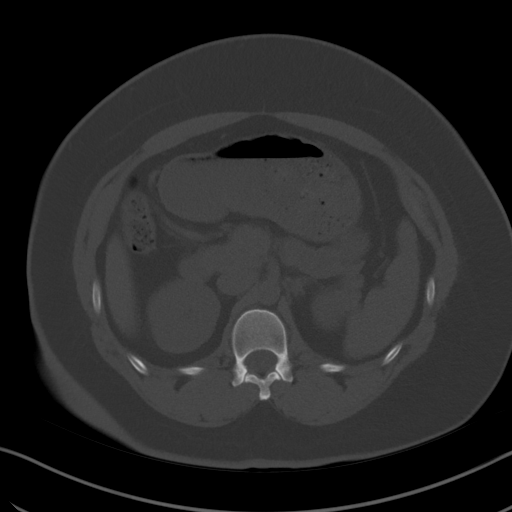
[im 73/104  soft-tissue]
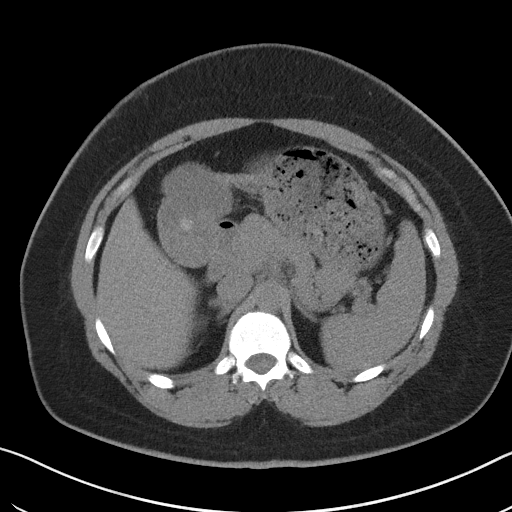
[im 82/104  soft-tissue]
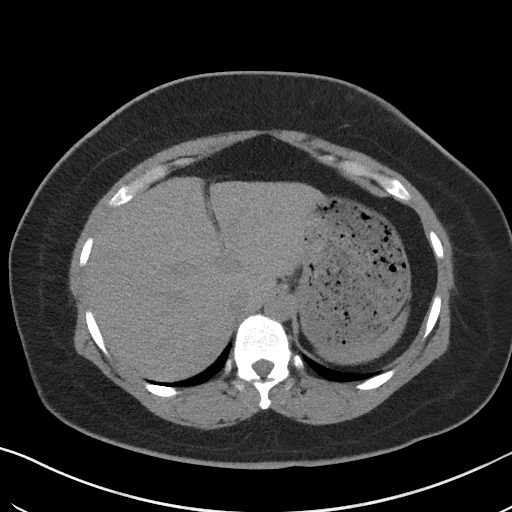
[im 91/104  soft-tissue]
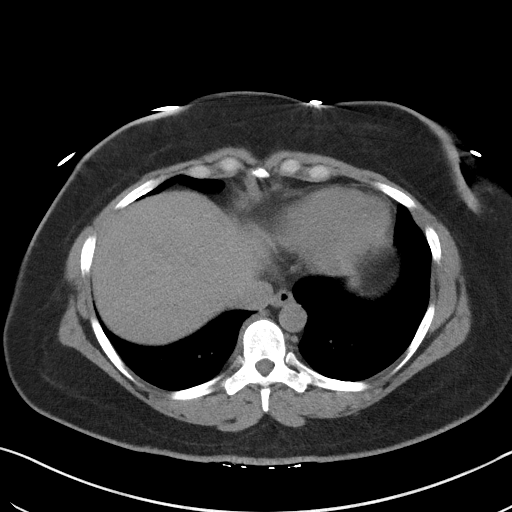
[im 99/104  soft-tissue]
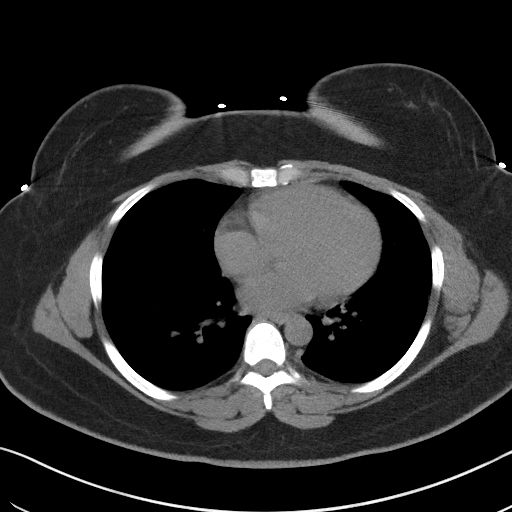

[Series 5: coronal · coronal · 0.77mm/px · 3 of 160 slices shown]
[im 54/160  soft-tissue]
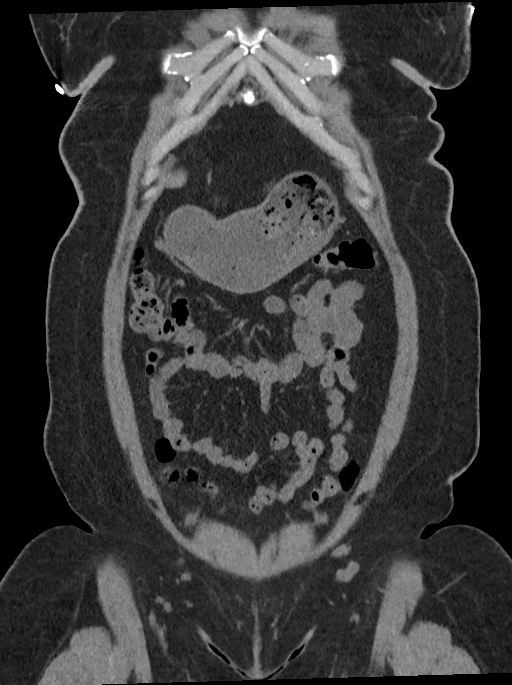
[im 71/160  soft-tissue]
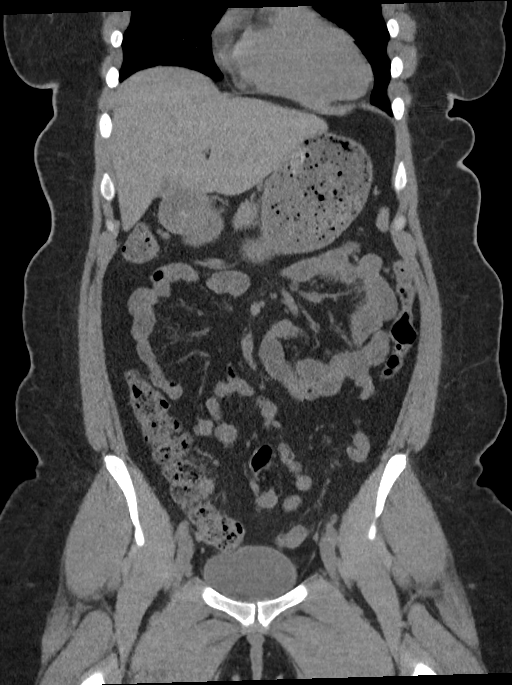
[im 89/160  soft-tissue]
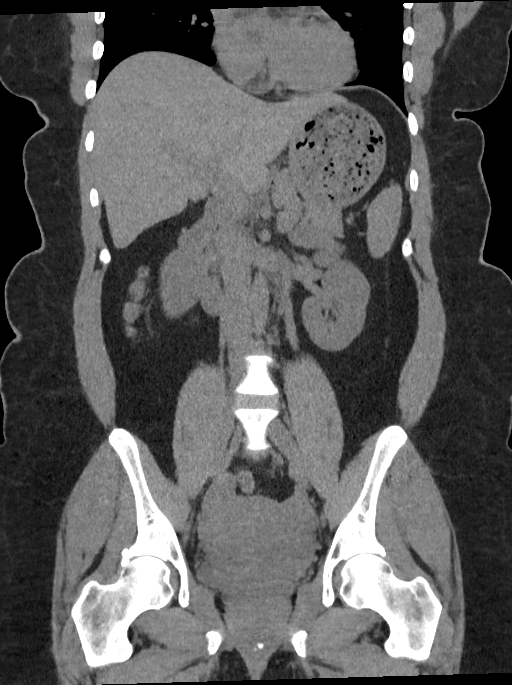

[16 of 46 positions shown; findings below may reference images not displayed]

FINDINGS: Lower chest: No acute abnormality.

Evaluation of the abdominal organs is somewhat limited by lack of
contrast.

Hepatobiliary: No focal liver abnormality is seen. There are a few
tiny gallstones. The gallbladder is decompressed. No gallbladder
wall thickening, or biliary dilatation.

Pancreas: Unremarkable. No surrounding inflammatory changes.

Spleen: Normal in size without focal abnormality.

Adrenals/Urinary Tract: Adrenal glands are unremarkable. Kidneys are
normal, without renal calculi or hydronephrosis. Bladder is
unremarkable.

Stomach/Bowel: Stomach is within normal limits. Appendix appears
normal. No evidence of bowel wall thickening, distention, or
inflammatory changes.

Vascular/Lymphatic: No significant vascular findings are present. No
enlarged abdominal or pelvic lymph nodes.

Reproductive: IUD in place. There is asymmetric fullness in the
region of the left adnexa, some of which demonstrates fluid
attenuation, incompletely evaluated in the absence of IV contrast.

Other: No abdominal wall hernia or abnormality. No abdominopelvic
ascites.

Musculoskeletal: No acute or significant osseous findings.
IMPRESSION: 1.  No evidence of renal calculi.

2. Asymmetric fullness in the region of the left adnexa, some of
which demonstrates fluid attenuation, incompletely evaluated in the
absence of IV contrast. Recommend further evaluation with pelvic
ultrasound.

3.  Cholelithiasis without evidence of cholecystitis.

## 2019-03-22 MED ORDER — HYDROCODONE-ACETAMINOPHEN 5-325 MG PO TABS: 1.0000 | ORAL_TABLET | Freq: Four times a day (QID) | ORAL | 0 refills | Status: DC | PRN

## 2019-03-22 MED ORDER — NAPROXEN SODIUM 550 MG PO TABS: 550.0000 mg | ORAL_TABLET | Freq: Two times a day (BID) | ORAL | 0 refills | Status: DC

## 2019-03-22 NOTE — ED Notes (Addendum)
See triage note  Presents with left leg pain for several days   Now having lower back pain and pressure to lower abd  Ambulates with slight limp   States she has a hx of ectopic pregnancy  States abs feels the same

## 2019-03-22 NOTE — ED Triage Notes (Signed)
Pt c/o left lower back pain that radiates into the leg for the past 3-4 days.

## 2019-03-22 NOTE — ED Provider Notes (Signed)
[facility] Emergency Department Provider Note  ____________________________________________   First MD Initiated Contact with Patient 03/22/19 [phone]     (approximate)  I have reviewed the triage vital signs and the nursing notes.   HISTORY  Chief Complaint Back Pain   HPI Teresa Meza is a 38 y.o. female presents to the ED with complaint of left lower back pain that radiates into her left leg for the past 3 to 4 days along with abdominal discomfort.  She denies any nausea, vomiting or hematuria.  Patient has continued to ambulate without any assistance.  She denies previous stones.  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  She denies any injury to her back.  She rates her pain as a 7 out of 10.         Past Medical History:  Diagnosis Date  . Ectopic fetus   . Elderly multigravida 08/26/2017   [ ]  NIPT [ ]  NST/AFI weekly starting at 36 weeks  . Elevated blood pressure reading without diagnosis of hypertension 02/10/2018    Patient Active Problem List   Diagnosis Date Noted  . Encounter for induction of labor 02/14/2018  . Abdominal wall cellulitis 02/11/2018  . Abdominal pain 02/10/2018  . Anemia 02/10/2018  . Preeclampsia, third trimester 02/10/2018  . Indication for care in labor and delivery, antepartum 01/24/2018  . Pregnancy 01/24/2018  . Cough 11/07/2017  . Labor and delivery indication for care or intervention 10/24/2017  . Advanced maternal age in multigravida, unspecified trimester 10/23/2017  . Gestational diabetes mellitus (GDM) affecting fourth pregnancy 10/14/2017  . BMI 37.0-37.9, adult 10/08/2017  . Obesity in pregnancy 10/08/2017  . Supervision of high risk pregnancy, antepartum 08/26/2017  . Hx of gestational diabetes in prior pregnancy, currently pregnant 08/26/2017    Past Surgical History:  Procedure Laterality Date  . ECTOPIC PREGNANCY SURGERY      Prior  to Admission medications   Medication Sig Start Date End Date Taking? Authorizing Provider  HYDROcodone-acetaminophen (NORCO/VICODIN) 5-325 MG tablet Take 1 tablet by mouth every 6 (six) hours as needed for moderate pain. 03/22/19   Tommi Rumps, PA-C  naproxen sodium (ANAPROX DS) 550 MG tablet Take 1 tablet (550 mg total) by mouth 2 (two) times daily with a meal. 03/22/19   Tommi Rumps, PA-C    Allergies Sulfa antibiotics  Family History  Problem Relation Age of Onset  . Diabetes Mellitus II Mother     Social History Social History   Tobacco Use  . Smoking status: Never Smoker  . Smokeless tobacco: Never Used  Substance Use Topics  . Alcohol use: No  . Drug use: No    Review of Systems Constitutional: No fever/chills Cardiovascular: Denies chest pain. Respiratory: Denies shortness of breath. Gastrointestinal: Positive "abdominal fullness".  No nausea, no vomiting.  No diarrhea.  No constipation. Genitourinary: Negative for dysuria.  Negative for gross hematuria. Musculoskeletal: Positive for left lower back pain. Skin: Negative for rash. Neurological: Negative for headaches, focal weakness or numbness. ____________________________________________   PHYSICAL EXAM:  VITAL SIGNS: ED Triage Vitals  Enc Vitals Group     BP 03/22/19 0915 (!) 111/96     Pulse Rate 03/22/19 0915 99     Resp 03/22/19 0915 18     Temp 03/22/19 0915 98.9 F (37.2 C)     Temp Source 03/22/19 0915 Oral     SpO2 03/22/19 0915 99 %  Weight 03/22/19 0916 200 lb (90.7 kg)     Height 03/22/19 0916 5\' 3"  (1.6 m)     Head Circumference --      Peak Flow --      Pain Score 03/22/19 0916 7     Pain Loc --      Pain Edu? --      Excl. in [place]? --    Constitutional: Alert and oriented. Well appearing and in no acute distress. Eyes: Conjunctivae are normal.  Head: Atraumatic. Neck: No stridor.   Cardiovascular: Normal rate, regular rhythm. Grossly normal heart sounds.  Good peripheral  circulation. Respiratory: Normal respiratory effort.  No retractions. Lungs CTAB. Gastrointestinal: Soft, diffuse tenderness pelvic area no distention.  Moderately obese.  Bowel sounds normoactive x4 quadrants. Musculoskeletal: On examination of the back there is no gross deformity noted.  Range of motion is slow and guarded secondary to discomfort.  No active muscle spasms were seen.  Nontender to palpation thoracic and lumbar spine.  Good muscle strength bilaterally.  Normal gait noted. Neurologic:  Normal speech and language. No gross focal neurologic deficits are appreciated.  Skin:  Skin is warm, dry and intact. No rash noted. Psychiatric: Mood and affect are normal. Speech and behavior are normal.  ____________________________________________   LABS (all labs ordered are listed, but only abnormal results are displayed)  Labs Reviewed  URINALYSIS, COMPLETE (UACMP) WITH MICROSCOPIC - Abnormal; Notable for the following components:      Result Value   Color, Urine YELLOW (*)    APPearance HAZY (*)    Hgb urine dipstick MODERATE (*)    Protein, ur 30 (*)    Bacteria, UA RARE (*)    All other components within normal limits  BASIC METABOLIC PANEL - Abnormal; Notable for the following components:   Glucose, Bld 138 (*)    Creatinine, Ser 1.01 (*)    All other components within normal limits  CBC WITH DIFFERENTIAL/PLATELET  HCG, QUANTITATIVE, PREGNANCY  POC URINE PREG, ED  POCT PREGNANCY, URINE    RADIOLOGY  Official radiology report(s): Ct Renal Stone Study  Result Date: 03/22/2019 CLINICAL DATA:  Presents with left leg pain for several days Now having lower back pain and pressure to lower abd Ambulates with slight limp States she has a hx of ectopic pregnancy EXAM: CT ABDOMEN AND PELVIS WITHOUT CONTRAST TECHNIQUE: Multidetector CT imaging of the abdomen and pelvis was performed following the standard protocol without IV contrast. COMPARISON:  CT abdomen pelvis 01/29/2016  FINDINGS: Lower chest: No acute abnormality. Evaluation of the abdominal organs is somewhat limited by lack of contrast. Hepatobiliary: No focal liver abnormality is seen. There are a few tiny gallstones. The gallbladder is decompressed. No gallbladder wall thickening, or biliary dilatation. Pancreas: Unremarkable. No surrounding inflammatory changes. Spleen: Normal in size without focal abnormality. Adrenals/Urinary Tract: Adrenal glands are unremarkable. Kidneys are normal, without renal calculi or hydronephrosis. Bladder is unremarkable. Stomach/Bowel: Stomach is within normal limits. Appendix appears normal. No evidence of bowel wall thickening, distention, or inflammatory changes. Vascular/Lymphatic: No significant vascular findings are present. No enlarged abdominal or pelvic lymph nodes. Reproductive: IUD in place. There is asymmetric fullness in the region of the left adnexa, some of which demonstrates fluid attenuation, incompletely evaluated in the absence of IV contrast. Other: No abdominal wall hernia or abnormality. No abdominopelvic ascites. Musculoskeletal: No acute or significant osseous findings. IMPRESSION: 1.  No evidence of renal calculi. 2. Asymmetric fullness in the region of the left adnexa, some of  which demonstrates fluid attenuation, incompletely evaluated in the absence of IV contrast. Recommend further evaluation with pelvic ultrasound. 3.  Cholelithiasis without evidence of cholecystitis. Electronically Signed   By: Emmaline KluverNancy  Ballantyne M.D.   On: 03/22/2019 12:44   [place] Pelvic Complete With Transvaginal  Result Date: 03/22/2019 CLINICAL DATA:  Acute left lower quadrant abdominal pain. EXAM: TRANSABDOMINAL AND TRANSVAGINAL ULTRASOUND OF PELVIS TECHNIQUE: Both transabdominal and transvaginal ultrasound examinations of the pelvis were performed. Transabdominal technique was performed for global imaging of the pelvis including uterus, ovaries, adnexal regions, and pelvic cul-de-sac. It was  necessary to proceed with endovaginal exam following the transabdominal exam to visualize the endometrium and ovaries. COMPARISON:  None FINDINGS: Uterus Measurements: 7.4 x 5.5 x 4.5 cm = volume: 96 mL. No fibroids or other mass visualized. Endometrium Thickness: 2 mm which is within normal limits. Intrauterine device is noted in endometrial space. Right ovary Measurements: 3.8 x 3.0 x 2.7 cm = volume: 16 mL. Normal appearance/no adnexal mass. Left ovary Measurements: 5.3 x 3.3 x 3.0 cm = volume: 27 mL. 2.4 cm complex cyst is noted. Other findings Small amount of free fluid is noted which most likely is physiologic. IMPRESSION: Intrauterine device is noted. 2.4 cm complex left ovarian cyst is noted most consistent with hemorrhagic cyst. Short-interval follow up ultrasound in 6-12 weeks is recommended, preferably during the week following the patient's normal menses. Electronically Signed   By: Lupita RaiderJames  Green Jr M.D.   On: 03/22/2019 13:58    ____________________________________________   PROCEDURES  Procedure(s) performed (including Critical Care):  Procedures   ____________________________________________   INITIAL IMPRESSION / ASSESSMENT AND PLAN / ED COURSE  As part of my medical decision making, I reviewed the following data within the electronic MEDICAL RECORD NUMBER Notes from prior ED visits and [place] Controlled Substance Database  38 year old female presents to the ED with complaint of low back pain with pressure in her lower abdomen.  Patient expresses that she is frightened as she has had history of ectopic pregnancy in the past and that this feels the same.  She denies any fever, chills, nausea or vomiting.  Patient has an IUD.  Urinalysis showed RBCs and CT scan ruled out renal stones.  There was however a fullness noted in the left lower quadrant that could not be visualized well.  Ultrasound did show that this was a left ovarian cyst.  Patient was made aware.  She will follow-up with her  OB/GYN for reevaluation.  Patient was discharged with a prescription for naproxen DS and Norco every 6 hours if needed for pain.  ____________________________________________   FINAL CLINICAL IMPRESSION(S) / ED DIAGNOSES  Final diagnoses:  Left ovarian cyst  Lower abdominal pain     ED Discharge Orders         Ordered    naproxen sodium (ANAPROX DS) 550 MG tablet  2 times daily with meals     03/22/19 1423    HYDROcodone-acetaminophen (NORCO/VICODIN) 5-325 MG tablet  Every 6 hours PRN     03/22/19 1423           Note:  This document was prepared using Dragon voice recognition software and may include unintentional dictation errors.    Tommi RumpsSummers, Daisey Caloca L, PA-C 03/22/19 1507    Shaune PollackIsaacs, [place], MD 03/22/19 [phone]

## 2019-03-22 NOTE — Discharge Instructions (Addendum)
Follow-up with Dr. Leonides Schanz in approximately 6 to 12 weeks for follow-up on your ovarian cyst.  A repeat ultrasound is recommended at that time and preferably 1 week after your period.  Anaprox DS is twice daily with food.  Hydrocodone is to be taken every 6 hours if needed for moderate to severe pain.  Do not drive or operate machinery while taking the hydrocodone as it could cause drowsiness and increase your risk for injury.

## 2019-05-10 ENCOUNTER — Encounter: Payer: Self-pay | Admitting: Emergency Medicine

## 2019-05-10 ENCOUNTER — Other Ambulatory Visit: Payer: Self-pay

## 2019-05-10 ENCOUNTER — Emergency Department
Admission: EM | Admit: 2019-05-10 | Discharge: 2019-05-10 | Disposition: A | Discharge status: Home / Self Care | Payer: Medicaid Other | Attending: Emergency Medicine | Admitting: Emergency Medicine

## 2019-05-10 DIAGNOSIS — Z791 Long term (current) use of non-steroidal anti-inflammatories (NSAID): Secondary | ICD-10-CM | POA: Insufficient documentation

## 2019-05-10 DIAGNOSIS — G44209 Tension-type headache, unspecified, not intractable: Secondary | ICD-10-CM | POA: Insufficient documentation

## 2019-05-10 LAB — CBC
HCT: 41 % (ref 36.0–46.0)
Hemoglobin: 13.4 g/dL (ref 12.0–15.0)
MCH: 27.6 pg (ref 26.0–34.0)
MCHC: 32.7 g/dL (ref 30.0–36.0)
MCV: 84.5 fL (ref 80.0–100.0)
Platelets: 291 10*3/uL (ref 150–400)
RBC: 4.85 MIL/uL (ref 3.87–5.11)
RDW: 13.2 % (ref 11.5–15.5)
WBC: 4.7 10*3/uL (ref 4.0–10.5)
nRBC: 0 % (ref 0.0–0.2)

## 2019-05-10 LAB — COMPREHENSIVE METABOLIC PANEL
ALT: 15 U/L (ref 0–44)
AST: 19 U/L (ref 15–41)
Albumin: 4 g/dL (ref 3.5–5.0)
Alkaline Phosphatase: 65 U/L (ref 38–126)
Anion gap: 6 (ref 5–15)
BUN: 10 mg/dL (ref 6–20)
CO2: 26 mmol/L (ref 22–32)
Calcium: 8.9 mg/dL (ref 8.9–10.3)
Chloride: 105 mmol/L (ref 98–111)
Creatinine, Ser: 0.86 mg/dL (ref 0.44–1.00)
GFR calc Af Amer: >60 mL/min (ref >60)
GFR calc non Af Amer: >60 mL/min (ref >60)
Glucose, Bld: 168 mg/dL — ABNORMAL HIGH (ref 70–99)
Potassium: 3.6 mmol/L (ref 3.5–5.1)
Sodium: 137 mmol/L (ref 135–145)
Total Bilirubin: 1 mg/dL (ref 0.3–1.2)
Total Protein: 7.9 g/dL (ref 6.5–8.1)

## 2019-05-10 MED ORDER — NAPROXEN 500 MG PO TABS: 500.0000 mg | ORAL_TABLET | Freq: Two times a day (BID) | ORAL | 2 refills | Status: DC

## 2019-05-10 MED ORDER — KETOROLAC TROMETHAMINE 30 MG/ML IJ SOLN
30.0000 mg | Freq: Once | INTRAMUSCULAR | Status: AC
  Administered 2019-05-10: 30 mg via INTRAMUSCULAR
  Filled 2019-05-10: qty 1

## 2019-05-10 NOTE — ED Notes (Signed)
Pt alert and oriented X 4, stable for discharge. RR even and unlabored, color WNL. Discussed discharge instructions and follow up when appropriate. Instructed to follow up with ER for any life threatening symptoms or concerns that patient or family of patient may have  
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
## 2019-05-10 NOTE — ED Provider Notes (Signed)
[facility] Emergency Department Provider Note   ____________________________________________    I have reviewed the triage vital signs and the nursing notes.   HISTORY  Chief Complaint Headache     HPI Teresa Meza is a 38 y.o. female who presents with complaints of mild headache over the last 2 days which she describes primarily on the left side of her head, starting at the top of her neck and radiating to her left temple.  She denies neuro deficits.  No nausea or vomiting.  No head injury.  Not on blood thinners.  Has not take anything for this.  History of similar headaches in the past.  Past Medical History:  Diagnosis Date   Ectopic fetus    Elderly multigravida 08/26/2017   [ ]  NIPT [ ]  NST/AFI weekly starting at 36 weeks   Elevated blood pressure reading without diagnosis of hypertension 02/10/2018    Patient Active Problem List   Diagnosis Date Noted   Encounter for induction of labor 02/14/2018   Abdominal wall cellulitis 02/11/2018   Abdominal pain 02/10/2018   Anemia 02/10/2018   Preeclampsia, third trimester 02/10/2018   Indication for care in labor and delivery, antepartum 01/24/2018   Pregnancy 01/24/2018   Cough 11/07/2017   Labor and delivery indication for care or intervention 10/24/2017   Advanced maternal age in multigravida, unspecified trimester 10/23/2017   Gestational diabetes mellitus (GDM) affecting fourth pregnancy 10/14/2017   BMI 37.0-37.9, adult 10/08/2017   Obesity in pregnancy 10/08/2017   Supervision of high risk pregnancy, antepartum 08/26/2017   Hx of gestational diabetes in prior pregnancy, currently pregnant 08/26/2017    Past Surgical History:  Procedure Laterality Date   ECTOPIC PREGNANCY SURGERY      Prior to Admission medications   Medication Sig Start Date End Date Taking? Authorizing Provider  HYDROcodone-acetaminophen (NORCO/VICODIN) 5-325 MG tablet Take 1 tablet by  mouth every 6 (six) hours as needed for moderate pain. 03/22/19   Johnn Hai, PA-C  naproxen (NAPROSYN) 500 MG tablet Take 1 tablet (500 mg total) by mouth 2 (two) times daily with a meal. 05/10/19   Lavonia Drafts, MD  naproxen sodium (ANAPROX DS) 550 MG tablet Take 1 tablet (550 mg total) by mouth 2 (two) times daily with a meal. 03/22/19   Johnn Hai, PA-C     Allergies Sulfa antibiotics  Family History  Problem Relation Age of Onset   Diabetes Mellitus II Mother     Social History Social History   Tobacco Use   Smoking status: Never Smoker   Smokeless tobacco: Never Used  Substance Use Topics   Alcohol use: No   Drug use: No    Review of Systems  Constitutional: No fever/chills  ENT: No sore throat.   Gastrointestinal: No abdominal pain.  No nausea, no vomiting.   Genitourinary: Negative for dysuria. Musculoskeletal: Negative for back pain. Skin: Negative for rash. Neurological: As above    ____________________________________________   PHYSICAL EXAM:  VITAL SIGNS: ED Triage Vitals  Enc Vitals Group     BP 05/10/19 1213 117/78     Pulse Rate 05/10/19 1210 92     Resp 05/10/19 1210 19     Temp 05/10/19 1210 99.1 F (37.3 C)     Temp Source 05/10/19 1210 Oral     SpO2 05/10/19 1210 98 %     Weight 05/10/19 1209 93.9 kg (207 lb)     Height 05/10/19 1209 1.613 m (5' 3.5")  Head Circumference --      Peak Flow --      Pain Score 05/10/19 1213 9     Pain Loc --      Pain Edu? --      Excl. in GC? --      Constitutional: Alert and oriented.  Eyes: Conjunctivae are normal.  PERRLA, EOMI  Nose: No congestion/rhinnorhea. Mouth/Throat: Mucous membranes are moist.   Cardiovascular: Normal rate, regular rhythm.  Respiratory: Normal respiratory effort.  No retractions. Genitourinary: deferred Musculoskeletal: No lower extremity tenderness nor edema.   Neurologic:  Normal speech and language. No gross focal neurologic deficits are  appreciated.  Cranial nerves II through XII are normal Skin:  Skin is warm, dry and intact. No rash noted.   ____________________________________________   LABS (all labs ordered are listed, but only abnormal results are displayed)  Labs Reviewed  COMPREHENSIVE METABOLIC PANEL - Abnormal; Notable for the following components:      Result Value   Glucose, Bld 168 (*)    All other components within normal limits  CBC   ____________________________________________  EKG   ____________________________________________  RADIOLOGY   ____________________________________________   PROCEDURES  Procedure(s) performed: No  Procedures   Critical Care performed: No ____________________________________________   INITIAL IMPRESSION / ASSESSMENT AND PLAN / ED COURSE  Pertinent labs & imaging results that were available during my care of the patient were reviewed by me and considered in my medical decision making (see chart for details).  Patient well-appearing in no acute distress, vital signs are reassuring.  Lab work is unremarkable.  Exam is also reassuring, no neuro deficits.  Describes primarily a tightness on the left side of her head with some tenderness overlying the trapezius insertion site, most consistent with tension headache.  Treated with IM Toradol with good relief of pain.  We will start the patient on naproxen, outpatient follow-up recommended.  Return precautions discussed   ____________________________________________   FINAL CLINICAL IMPRESSION(S) / ED DIAGNOSES  Final diagnoses:  Tension headache      NEW MEDICATIONS STARTED DURING THIS VISIT:  New Prescriptions   NAPROXEN (NAPROSYN) 500 MG TABLET    Take 1 tablet (500 mg total) by mouth 2 (two) times daily with a meal.     Note:  This document was prepared using Dragon voice recognition software and may include unintentional dictation errors.   Jene Every, MD 05/10/19 1530
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
## 2019-08-09 ENCOUNTER — Other Ambulatory Visit: Payer: Self-pay | Admitting: Acute Care

## 2019-08-09 DIAGNOSIS — G44309 Post-traumatic headache, unspecified, not intractable: Secondary | ICD-10-CM

## 2019-08-17 ENCOUNTER — Ambulatory Visit
Admission: RE | Admit: 2019-08-17 | Discharge: 2019-08-17 | Disposition: A | Discharge status: Home / Self Care | Payer: Medicaid Other | Source: Ambulatory Visit | Attending: Acute Care | Admitting: Acute Care

## 2019-08-17 ENCOUNTER — Other Ambulatory Visit: Payer: Self-pay

## 2019-08-17 DIAGNOSIS — G44309 Post-traumatic headache, unspecified, not intractable: Secondary | ICD-10-CM | POA: Insufficient documentation

## 2019-08-17 IMAGING — MR MR HEAD W/O CM
11 series · 42 of 48 positions shown · non-contrast
Comparison: None available.

CLINICAL DATA: Initial evaluation for posttraumatic headache.

EXAM:
MRI HEAD WITHOUT CONTRAST
TECHNIQUE: Multiplanar, multiecho pulse sequences of the brain and surrounding
structures were obtained without intravenous contrast.

[Series 5: ax dwi_tracew · axial · 3.0mm · 0.60mm/px · z∈[-141,+5]mm · 4 of 48 slices shown]
[im 1/48]
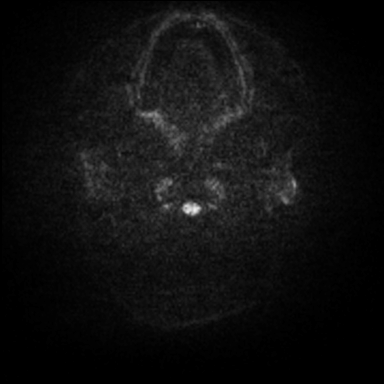
[im 16/48]
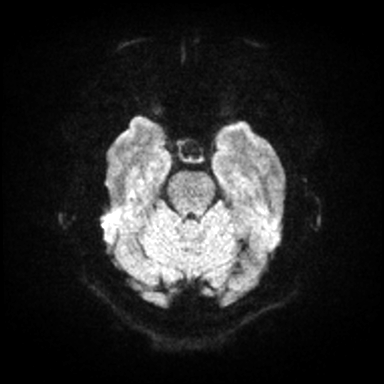
[im 32/48]
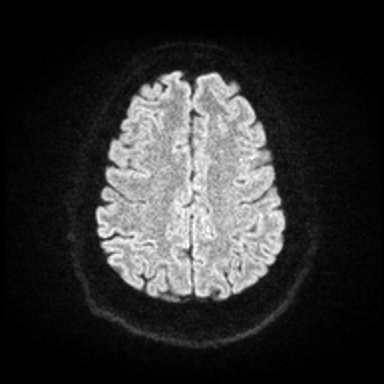
[im 48/48]
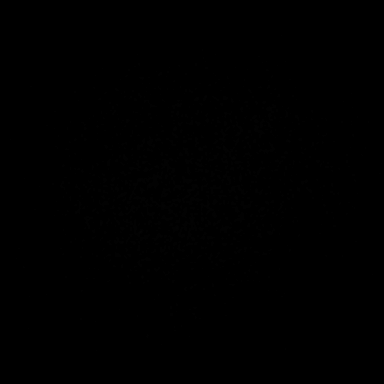

[Series 6: ax dwi_adc · axial · 3.0mm · 0.60mm/px · z∈[-141,+2]mm · 4 of 47 slices shown]
[im 1/47]
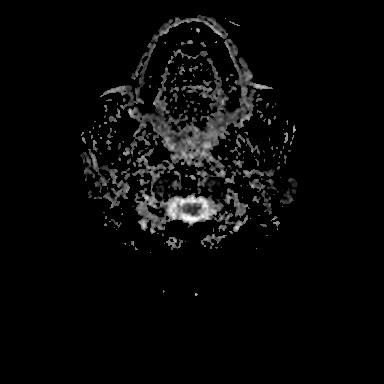
[im 16/47]
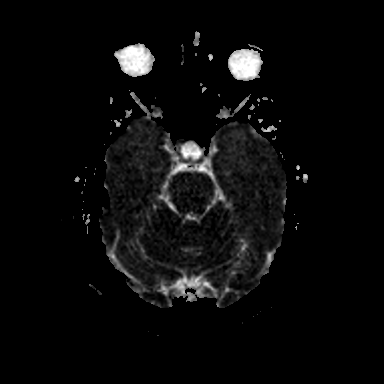
[im 31/47]
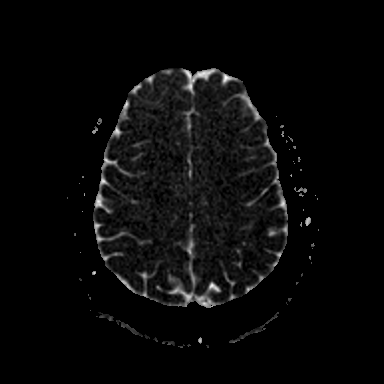
[im 47/47]
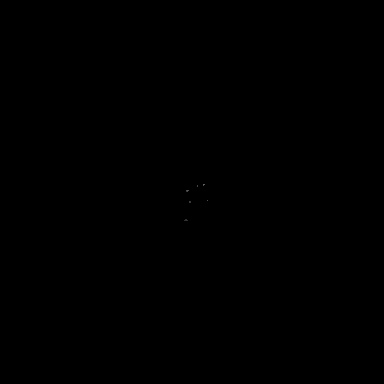

[Series 7: cor dwi_tracew · coronal · 5.0mm · 0.60mm/px · 3 of 40 slices shown]
[im 1/40]
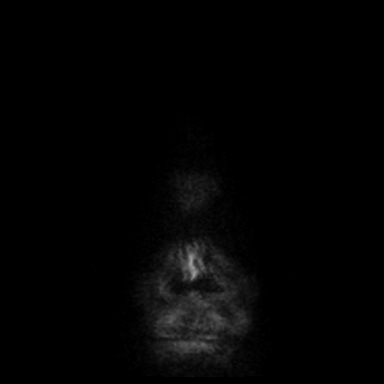
[im 20/40]
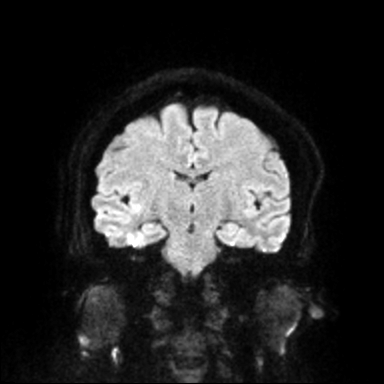
[im 40/40]
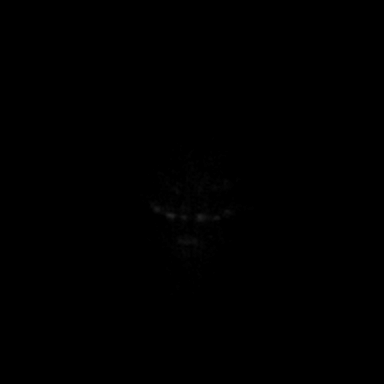

[Series 8: cor dwi_adc · coronal · 5.0mm · 0.60mm/px · 3 of 37 slices shown]
[im 1/37]
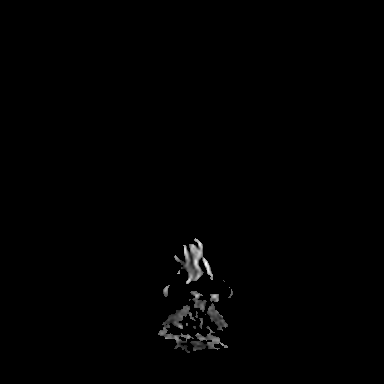
[im 19/37]
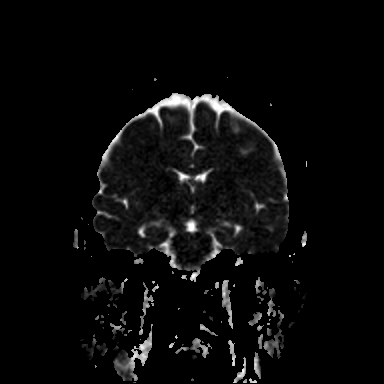
[im 37/37]
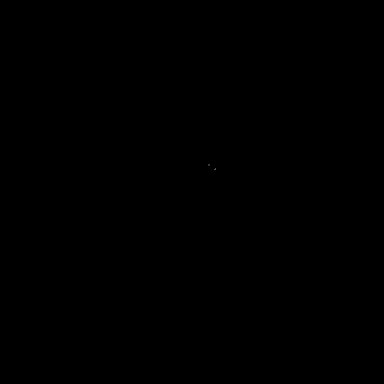

[Series 9: T1 · sagittal · 5.0mm · 0.62mm/px · 2 of 23 slices shown (1 of 2)]
[im 1/23]
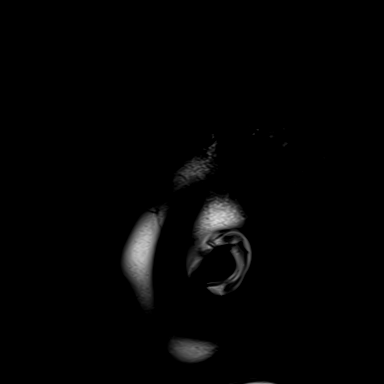
[im 23/23]
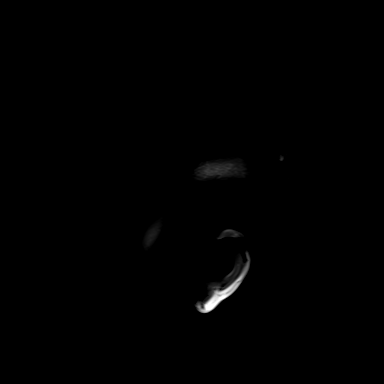

[Series 10: T2 · axial · 5.0mm · 0.53mm/px · z∈[-133,+2]mm · 2 of 25 slices shown (1 of 2)]
[im 1/25]
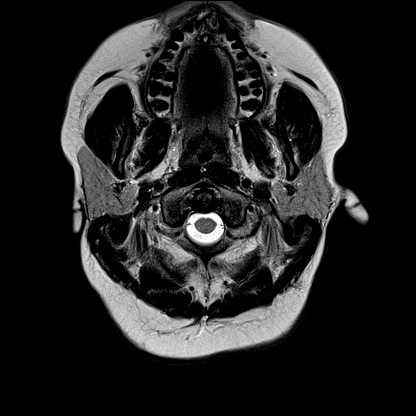
[im 25/25]
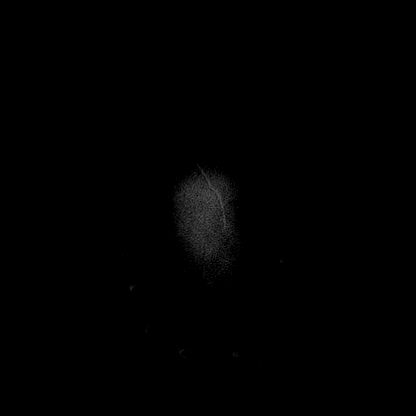

[Series 12: pha_images · axial · 3.0mm · 0.90mm/px · z∈[-150,+17]mm · 5 of 58 slices shown]
[im 1/58]
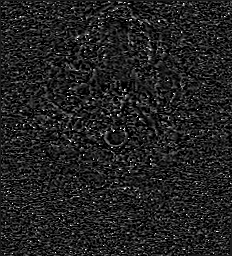
[im 15/58]
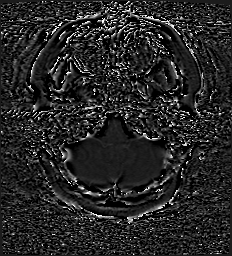
[im 29/58]
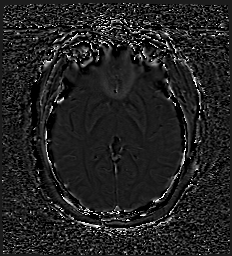
[im 43/58]
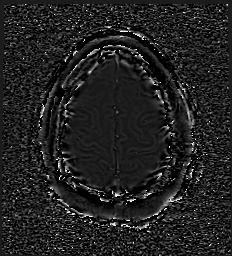
[im 58/58]
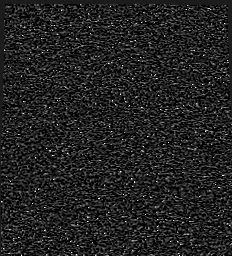

[Series 13: swi_images · axial · 3.0mm · 0.90mm/px · z∈[-150,+17]mm · 5 of 60 slices shown]
[im 1/60]
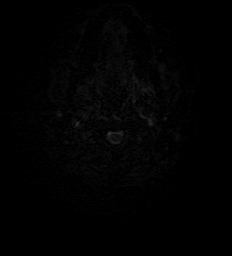
[im 15/60]
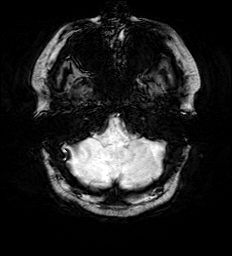
[im 30/60]
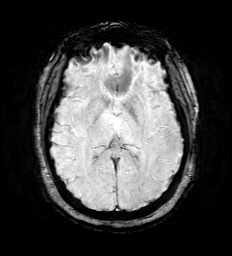
[im 45/60]
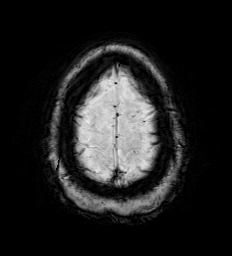
[im 60/60]
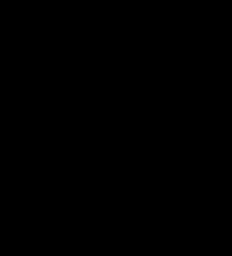

[Series 15: FLAIR · axial · 3.0mm · 0.53mm/px · z∈[-142,+11]mm · 4 of 55 slices shown]
[im 1/55]
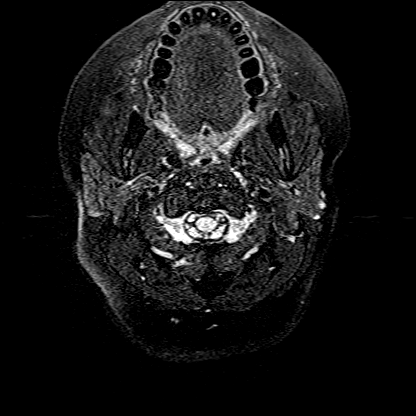
[im 19/55]
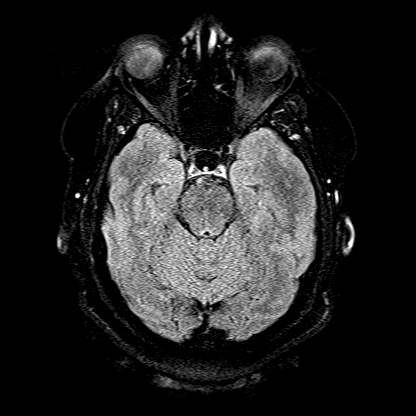
[im 37/55]
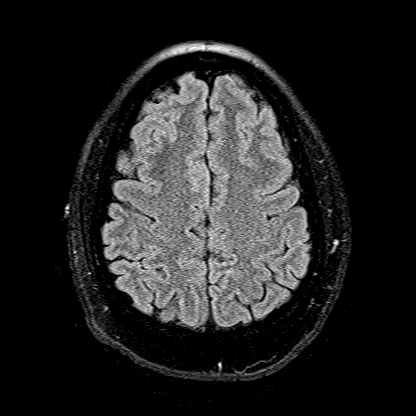
[im 55/55]
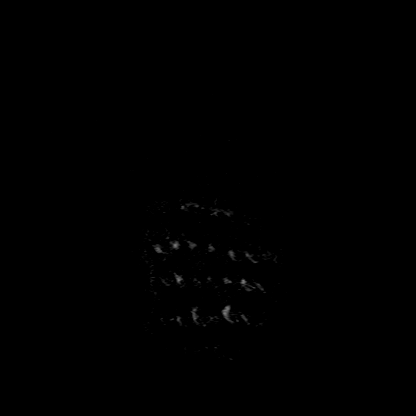

[Series 16: T1 · axial · 1.0mm · 0.98mm/px · z∈[-153,+12]mm · 8 of 176 slices shown (2 of 2)]
[im 1/176]
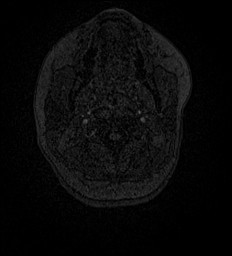
[im 27/176]
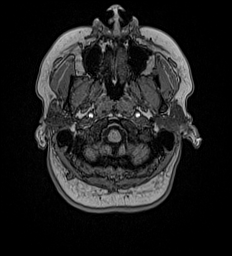
[im 54/176]
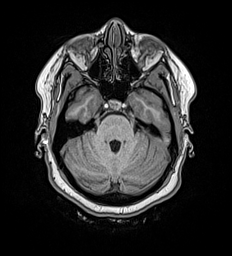
[im 81/176]
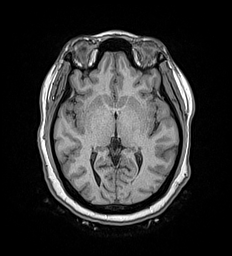
[im 95/176]
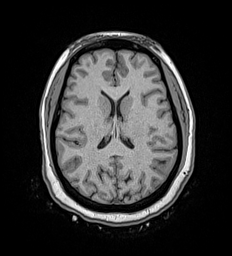
[im 122/176]
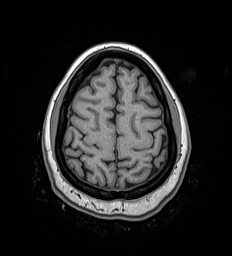
[im 149/176]
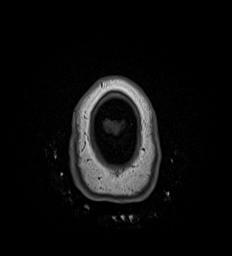
[im 176/176]
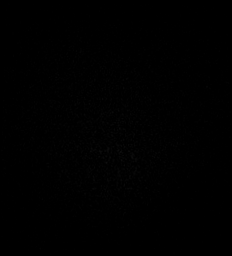

[Series 17: T2 · coronal · 5.0mm · 0.57mm/px · 2 of 31 slices shown (2 of 2)]
[im 1/31]
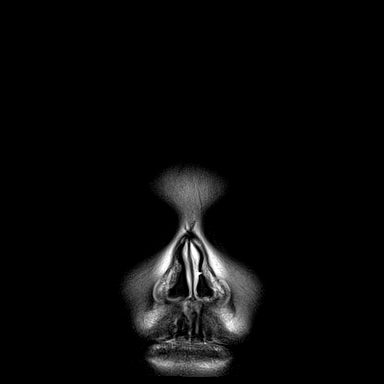
[im 31/31]
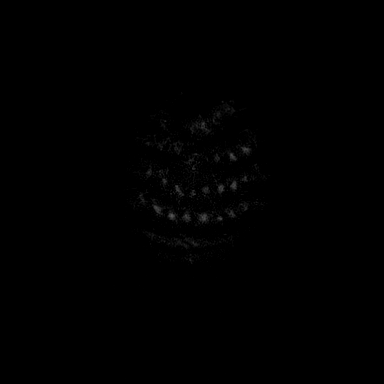

[42 of 48 positions shown; findings below may reference images not displayed]

FINDINGS: Brain: Cerebral volume within normal limits for patient age. No
focal parenchymal signal abnormality identified.

No abnormal foci of restricted diffusion to suggest acute or
subacute ischemia. Gray-white matter differentiation well
maintained. No encephalomalacia to suggest chronic infarction. No
foci of susceptibility artifact to suggest acute or chronic
intracranial hemorrhage.

No mass lesion, midline shift or mass effect. No hydrocephalus. No
extra-axial fluid collection. Major dural sinuses are grossly
patent.

Empty sella. Suprasellar region within normal limits. Midline
structures intact and normal.

Vascular: Major intracranial vascular flow voids well maintained and
normal in appearance.

Skull and upper cervical spine: Craniocervical junction normal.
Visualized upper cervical spine within normal limits. Bone marrow
signal intensity normal. No scalp soft tissue abnormality.

Sinuses/Orbits: Globes and orbital soft tissues within normal
limits.

Paranasal sinuses are clear. No mastoid effusion. Inner ear
structures normal.

Other: None.
IMPRESSION: 1. Empty sella. While this finding is often incidental in nature and
of no clinical significance, this can also be seen in the setting of
idiopathic intracranial hypertension.
2. Otherwise unremarkable and normal brain MRI. No acute
intracranial abnormality.

## 2019-09-15 DIAGNOSIS — G932 Benign intracranial hypertension: Secondary | ICD-10-CM | POA: Insufficient documentation

## 2020-09-01 DIAGNOSIS — Z87828 Personal history of other (healed) physical injury and trauma: Secondary | ICD-10-CM | POA: Insufficient documentation

## 2020-09-01 DIAGNOSIS — H04209 Unspecified epiphora, unspecified lacrimal gland: Secondary | ICD-10-CM | POA: Insufficient documentation

## 2020-12-30 ENCOUNTER — Emergency Department
Admission: EM | Admit: 2020-12-30 | Discharge: 2020-12-30 | Disposition: A | Discharge status: Home / Self Care | Payer: Worker's Compensation | Attending: Emergency Medicine | Admitting: Emergency Medicine

## 2020-12-30 ENCOUNTER — Other Ambulatory Visit: Payer: Self-pay

## 2020-12-30 ENCOUNTER — Emergency Department: Payer: Worker's Compensation

## 2020-12-30 DIAGNOSIS — Y990 Civilian activity done for income or pay: Secondary | ICD-10-CM | POA: Insufficient documentation

## 2020-12-30 DIAGNOSIS — S39012A Strain of muscle, fascia and tendon of lower back, initial encounter: Secondary | ICD-10-CM | POA: Diagnosis not present

## 2020-12-30 DIAGNOSIS — M25551 Pain in right hip: Secondary | ICD-10-CM | POA: Insufficient documentation

## 2020-12-30 DIAGNOSIS — W010XXA Fall on same level from slipping, tripping and stumbling without subsequent striking against object, initial encounter: Secondary | ICD-10-CM | POA: Insufficient documentation

## 2020-12-30 DIAGNOSIS — W19XXXA Unspecified fall, initial encounter: Secondary | ICD-10-CM

## 2020-12-30 DIAGNOSIS — S63501A Unspecified sprain of right wrist, initial encounter: Secondary | ICD-10-CM

## 2020-12-30 DIAGNOSIS — M25561 Pain in right knee: Secondary | ICD-10-CM | POA: Insufficient documentation

## 2020-12-30 DIAGNOSIS — R200 Anesthesia of skin: Secondary | ICD-10-CM | POA: Insufficient documentation

## 2020-12-30 DIAGNOSIS — S6991XA Unspecified injury of right wrist, hand and finger(s), initial encounter: Secondary | ICD-10-CM | POA: Diagnosis present

## 2020-12-30 IMAGING — CR DG WRIST COMPLETE 3+V*R*
4 series · 4 of 4 positions shown · non-contrast
Comparison: None.

CLINICAL DATA: Fell at work.  Right wrist pain.

EXAM:
RIGHT WRIST - COMPLETE 3+ VIEW

[wrist pa]
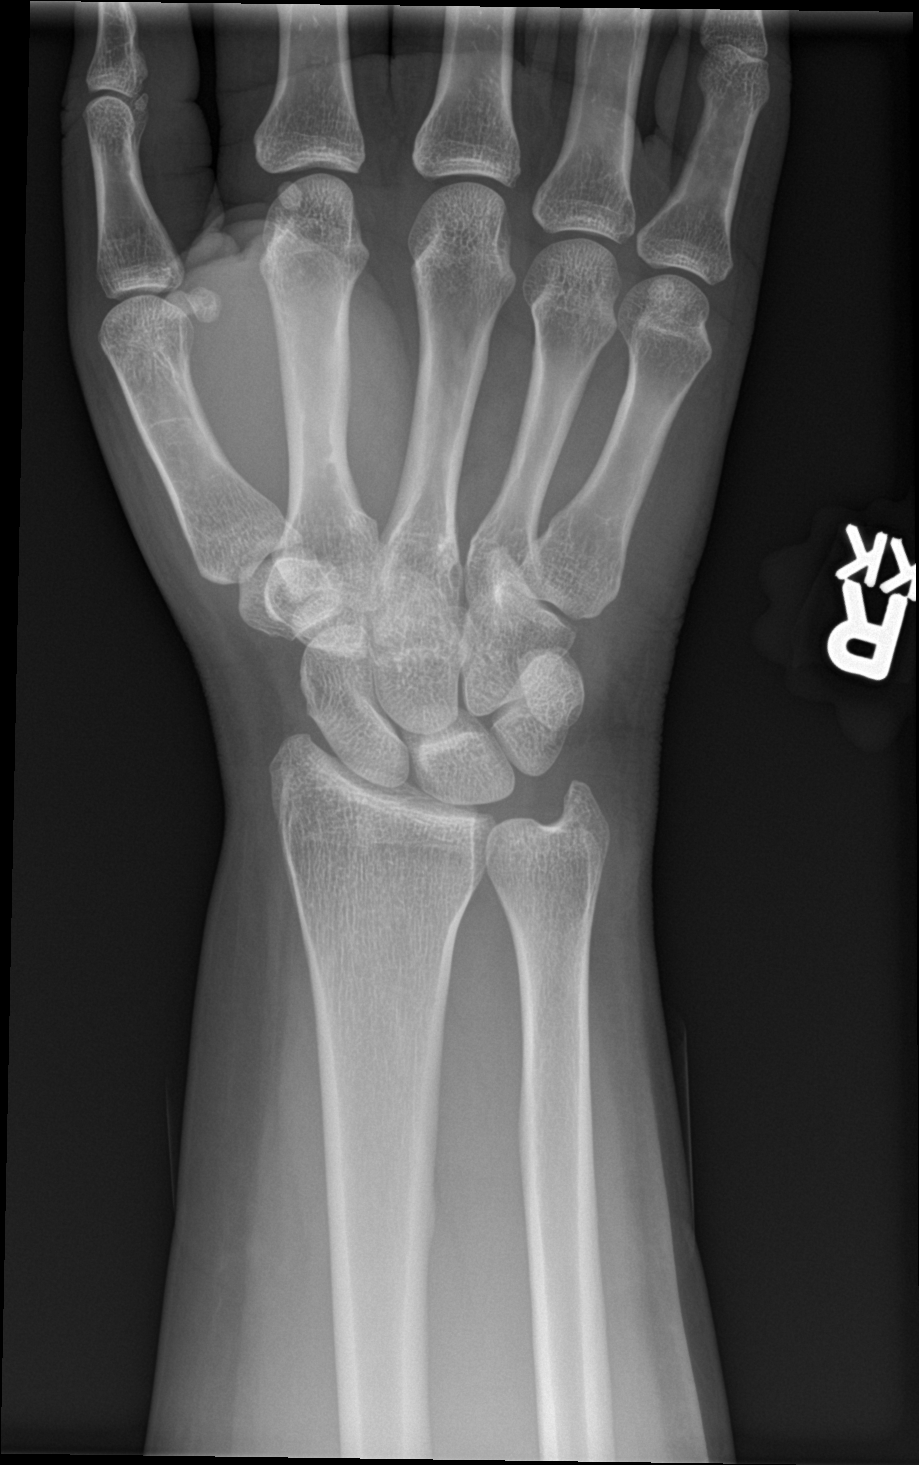

[wrist obl]
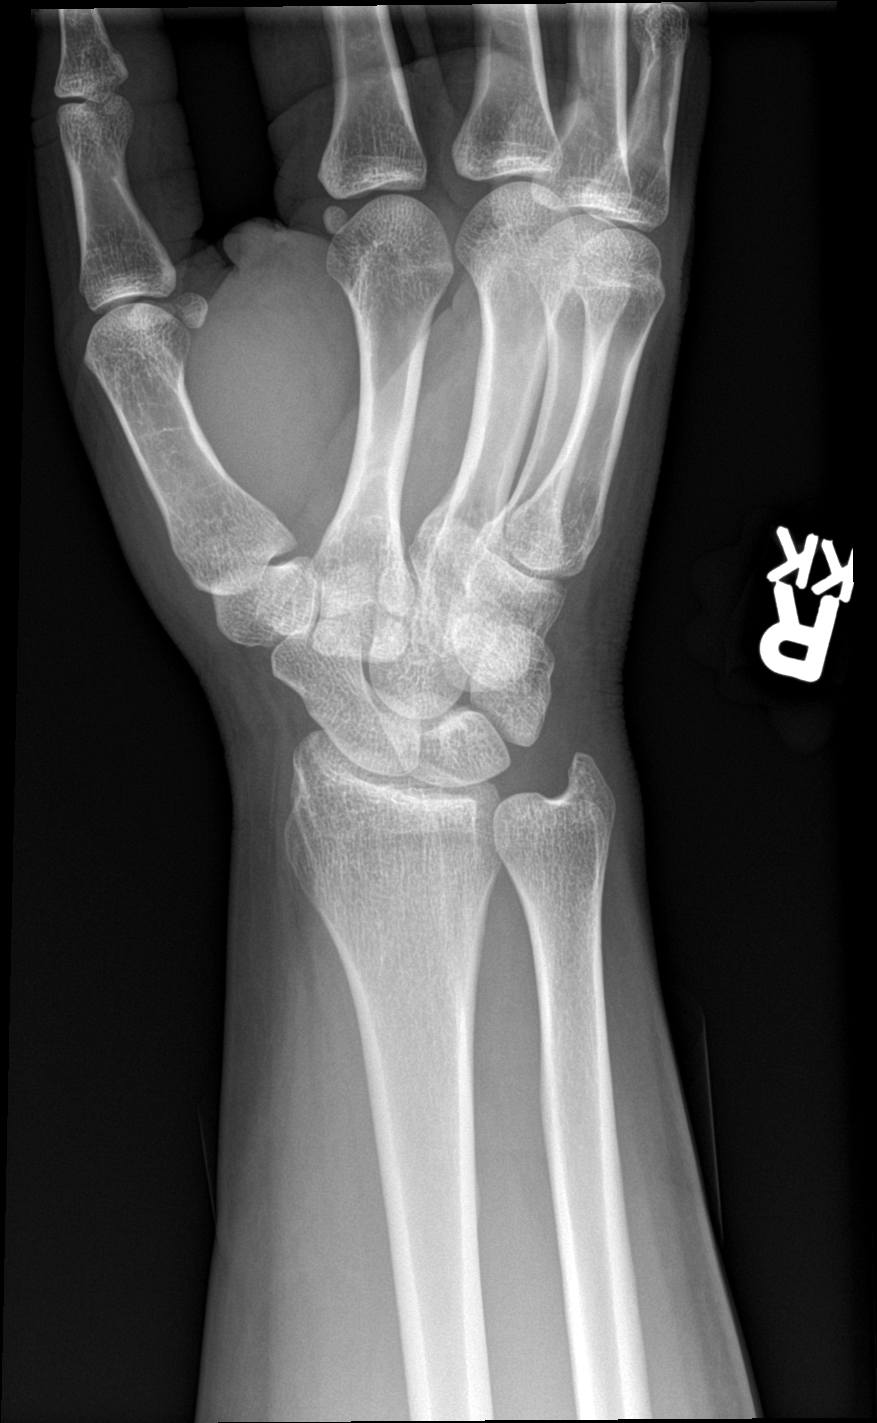

[wrist lat]
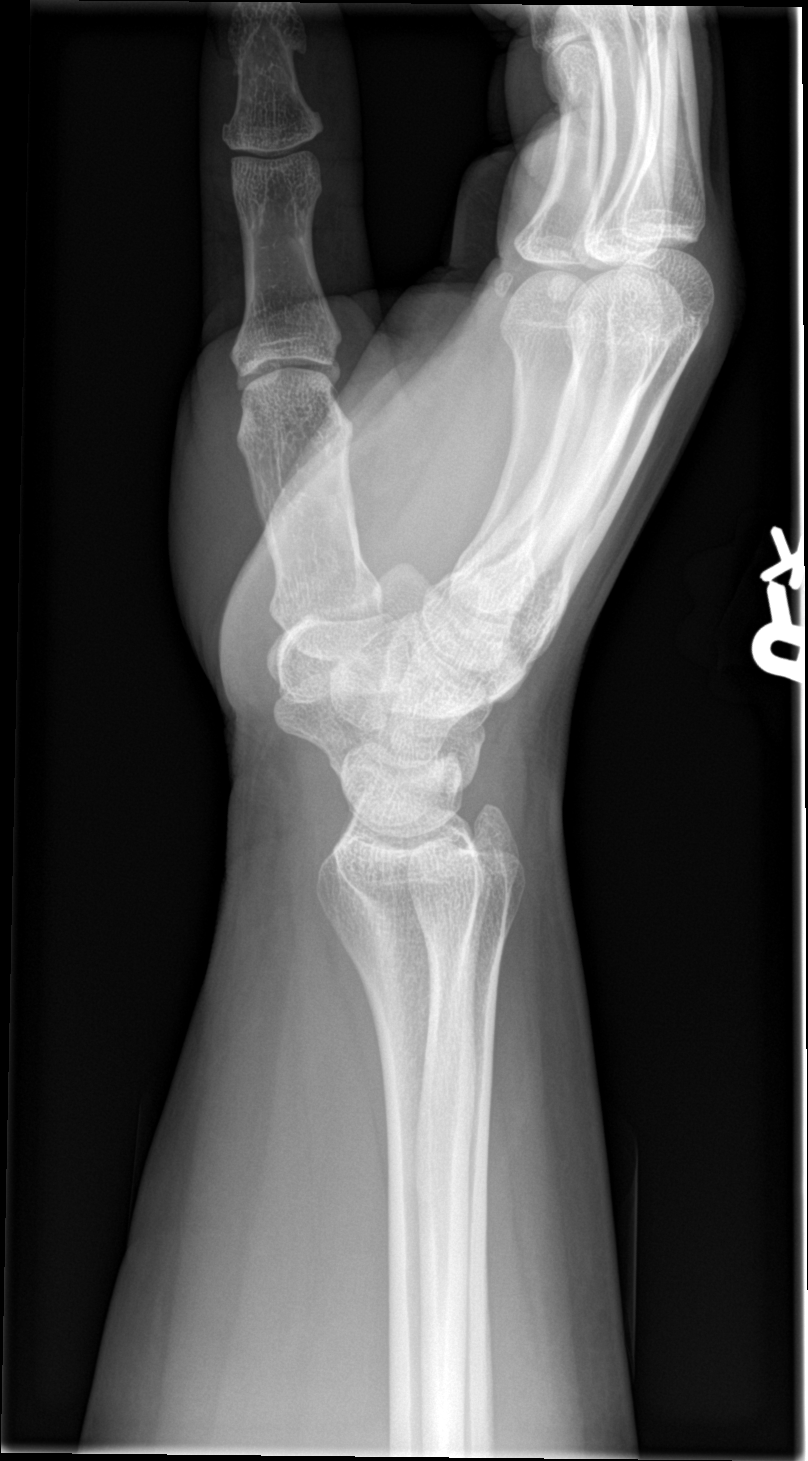

[navicular]
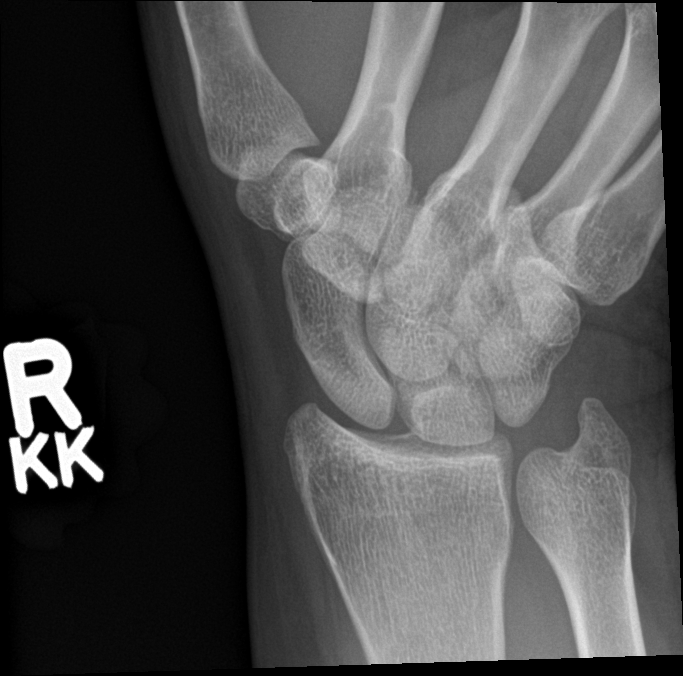

[4 of 4 positions shown; findings below may reference images not displayed]

FINDINGS: The joint spaces are maintained. No acute wrist fracture is
identified.
IMPRESSION: No acute bony findings.

## 2020-12-30 IMAGING — CR DG KNEE COMPLETE 4+V*R*
4 series · 4 of 4 positions shown · non-contrast
Comparison: None.

CLINICAL DATA: Fell today.  Left knee pain.

EXAM:
RIGHT KNEE - COMPLETE 4+ VIEW

[knee ap]
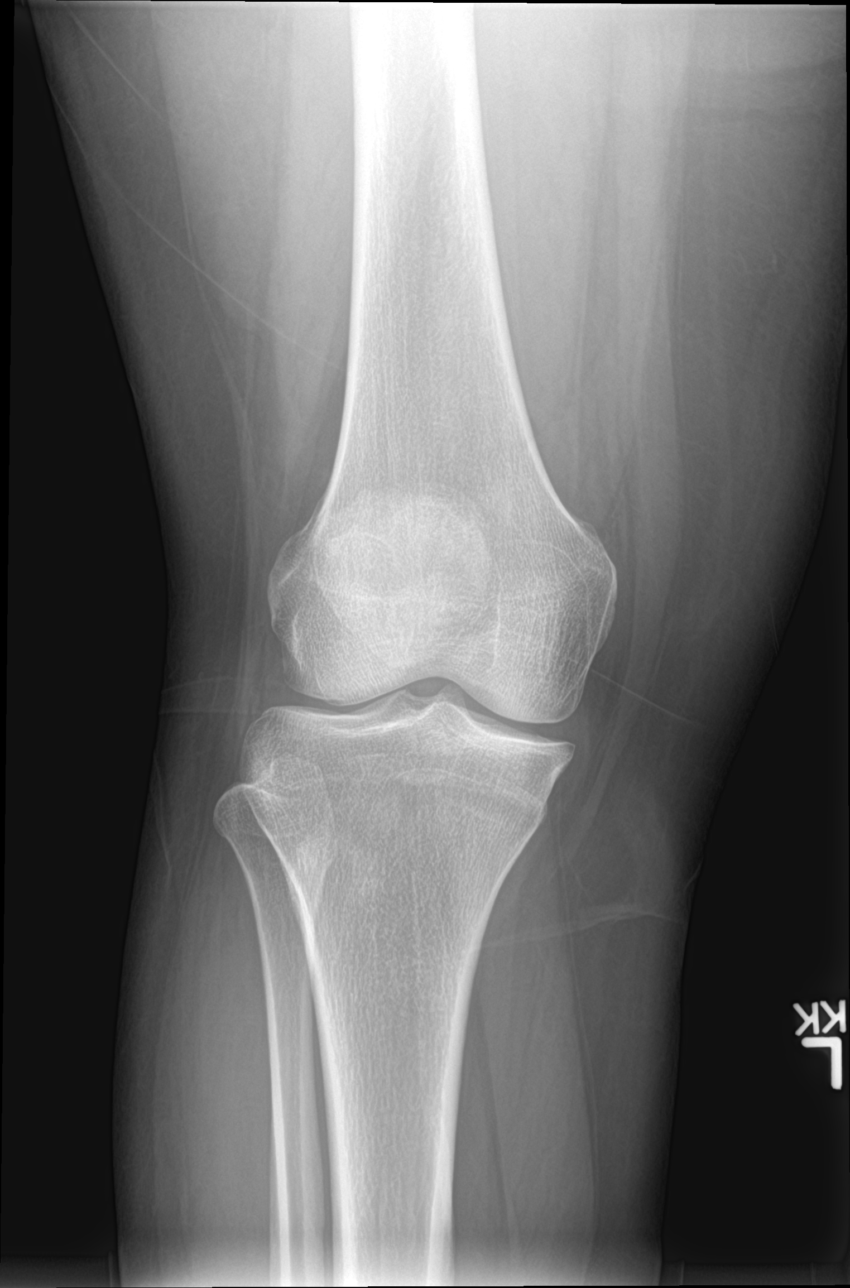

[knee obl (1 of 2)]
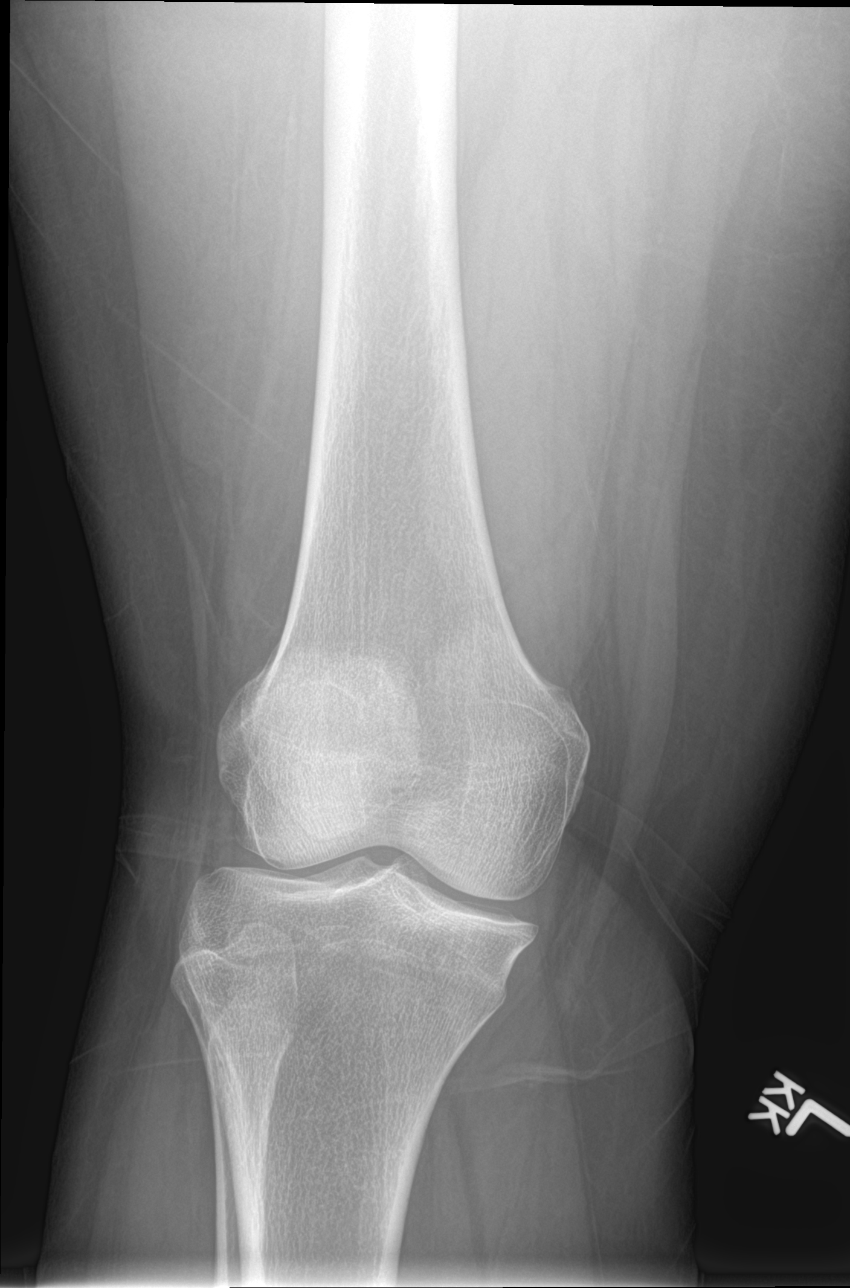

[knee obl (2 of 2)]
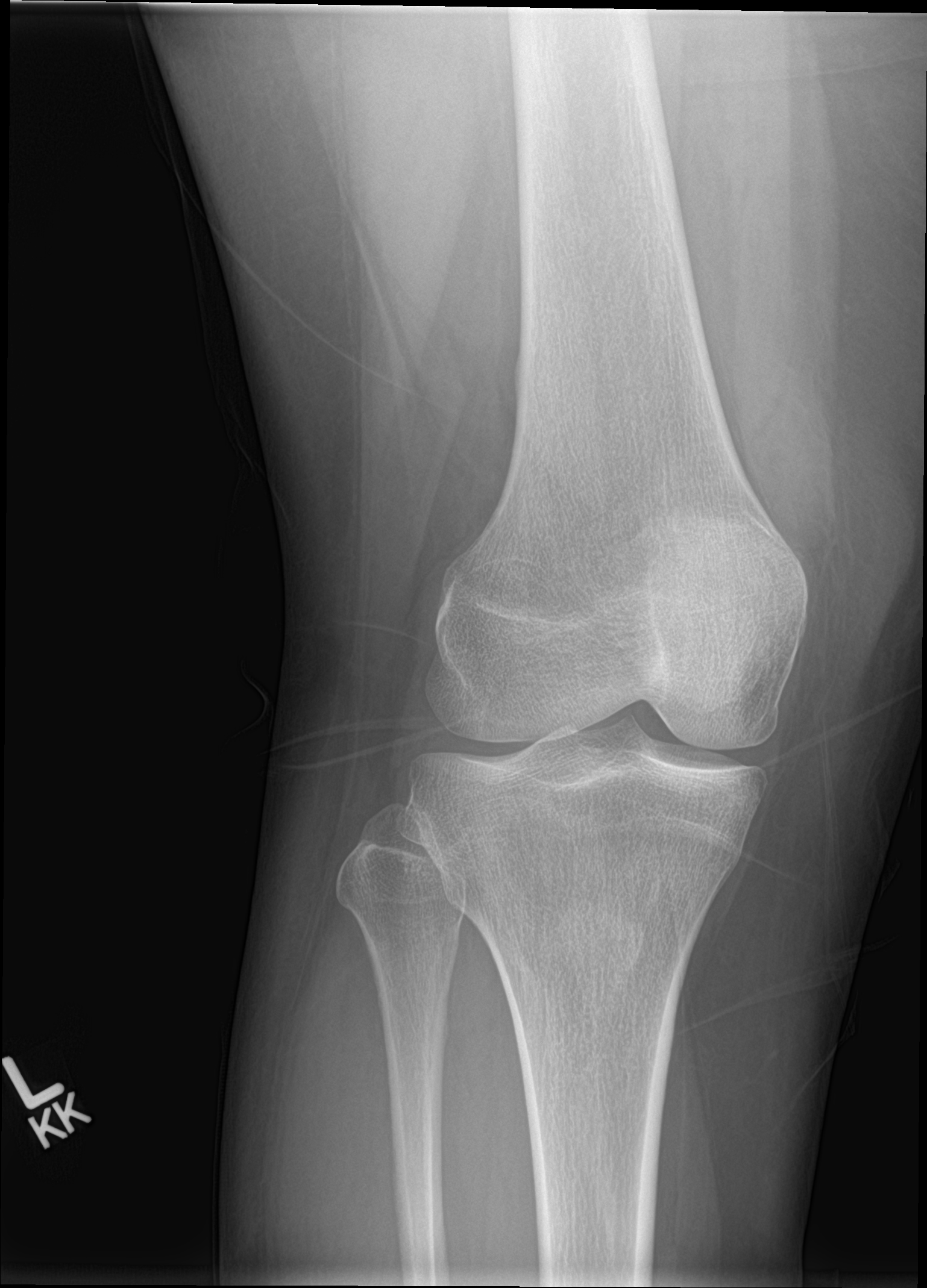

[knee lat]
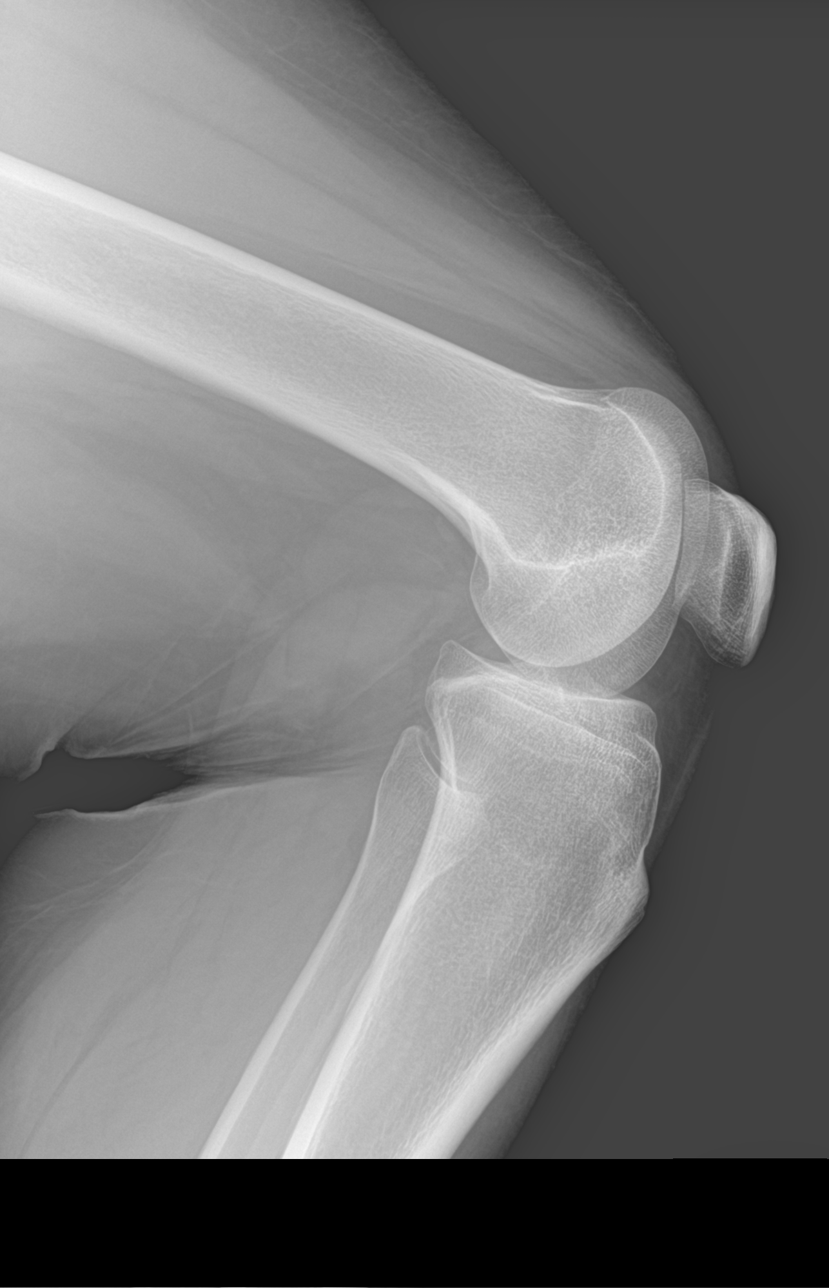

[4 of 4 positions shown; findings below may reference images not displayed]

FINDINGS: The joint spaces are maintained. No acute fracture is identified. No
degenerative changes. No obvious joint effusion.
IMPRESSION: No acute bony findings.

## 2020-12-30 IMAGING — CR DG LUMBAR SPINE 2-3V
2 series · 2 of 2 positions shown · non-contrast
Comparison: CT scan [DATE].

CLINICAL DATA: Fell at work.  Back pain.

EXAM:
LUMBAR SPINE - 2-3 VIEW

[l-spine ap]
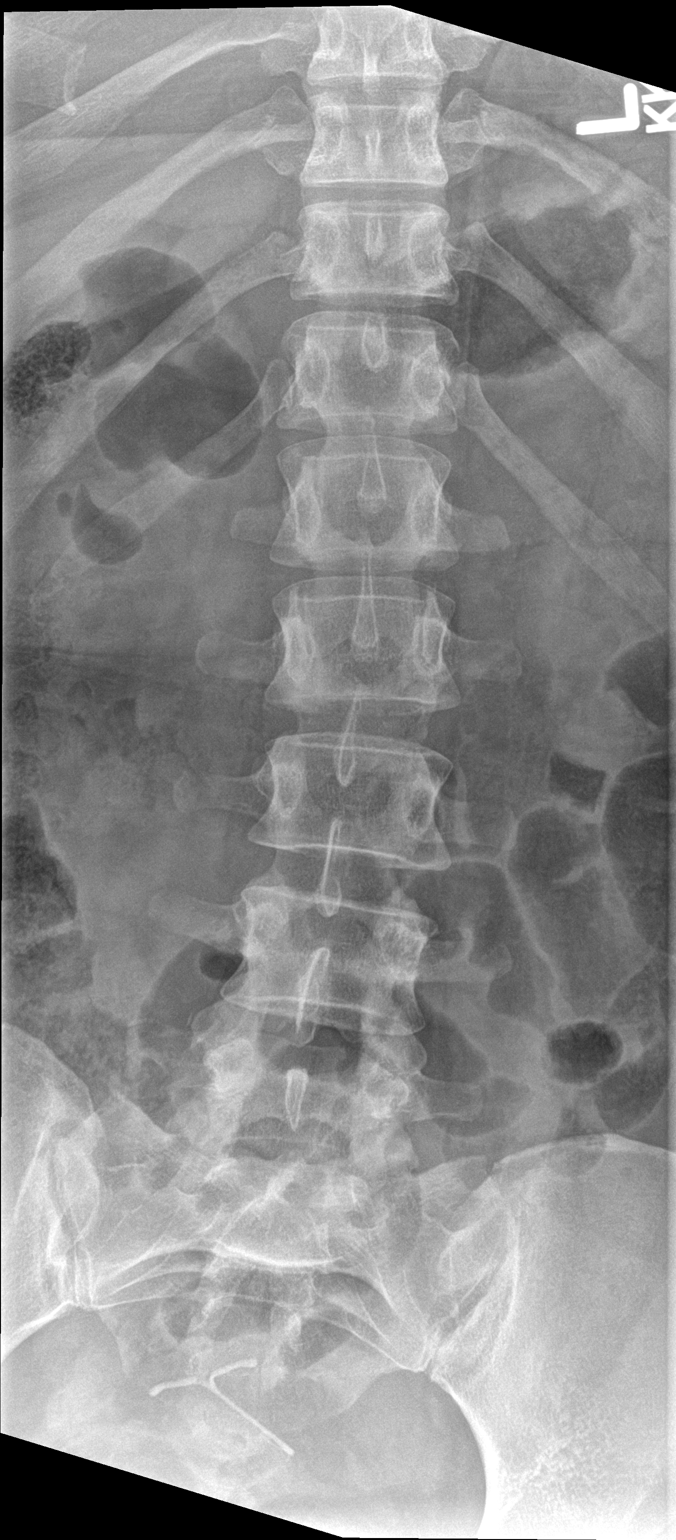

[l-spine lat]
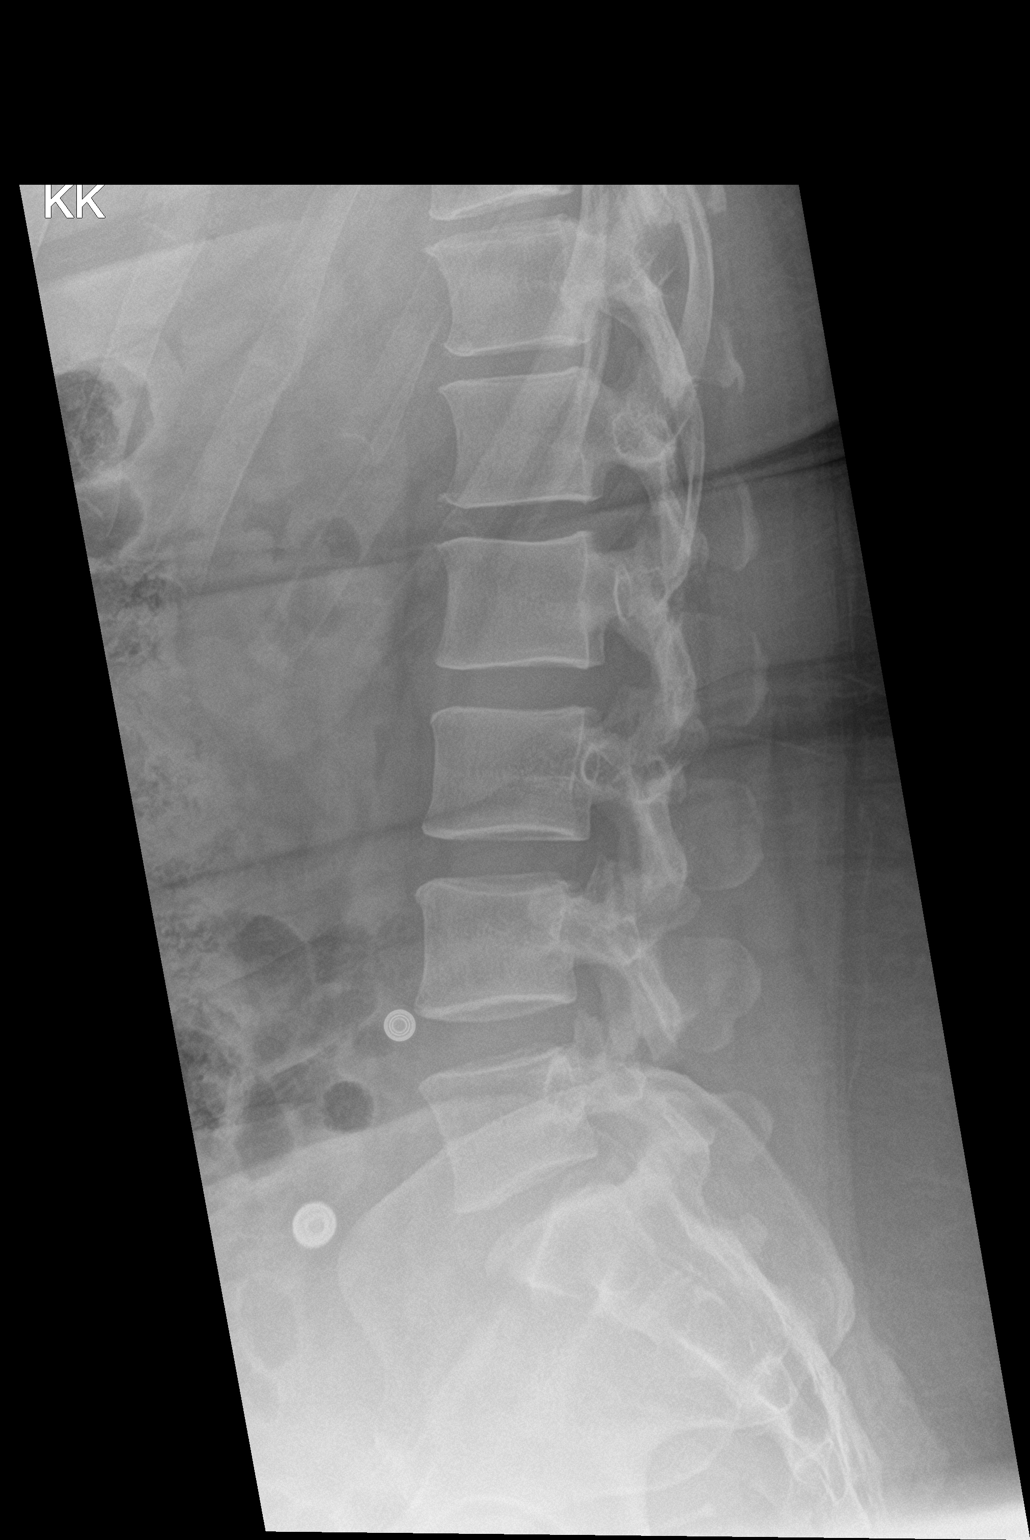

[2 of 2 positions shown; findings below may reference images not displayed]

FINDINGS: Normal alignment of the lumbar vertebral bodies. Disc spaces and
vertebral bodies are maintained. The facets are normally aligned. No
pars defects. The visualized bony pelvis is intact.
IMPRESSION: Normal alignment and no acute bony findings.

## 2020-12-30 IMAGING — CR DG HIP (WITH OR WITHOUT PELVIS) 2-3V*R*
3 series · 3 of 3 positions shown · non-contrast
Comparison: None.

CLINICAL DATA: Fell at work.  Right hip pain.

EXAM:
DG HIP (WITH OR WITHOUT PELVIS) 2-3V RIGHT

[pelvis ap]
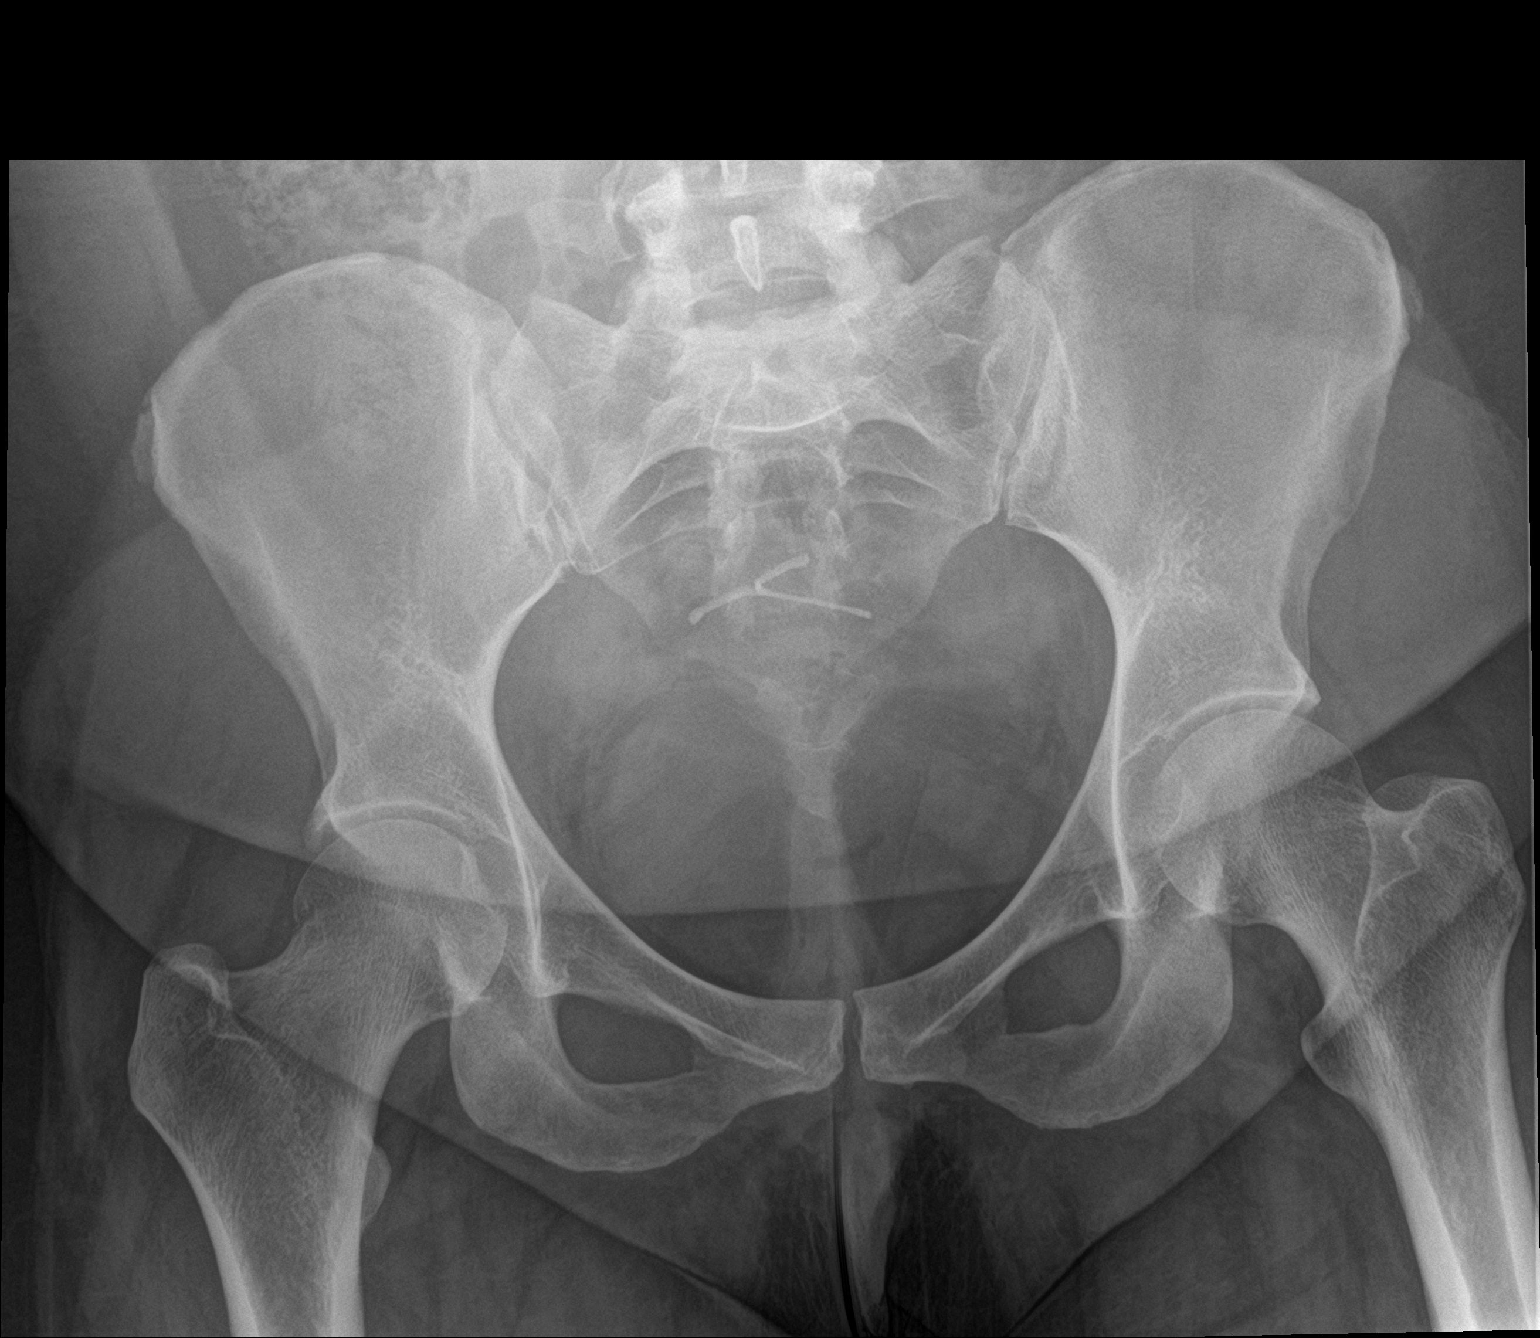

[hip ap]
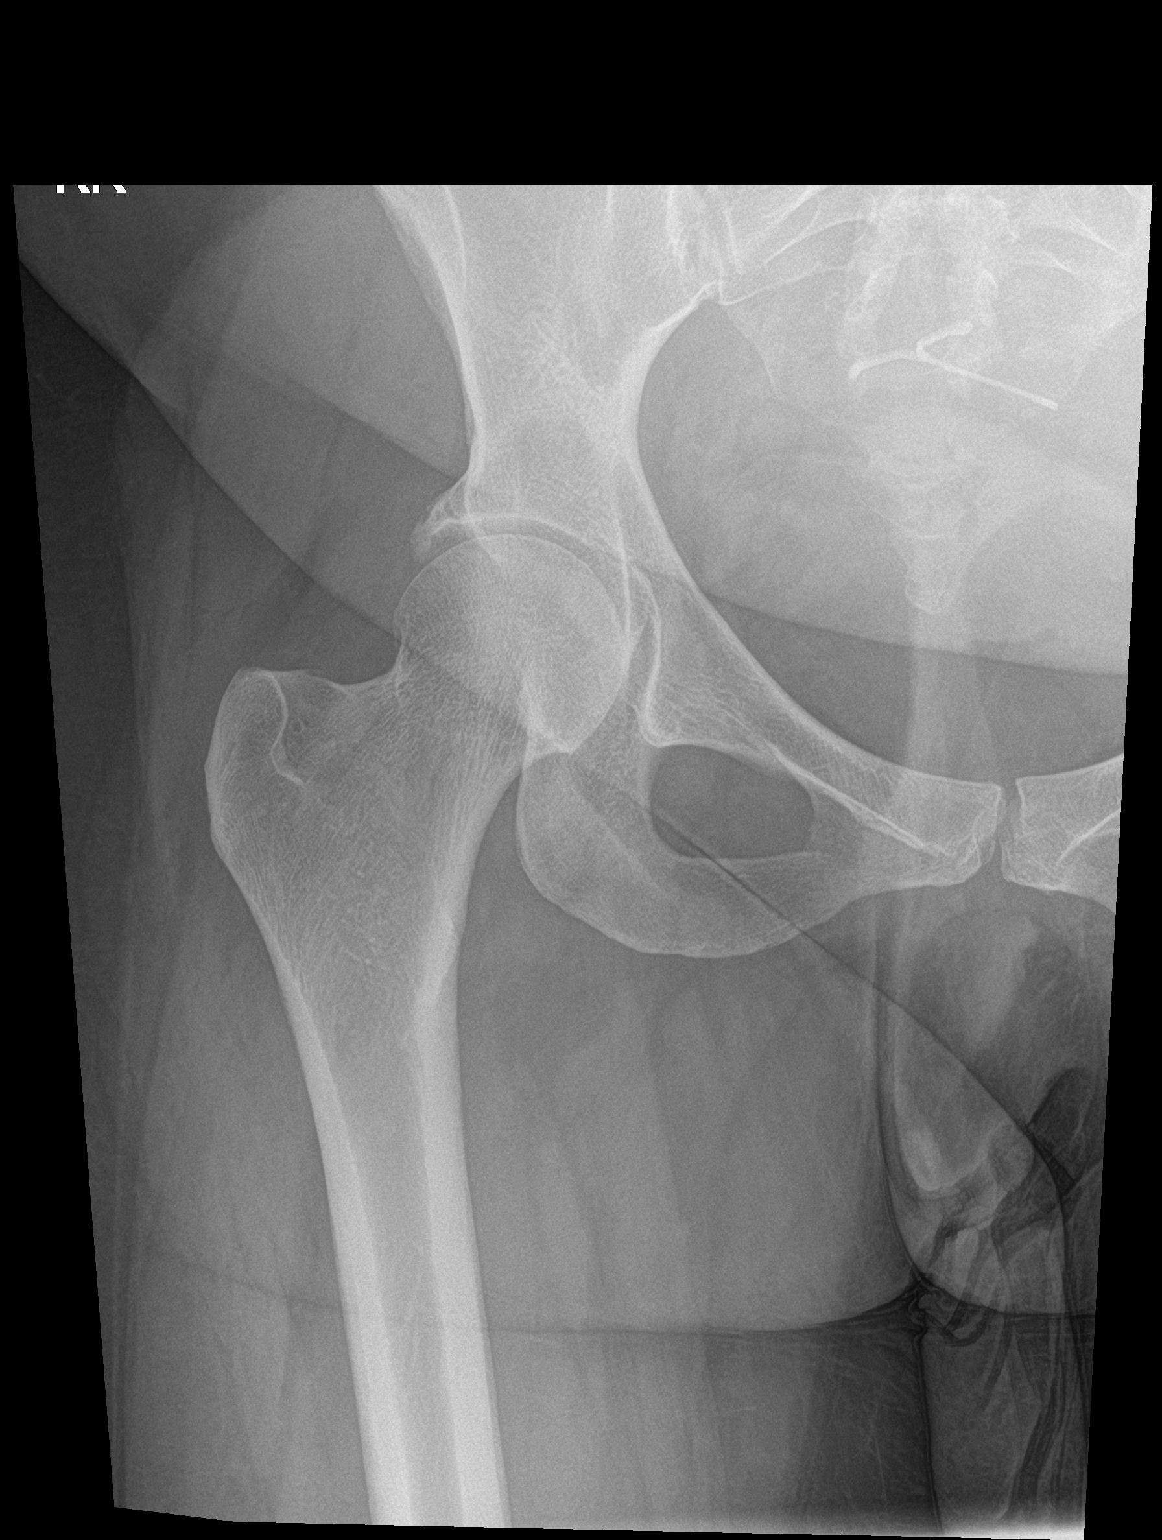

[hip lat]
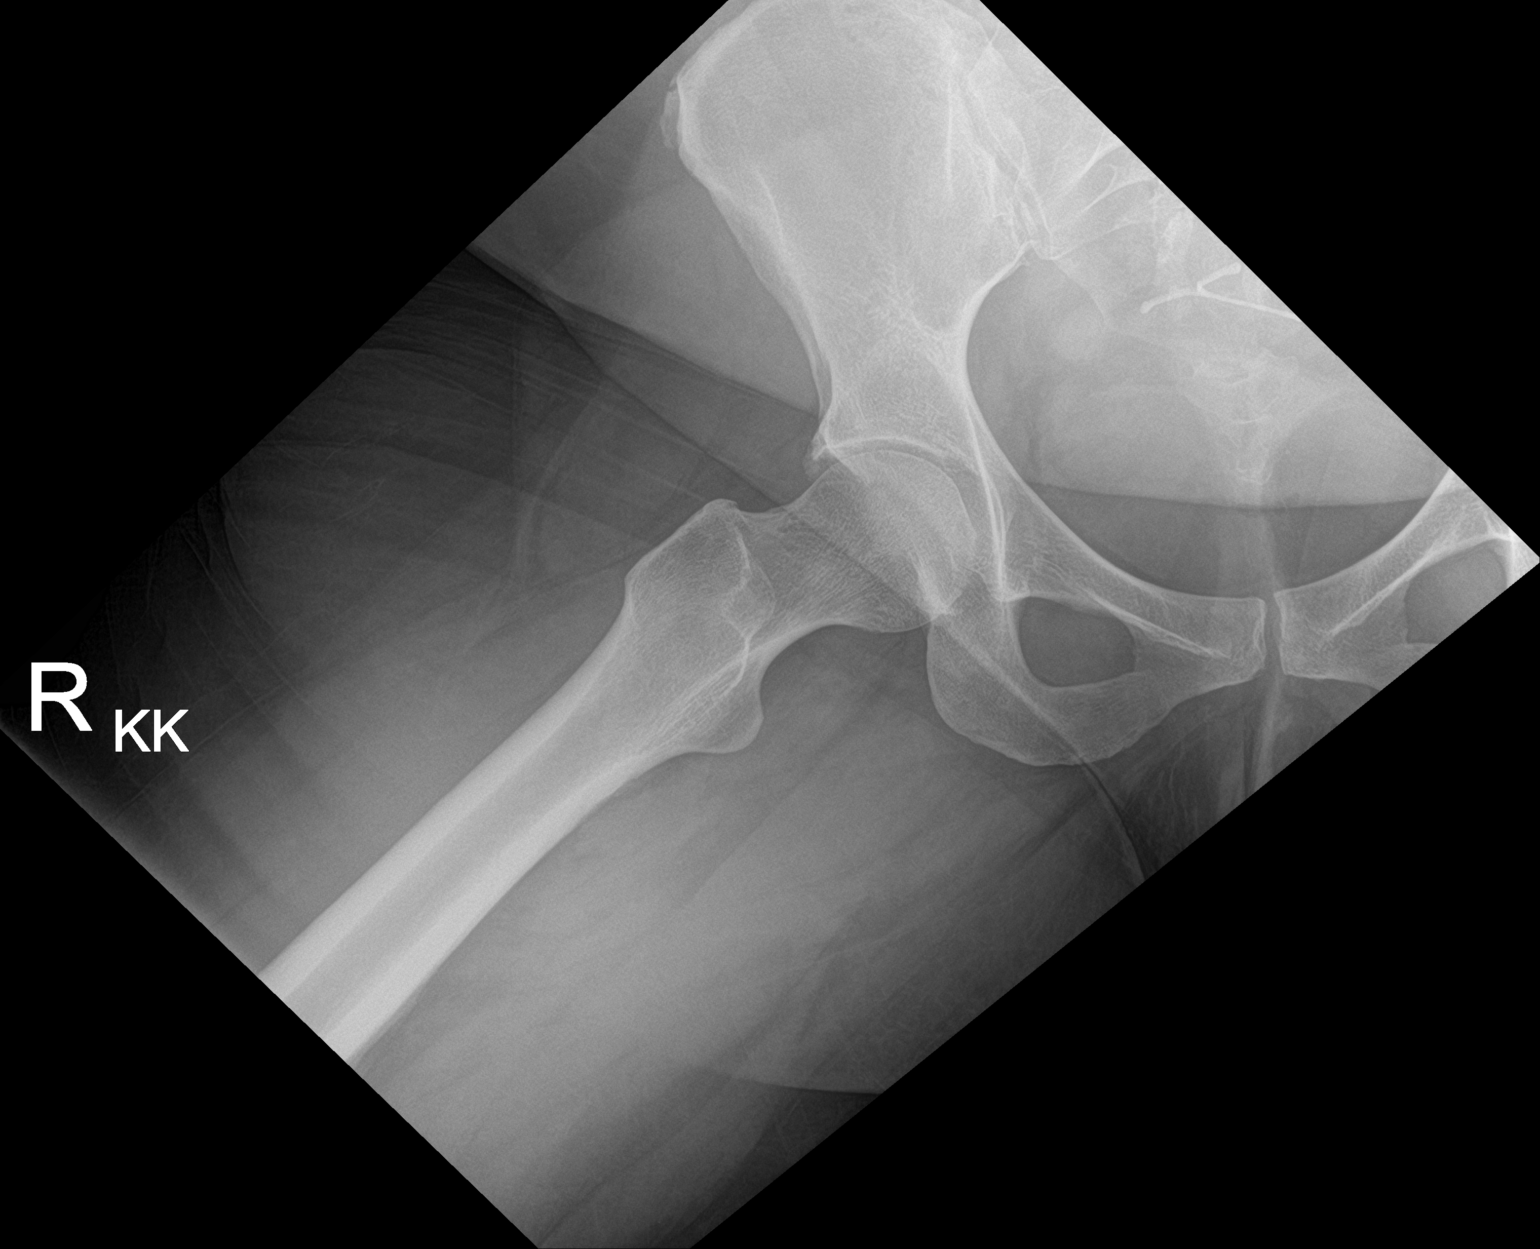

[3 of 3 positions shown; findings below may reference images not displayed]

FINDINGS: Both hips are normally located. No hip fracture or degenerative
changes. The pubic symphysis and SI joints are intact. No pelvic
fractures or bone lesions. An IUD is noted in the central pelvis.
IMPRESSION: No acute bony findings.

## 2020-12-30 MED ORDER — MELOXICAM 15 MG PO TABS: 15.0000 mg | ORAL_TABLET | Freq: Every day | ORAL | 2 refills | Status: DC

## 2020-12-30 MED ORDER — BACLOFEN 10 MG PO TABS: 10.0000 mg | ORAL_TABLET | Freq: Three times a day (TID) | ORAL | 0 refills | Status: AC

## 2020-12-30 NOTE — ED Triage Notes (Signed)
Pt via POV from work. Pt states she works at the [employer] and slipped on some butter and fell. Pt c/o L wrist, hand, knee, and hip pain. Denies head injury. Denies blood thinners. Pt is a WC pt.

## 2020-12-30 NOTE — ED Notes (Signed)
Wrist splint applied to right wrist. CMS intact. Patient provided instruction manual.  Discharge instructions, follow up care, and work recommendations provided by the PA discussed with patient. Patient verbalized understanding. Patient denies questions or concerns about discharge.

## 2020-12-30 NOTE — ED Notes (Signed)
ED NT at bedside for worker's comp drug screen.

## 2020-12-30 NOTE — Discharge Instructions (Addendum)
Apply ice to all areas that hurt Return if worsening Follow up with orthopedics of workers comp choice in 5 days Take the medication as needed

## 2020-12-30 NOTE — ED Provider Notes (Signed)
[facility] Emergency Department Provider Note  ____________________________________________   Event Date/Time   First MD Initiated Contact with Patient 12/30/20 1333     (approximate)  I have reviewed the triage vital signs and the nursing notes.   HISTORY  Chief Complaint Fall    HPI Teresa Meza is a 40 y.o. female presents emergency department after a fall at work.  Patient works at [employer].  States she stepped into spilled liquid butter, states that she fell forward landing on her right wrist, right knee.  No head injury.  Is complaining of low back pain and right hip pain.  States she has some numbness and tingling.  States she went to split type position but she felt like she turned her hip sideways.  She denies any other injuries.  Past Medical History:  Diagnosis Date   Ectopic fetus    Elderly multigravida 08/26/2017   [ ]  NIPT [ ]  NST/AFI weekly starting at 36 weeks   Elevated blood pressure reading without diagnosis of hypertension 02/10/2018    Patient Active Problem List   Diagnosis Date Noted   Encounter for induction of labor 02/14/2018   Abdominal wall cellulitis 02/11/2018   Abdominal pain 02/10/2018   Anemia 02/10/2018   Preeclampsia, third trimester 02/10/2018   Indication for care in labor and delivery, antepartum 01/24/2018   Pregnancy 01/24/2018   Cough 11/07/2017   Labor and delivery indication for care or intervention 10/24/2017   Advanced maternal age in multigravida, unspecified trimester 10/23/2017   Gestational diabetes mellitus (GDM) affecting fourth pregnancy 10/14/2017   BMI 37.0-37.9, adult 10/08/2017   Obesity in pregnancy 10/08/2017   Supervision of high risk pregnancy, antepartum 08/26/2017   Hx of gestational diabetes in prior pregnancy, currently pregnant 08/26/2017    Past Surgical History:  Procedure Laterality Date   ECTOPIC PREGNANCY SURGERY      Prior to Admission medications    Medication Sig Start Date End Date Taking? Authorizing Provider  baclofen (LIORESAL) 10 MG tablet Take 1 tablet (10 mg total) by mouth 3 (three) times daily for 7 days. 12/30/20 01/06/21 Yes Dalbert Stillings, 03/01/21, PA-C  meloxicam (MOBIC) 15 MG tablet Take 1 tablet (15 mg total) by mouth daily. 12/30/20 12/30/21 Yes Samer Dutton, 03/01/21, PA-C    Allergies Sulfa antibiotics  Family History  Problem Relation Age of Onset   Diabetes Mellitus II Mother     Social History Social History   Tobacco Use   Smoking status: Never   Smokeless tobacco: Never  Substance Use Topics   Alcohol use: No   Drug use: No    Review of Systems  Constitutional: No fever/chills Eyes: No visual changes. ENT: No sore throat. Respiratory: Denies cough Cardiovascular: Denies chest pain Gastrointestinal: Denies abdominal pain Genitourinary: Negative for dysuria. Musculoskeletal: Positive for back pain, right wrist pain, low back pain, right hip pain, and right knee pain Skin: Negative for rash. Psychiatric: no mood changes,     ____________________________________________   PHYSICAL EXAM:  VITAL SIGNS: ED Triage Vitals [12/30/20 1125]  Enc Vitals Group     BP (!) 146/86     Pulse Rate 75     Resp 16     Temp 98.9 F (37.2 C)     Temp Source Oral     SpO2 100 %     Weight 201 lb (91.2 kg)     Height 5\' 3"  (1.6 m)     Head Circumference  Peak Flow      Pain Score 10     Pain Loc      Pain Edu?      Excl. in GC?     Constitutional: Alert and oriented. Well appearing and in no acute distress. Eyes: Conjunctivae are normal.  Head: Atraumatic. Nose: No congestion/rhinnorhea. Mouth/Throat: Mucous membranes are moist.   Neck:  supple no lymphadenopathy noted Cardiovascular: Normal rate, regular rhythm. Heart sounds are normal Respiratory: Normal respiratory effort.  No retractions, lungs c t a  Abd: soft nontender bs normal all 4 quad GU: deferred Musculoskeletal: FROM all extremities, warm  and well perfused, C-spine is nontender, right shoulder is not tender for bony tenderness but trapezius muscle spasms and tender, lumbar spine is tender to palpation, right hip is tender to palpation, right knee is tender to palpation, right wrist is swollen and tender, grip is equal bilaterally.  Neurovascular symptoms Neurologic:  Normal speech and language.  Skin:  Skin is warm, dry and intact. No rash noted. Psychiatric: Mood and affect are normal. Speech and behavior are normal.  ____________________________________________   LABS (all labs ordered are listed, but only abnormal results are displayed)  Labs Reviewed - No data to display ____________________________________________   ____________________________________________  RADIOLOGY  X-ray of the lumbar spine, or right hip, right wrist, and right knee  ____________________________________________   PROCEDURES  Procedure(s) performed: No  Procedures    ____________________________________________   INITIAL IMPRESSION / ASSESSMENT AND PLAN / ED COURSE  Pertinent labs & imaging results that were available during my care of the patient were reviewed by me and considered in my medical decision making (see chart for details).   Patient is 40 year old female presents emergency department after a fall at work.  See HPI.  Physical exam shows patient per stable  X-ray imaging of the right wrist, lumbar spine, right hip, and right knee ordered.  X-rays reviewed by me and confirmed by radiology to be negative  I did explain the findings to the patient.  She was placed in a wrist brace, she is to take meloxicam for pain and inflammation, baclofen for muscle strain.  Did discuss how Worker's Comp. would work.  She is to take her note back to her employer with her restrictions.  Follow-up with orthopedics in 5 to 6 days.  If her Worker's Comp. insurance does not cover [facility] she will need to have them make an  appointment for her with an appropriate orthopedist.  Return emergency department worsening.  And apply ice to all areas that hurt.  She was discharged in stable condition with a work restrictions note sent with her.  Teresa Meza was evaluated in Emergency Department on 12/30/2020 for the symptoms described in the history of present illness. She was evaluated in the context of the global COVID-19 pandemic, which necessitated consideration that the patient might be at risk for infection with the SARS-CoV-2 virus that causes COVID-19. Institutional protocols and algorithms that pertain to the evaluation of patients at risk for COVID-19 are in a state of rapid change based on information released by regulatory bodies including the CDC and federal and state organizations. These policies and algorithms were followed during the patient's care in the ED.    As part of my medical decision making, I reviewed the following data within the electronic MEDICAL RECORD NUMBER Nursing notes reviewed and incorporated, Old chart reviewed, Radiograph reviewed , Notes from prior ED visits, and [place] Controlled Substance Database  ____________________________________________  FINAL CLINICAL IMPRESSION(S) / ED DIAGNOSES  Final diagnoses:  Fall, initial encounter  Right wrist sprain, initial encounter  Lumbar strain, initial encounter      NEW MEDICATIONS STARTED DURING THIS VISIT:  Discharge Medication List as of 12/30/2020  3:28 PM     START taking these medications   Details  baclofen (LIORESAL) 10 MG tablet Take 1 tablet (10 mg total) by mouth 3 (three) times daily for 7 days., Starting Sat 12/30/2020, Until Sat 01/06/2021, Normal    meloxicam (MOBIC) 15 MG tablet Take 1 tablet (15 mg total) by mouth daily., Starting Sat 12/30/2020, Until Sun 12/30/2021, Normal         Note:  This document was prepared using Dragon voice recognition software and may include unintentional dictation errors.    Faythe Ghee, PA-C 12/30/20 Flossie Buffy    Minna Antis, MD 12/31/20 1309

## 2020-12-30 NOTE — ED Notes (Signed)
NT Cala Bradford to place wrist brace soon.

## 2021-01-02 ENCOUNTER — Emergency Department: Payer: Worker's Compensation

## 2021-01-02 ENCOUNTER — Encounter: Payer: Self-pay | Admitting: *Deleted

## 2021-01-02 ENCOUNTER — Emergency Department
Admission: EM | Admit: 2021-01-02 | Discharge: 2021-01-02 | Disposition: A | Discharge status: Home / Self Care | Payer: Worker's Compensation | Attending: Emergency Medicine | Admitting: Emergency Medicine

## 2021-01-02 ENCOUNTER — Other Ambulatory Visit: Payer: Self-pay

## 2021-01-02 DIAGNOSIS — Y990 Civilian activity done for income or pay: Secondary | ICD-10-CM | POA: Insufficient documentation

## 2021-01-02 DIAGNOSIS — W010XXA Fall on same level from slipping, tripping and stumbling without subsequent striking against object, initial encounter: Secondary | ICD-10-CM | POA: Insufficient documentation

## 2021-01-02 DIAGNOSIS — S199XXA Unspecified injury of neck, initial encounter: Secondary | ICD-10-CM | POA: Diagnosis present

## 2021-01-02 DIAGNOSIS — S29012A Strain of muscle and tendon of back wall of thorax, initial encounter: Secondary | ICD-10-CM | POA: Diagnosis not present

## 2021-01-02 DIAGNOSIS — S161XXA Strain of muscle, fascia and tendon at neck level, initial encounter: Secondary | ICD-10-CM

## 2021-01-02 DIAGNOSIS — S29019A Strain of muscle and tendon of unspecified wall of thorax, initial encounter: Secondary | ICD-10-CM

## 2021-01-02 IMAGING — CR DG CERVICAL SPINE 2 OR 3 VIEWS
1 series · 3 of 3 positions shown · non-contrast
Comparison: None.

CLINICAL DATA: Pain after fall

EXAM:
CERVICAL SPINE - 2-3 VIEW

[Series 1: dg cervical spine 2 or 3 views · 0.14mm/px · 3 of 3 slices shown]
[im 1/3]
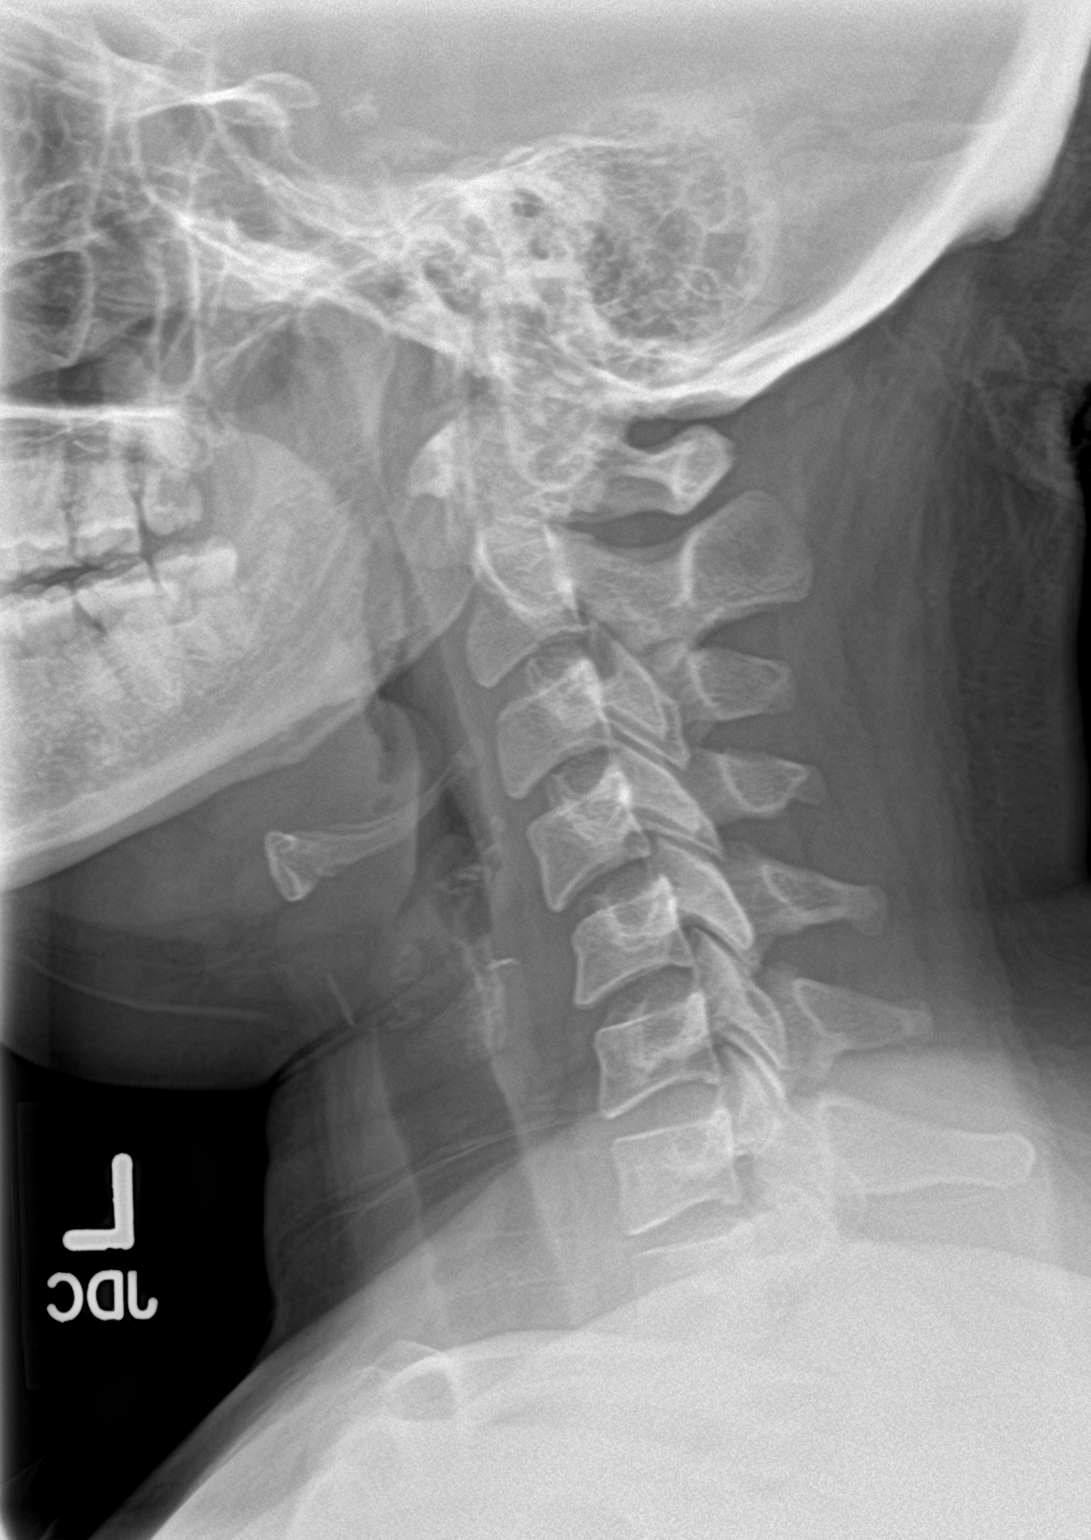
[im 2/3]
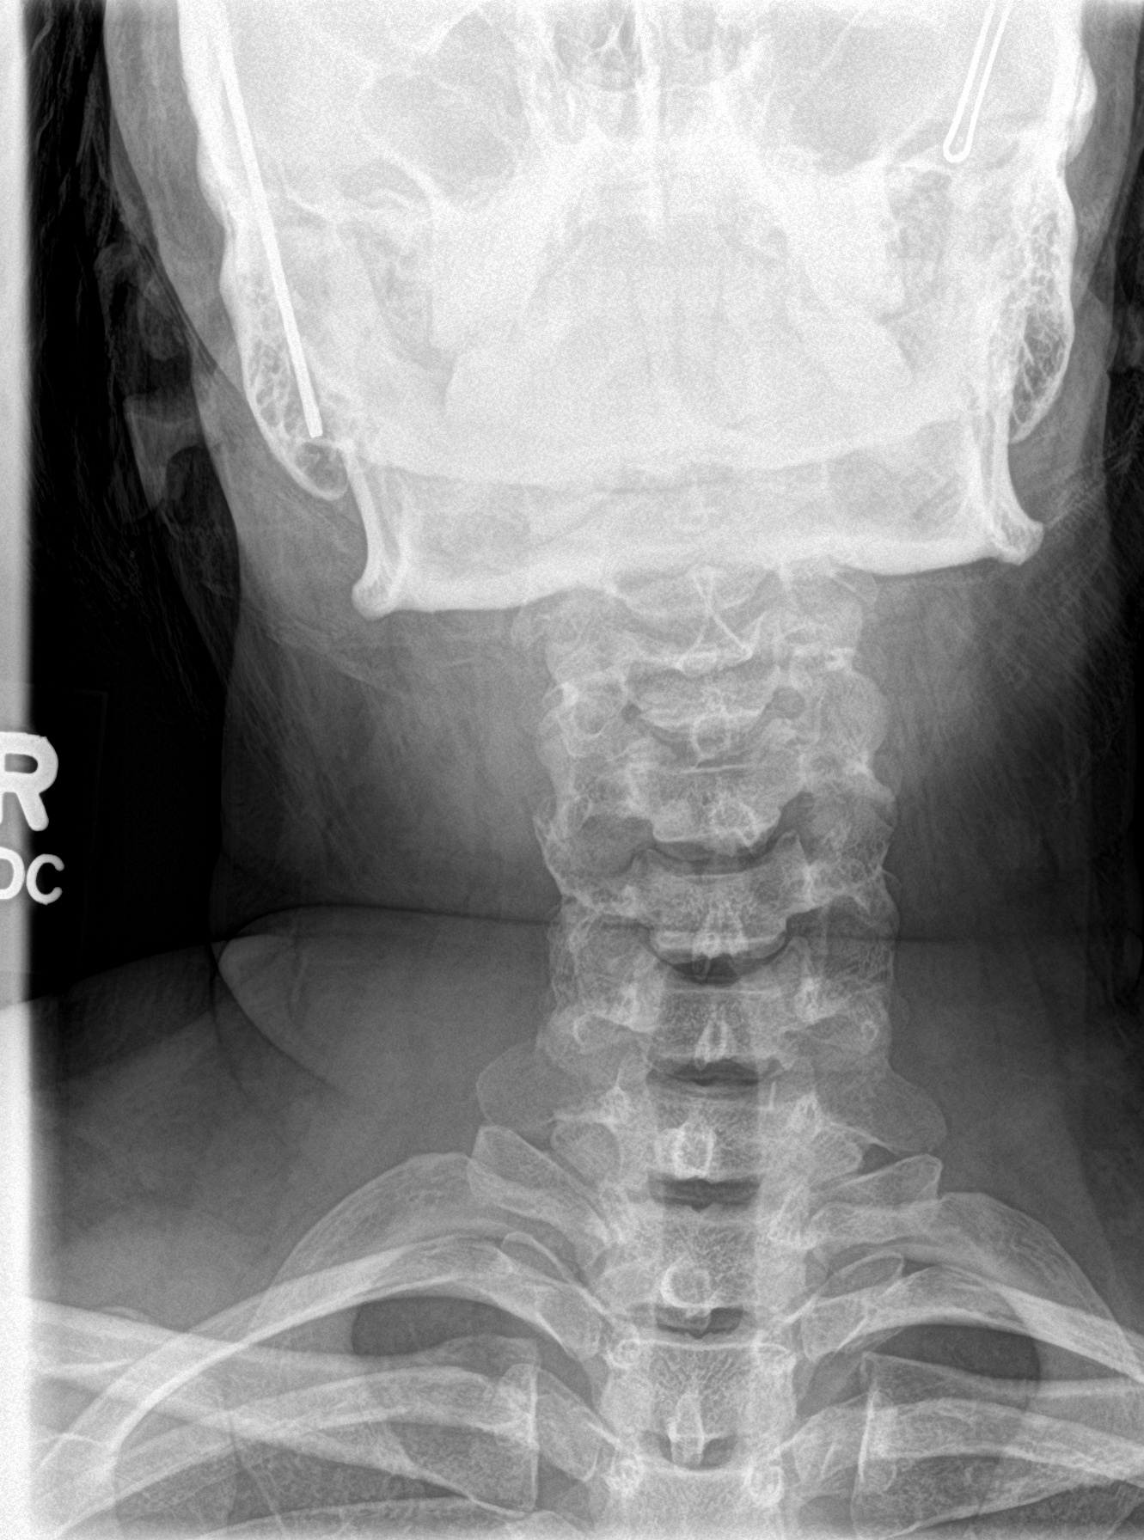
[im 3/3]
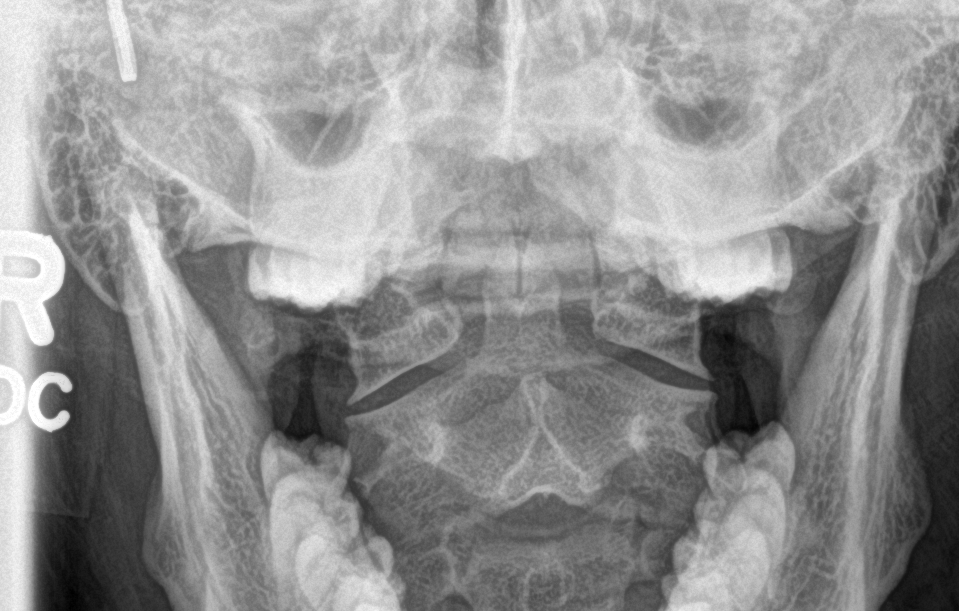

[3 of 3 positions shown; findings below may reference images not displayed]

FINDINGS: Reversal of cervical lordosis. Vertebral body heights and disc
spaces are normal. Normal prevertebral soft tissue thickness. Dens
and lateral masses are within normal limits.
IMPRESSION: Reversal of cervical lordosis.  Otherwise negative

## 2021-01-02 IMAGING — CR DG THORACIC SPINE 2V
1 series · 4 of 4 positions shown · non-contrast
Comparison: None.

CLINICAL DATA: Extreme pain history of fall

EXAM:
THORACIC SPINE 2 VIEWS

[Series 1: dg thoracic spine 2 view · 0.14mm/px · 4 of 4 slices shown]
[im 1/4]
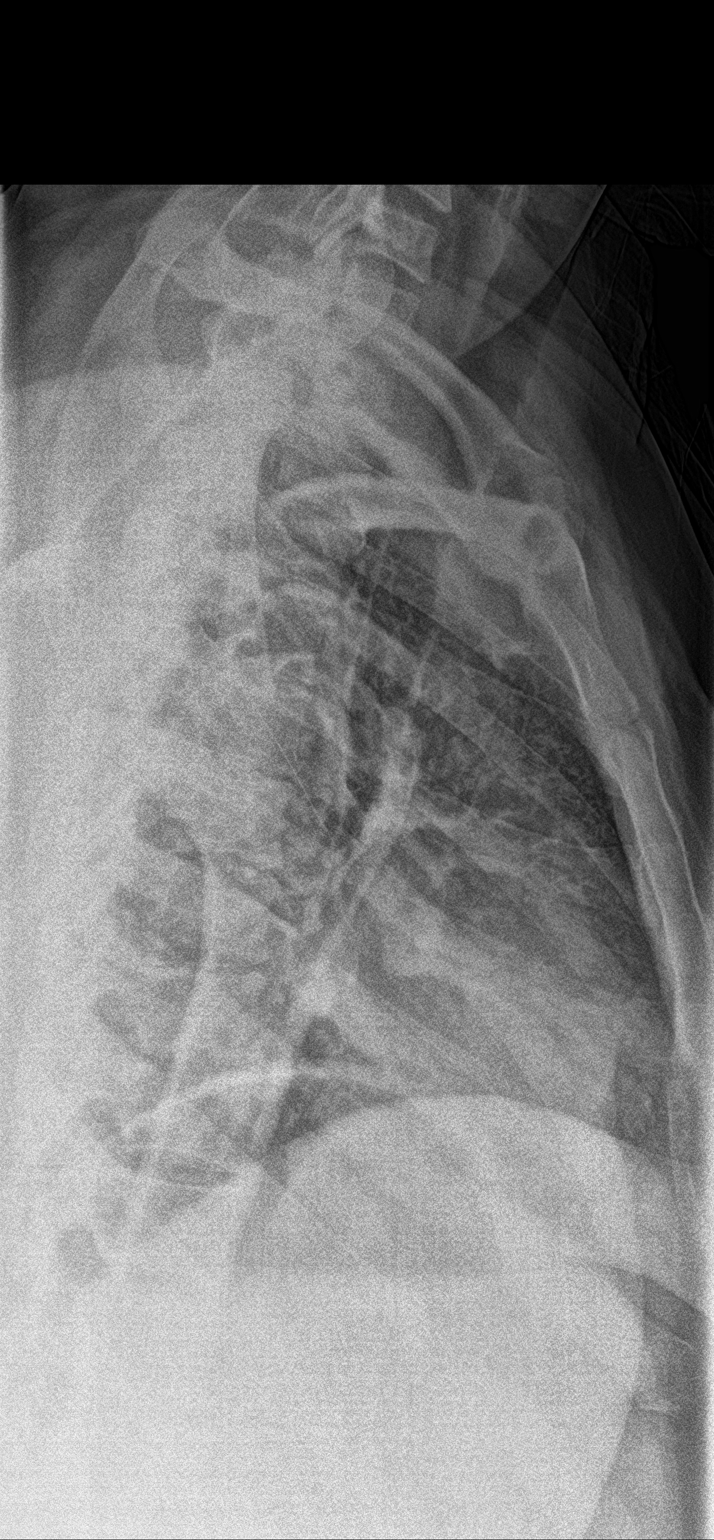
[im 2/4]
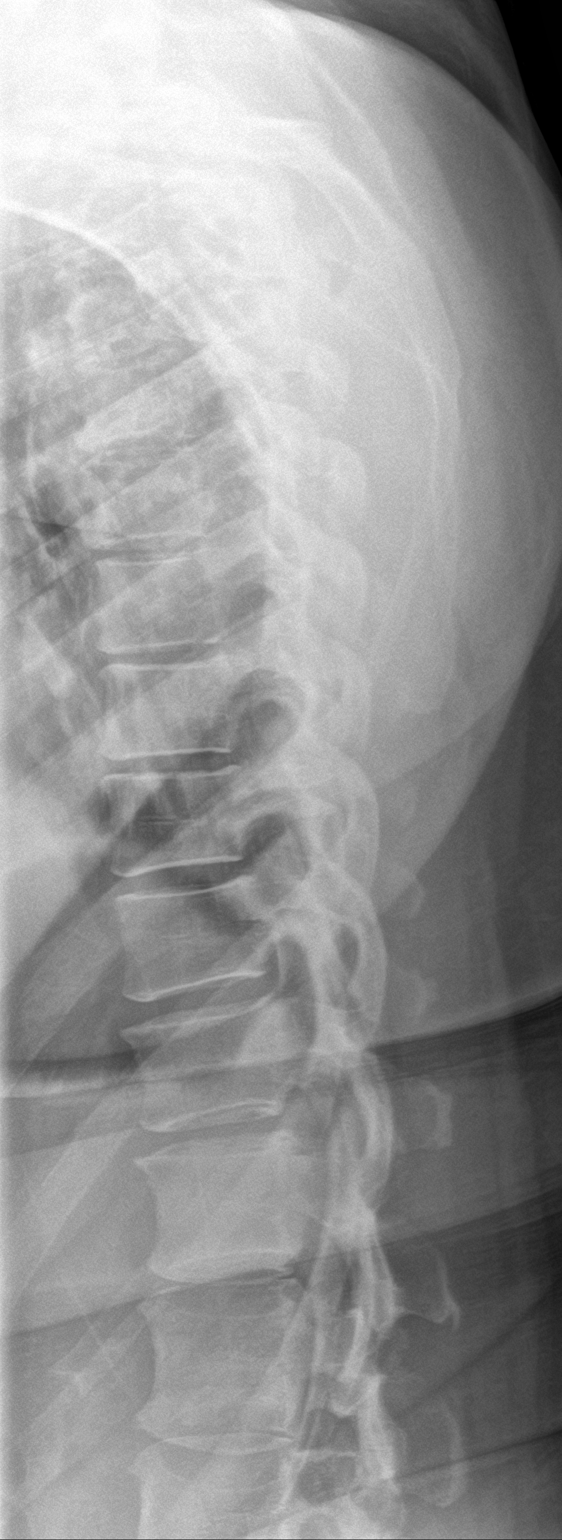
[im 3/4]
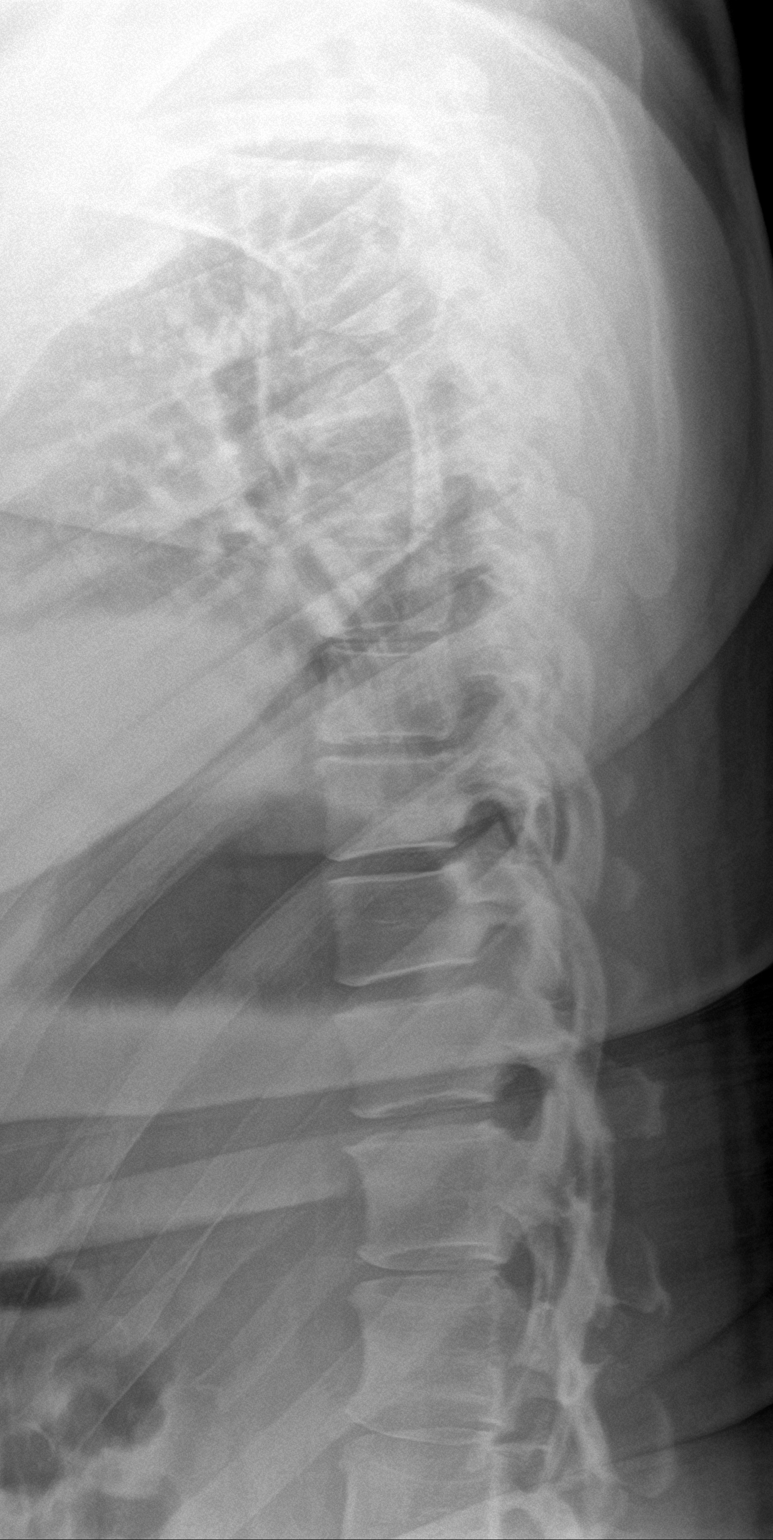
[im 4/4]
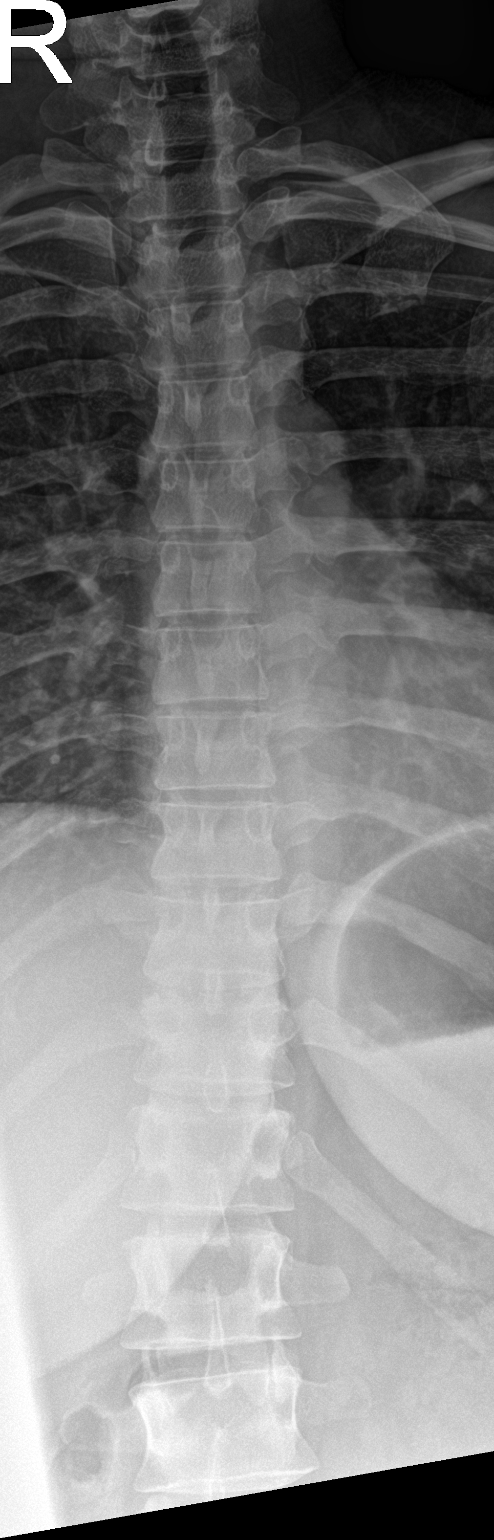

[4 of 4 positions shown; findings below may reference images not displayed]

FINDINGS: There is no evidence of thoracic spine fracture. Alignment is
normal. No other significant bone abnormalities are identified.
Minimal degenerative osteophytes.
IMPRESSION: Negative.

## 2021-01-02 MED ORDER — TRAMADOL HCL 50 MG PO TABS
50.0000 mg | ORAL_TABLET | Freq: Once | ORAL | Status: AC
  Administered 2021-01-02: 50 mg via ORAL
  Filled 2021-01-02: qty 1

## 2021-01-02 MED ORDER — TRAMADOL HCL 50 MG PO TABS: 50.0000 mg | ORAL_TABLET | Freq: Four times a day (QID) | ORAL | 0 refills | Status: DC | PRN

## 2021-01-02 NOTE — ED Notes (Signed)
See triage note  States she slipped and fell face first on Saturday  Landed on knees and right hand States pain has increased   Presents with brace on right wrist  Min swelling noted to right knee

## 2021-01-02 NOTE — ED Triage Notes (Addendum)
Pt to triage via wheelchair.  Pt fell 3 days ago at work and was seen in here in the ER.  Pt reports she is worse with pain all over.  Pt has a brace on right wrist.  Pt reports neck and back pain and right hip pain. Pt states she is here for a recheck.   Pt alert  speech clear.

## 2021-01-02 NOTE — Discharge Instructions (Addendum)
Please continue with anti-inflammatory medication and muscle relaxer prescribed last visit.  He may add tramadol for additional pain relief.  Follow-up with orthopedics.  You may use heating pad to the cervical and thoracic back muscles.  Work on gentle stretching exercises and try to walk to help with pain and tightness

## 2021-01-02 NOTE — ED Provider Notes (Signed)
[facility] EMERGENCY DEPARTMENT Provider Note   CSN: [id number] Arrival date & time: 01/02/21  1511     History Chief Complaint  Patient presents with   Teresa Meza is a 40 y.o. female presents to the emergency department evaluation of a Worker's Comp. injury that occurred on 12/30/2020.  Patient slipped on butter and injured her right side of her body.  She had x-rays of her right hip, wrist, lumbar spine and knee on 12/30/2020.  No evidence of acute bony abnormality.  She is placed on a muscle relaxer and anti-inflammatory medication has not seen laterally.  She is currently getting approval from [employer]. to see orthopedics.  She has been unable to work.  She has presented back to the emergency department for evaluation of cervical and thoracic back pain she describes as muscle tightness, spasms with no numbness tingling radicular symptoms.  She is not getting much relief with meloxicam and baclofen.  Patient states she is unable to work and is requesting extension out before.  HPI     Past Medical History:  Diagnosis Date   Ectopic fetus    Elderly multigravida 08/26/2017   [ ]  NIPT [ ]  NST/AFI weekly starting at 36 weeks   Elevated blood pressure reading without diagnosis of hypertension 02/10/2018    Patient Active Problem List   Diagnosis Date Noted   Encounter for induction of labor 02/14/2018   Abdominal wall cellulitis 02/11/2018   Abdominal pain 02/10/2018   Anemia 02/10/2018   Preeclampsia, third trimester 02/10/2018   Indication for care in labor and delivery, antepartum 01/24/2018   Pregnancy 01/24/2018   Cough 11/07/2017   Labor and delivery indication for care or intervention 10/24/2017   Advanced maternal age in multigravida, unspecified trimester 10/23/2017   Gestational diabetes mellitus (GDM) affecting fourth pregnancy 10/14/2017   BMI 37.0-37.9, adult 10/08/2017   Obesity in pregnancy 10/08/2017   Supervision of  high risk pregnancy, antepartum 08/26/2017   Hx of gestational diabetes in prior pregnancy, currently pregnant 08/26/2017    Past Surgical History:  Procedure Laterality Date   ECTOPIC PREGNANCY SURGERY       OB History     Gravida  4   Para  3   Term  3   Preterm      AB  1   Living  3      SAB      IAB      Ectopic  1   Multiple  0   Live Births  3           Family History  Problem Relation Age of Onset   Diabetes Mellitus II Mother     Social History   Tobacco Use   Smoking status: Never   Smokeless tobacco: Never  Substance Use Topics   Alcohol use: No   Drug use: No    Home Medications Prior to Admission medications   Medication Sig Start Date End Date Taking? Authorizing Provider  traMADol (ULTRAM) 50 MG tablet Take 1 tablet (50 mg total) by mouth every 6 (six) hours as needed. 01/02/21 01/02/22 Yes 01/04/21, PA-C  baclofen (LIORESAL) 10 MG tablet Take 1 tablet (10 mg total) by mouth 3 (three) times daily for 7 days. 12/30/20 01/06/21  Fisher, 03/01/21, PA-C  meloxicam (MOBIC) 15 MG tablet Take 1 tablet (15 mg total) by mouth daily. 12/30/20 12/30/21  03/01/21, PA-C    Allergies  Sulfa antibiotics  Review of Systems   Review of Systems  Constitutional:  Negative for activity change, chills, fatigue and fever.  Eyes:  Negative for pain and visual disturbance.  Respiratory:  Negative for shortness of breath.   Cardiovascular:  Negative for chest pain and leg swelling.  Gastrointestinal:  Negative for abdominal pain.  Genitourinary:  Negative for flank pain and pelvic pain.  Musculoskeletal:  Negative for arthralgias, gait problem, joint swelling, myalgias, neck pain and neck stiffness.  Skin:  Negative for wound.  Neurological:  Negative for dizziness, syncope, weakness, light-headedness, numbness and headaches.  Psychiatric/Behavioral:  Negative for confusion and decreased concentration.    Physical Exam Updated Vital  Signs BP (!) 128/92   Pulse 85   Temp 99.4 F (37.4 C) (Oral)   Resp 18   Ht 5\' 3"  (1.6 m)   Wt 90.7 kg   SpO2 97%   BMI 35.43 kg/m   Physical Exam Constitutional:      Appearance: She is well-developed.  HENT:     Head: Normocephalic and atraumatic.     Right Ear: External ear normal.     Left Ear: External ear normal.     Nose: Nose normal.  Eyes:     Conjunctiva/sclera: Conjunctivae normal.  Cardiovascular:     Rate and Rhythm: Normal rate.  Pulmonary:     Effort: Pulmonary effort is normal. No respiratory distress.     Breath sounds: Normal breath sounds.  Abdominal:     Palpations: Abdomen is soft.     Tenderness: There is no abdominal tenderness.  Musculoskeletal:        General: No deformity.     Cervical back: Normal range of motion.     Comments: Examination of the cervical and thoracic and lumbar spine shows some mild spinous process tenderness along the lower cervical and upper thoracic spine.  She has a lot of left and right paravertebral muscle tenderness along the trapezius and medial scapular borders bilaterally.  Neuro vas intact in upper extremities.  She has a Velcro wrist on the right upper extremity.  Skin:    General: Skin is warm and dry.     Findings: No rash.  Neurological:     General: No focal deficit present.     Mental Status: She is alert and oriented to person, place, and time.     Cranial Nerves: No cranial nerve deficit.     Coordination: Coordination normal.  Psychiatric:        Behavior: Behavior normal.        Thought Content: Thought content normal.    ED Results / Procedures / Treatments   Labs (all labs ordered are listed, but only abnormal results are displayed) Labs Reviewed - No data to display  EKG None  Radiology DG Cervical Spine 2-3 Views  Result Date: 01/02/2021 CLINICAL DATA:  Pain after fall EXAM: CERVICAL SPINE - 2-3 VIEW COMPARISON:  None. FINDINGS: Reversal of cervical lordosis. Vertebral body heights and  disc spaces are normal. Normal prevertebral soft tissue thickness. Dens and lateral masses are within normal limits. IMPRESSION: Reversal of cervical lordosis.  Otherwise negative Electronically Signed   By: 01/04/2021 M.D.   On: 01/02/2021 17:44   DG Thoracic Spine 2 View  Result Date: 01/02/2021 CLINICAL DATA:  Extreme pain history of fall EXAM: THORACIC SPINE 2 VIEWS COMPARISON:  None. FINDINGS: There is no evidence of thoracic spine fracture. Alignment is normal. No other significant bone abnormalities are identified. Minimal  degenerative osteophytes. IMPRESSION: Negative. Electronically Signed   By: Jasmine Pang M.D.   On: 01/02/2021 17:43    Procedures Procedures   Medications Ordered in ED Medications  traMADol (ULTRAM) tablet 50 mg (50 mg Oral Given 01/02/21 1645)    ED Course  I have reviewed the triage vital signs and the nursing notes.  Pertinent labs & imaging results that were available during my care of the patient were reviewed by me and considered in my medical decision making (see chart for details).    MDM Rules/Calculators/A&P                          40 year old female with Worker's Comp. injury several days ago.  Initial work-up negative with x-rays of the right shoulder, right wrist, right knee and lumbar spine.  Today coming in complaining of of neck tightness and thoracic paravertebral muscle tightness.  X-rays negative of the cervical and thoracic spine.  Imaging consistent with cervical muscle spasms.  Vital signs are stable.  No weakness or neurological deficits in upper or lower extremities.  She is given a prescription of tramadol to continue with baclofen and meloxicam.  She will follow with orthopedics.  She is given a note to remain out of work until 01/08/2021 Final Clinical Impression(s) / ED Diagnoses Final diagnoses:  Cervical muscle strain, initial encounter  Thoracic myofascial strain, initial encounter    Rx / DC Orders ED Discharge Orders           Ordered    traMADol (ULTRAM) 50 MG tablet  Every 6 hours PRN        01/02/21 1819             Ronnette Juniper 01/02/21 1820    Phineas Semen, MD 01/02/21 [phone]

## 2021-01-09 ENCOUNTER — Emergency Department: Payer: Worker's Compensation

## 2021-01-09 ENCOUNTER — Emergency Department
Admission: EM | Admit: 2021-01-09 | Discharge: 2021-01-09 | Disposition: A | Discharge status: Home / Self Care | Payer: Worker's Compensation | Attending: Student in an Organized Health Care Education/Training Program | Admitting: Student in an Organized Health Care Education/Training Program

## 2021-01-09 ENCOUNTER — Other Ambulatory Visit: Payer: Self-pay

## 2021-01-09 DIAGNOSIS — Z882 Allergy status to sulfonamides status: Secondary | ICD-10-CM | POA: Insufficient documentation

## 2021-01-09 DIAGNOSIS — W19XXXD Unspecified fall, subsequent encounter: Secondary | ICD-10-CM

## 2021-01-09 DIAGNOSIS — S63501D Unspecified sprain of right wrist, subsequent encounter: Secondary | ICD-10-CM | POA: Insufficient documentation

## 2021-01-09 DIAGNOSIS — M545 Low back pain, unspecified: Secondary | ICD-10-CM | POA: Diagnosis not present

## 2021-01-09 DIAGNOSIS — Z791 Long term (current) use of non-steroidal anti-inflammatories (NSAID): Secondary | ICD-10-CM | POA: Insufficient documentation

## 2021-01-09 DIAGNOSIS — Y990 Civilian activity done for income or pay: Secondary | ICD-10-CM | POA: Diagnosis not present

## 2021-01-09 DIAGNOSIS — M25551 Pain in right hip: Secondary | ICD-10-CM | POA: Diagnosis not present

## 2021-01-09 DIAGNOSIS — M7918 Myalgia, other site: Secondary | ICD-10-CM

## 2021-01-09 DIAGNOSIS — M542 Cervicalgia: Secondary | ICD-10-CM | POA: Diagnosis not present

## 2021-01-09 DIAGNOSIS — W010XXD Fall on same level from slipping, tripping and stumbling without subsequent striking against object, subsequent encounter: Secondary | ICD-10-CM | POA: Insufficient documentation

## 2021-01-09 DIAGNOSIS — S6991XD Unspecified injury of right wrist, hand and finger(s), subsequent encounter: Secondary | ICD-10-CM | POA: Diagnosis present

## 2021-01-09 IMAGING — CT CT HIP*R* W/O CM
2 of 3 series · 18 of 46 positions shown, 20 images · non-contrast
Comparison: None.

CLINICAL DATA: Hip pain, stress fracture suspected, neg xray

EXAM:
CT OF THE RIGHT HIP WITHOUT CONTRAST
TECHNIQUE: Multidetector CT imaging of the right hip was performed according to
the standard protocol. Multiplanar CT image reconstructions were
also generated.

[Series 3: axial st · axial · 0.43mm/px · z∈[-260,-96]mm · 15 of 91 slices shown, 17 images]
[im 6/91  soft-tissue]
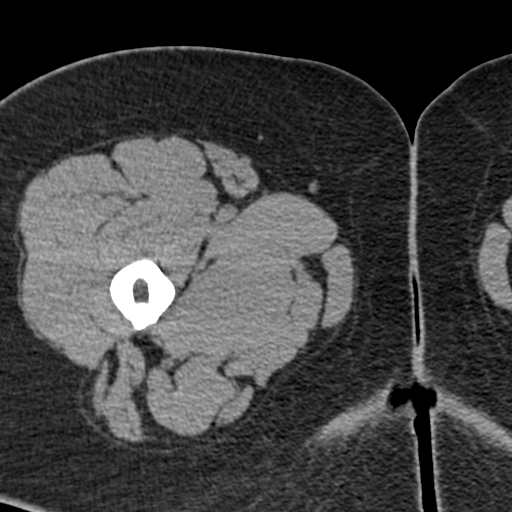
[im 6/91  bone]
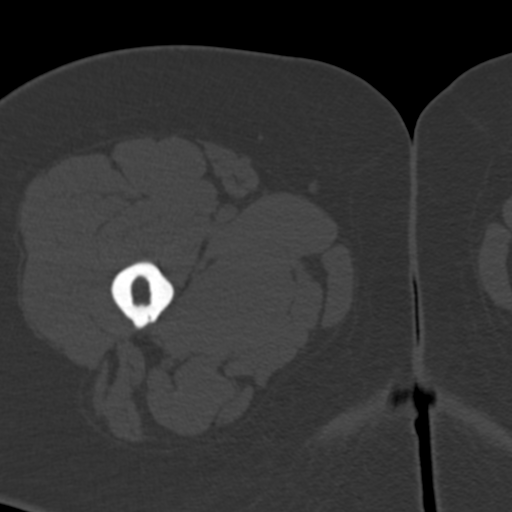
[im 12/91  soft-tissue]
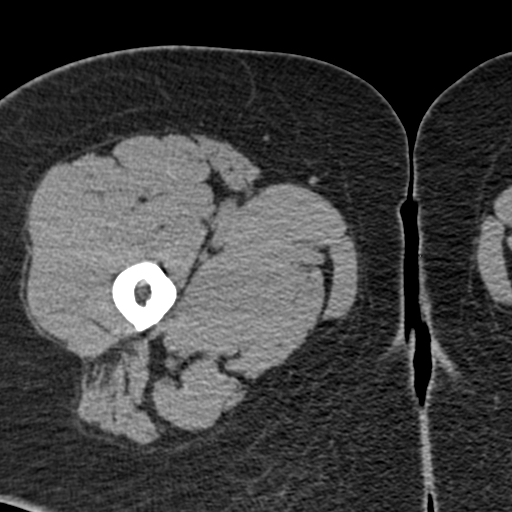
[im 18/91  soft-tissue]
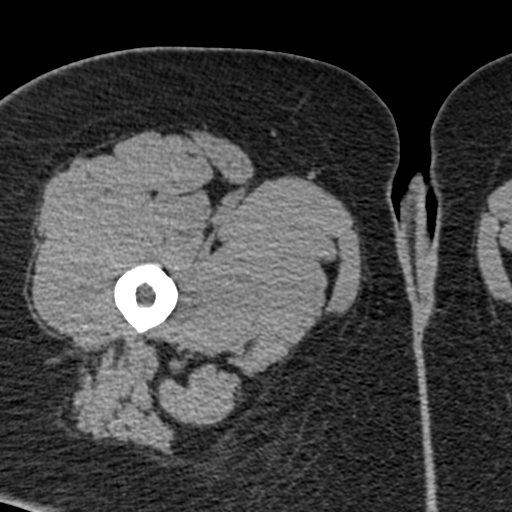
[im 24/91  soft-tissue]
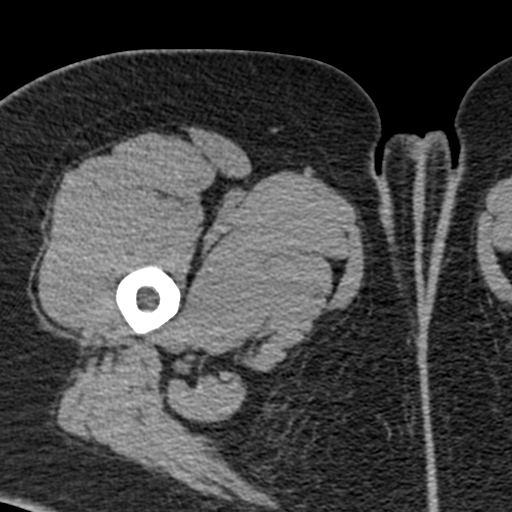
[im 30/91  soft-tissue]
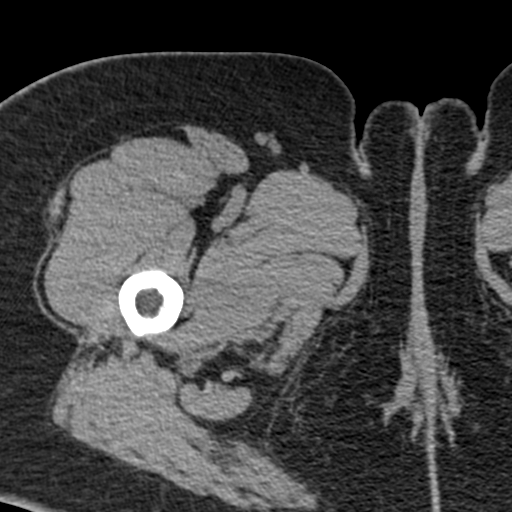
[im 35/91  soft-tissue]
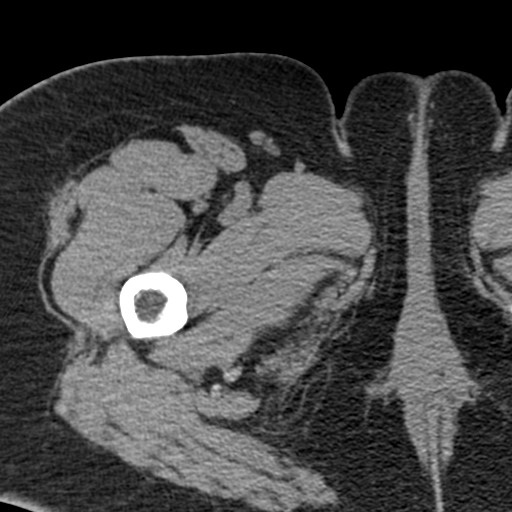
[im 41/91  soft-tissue]
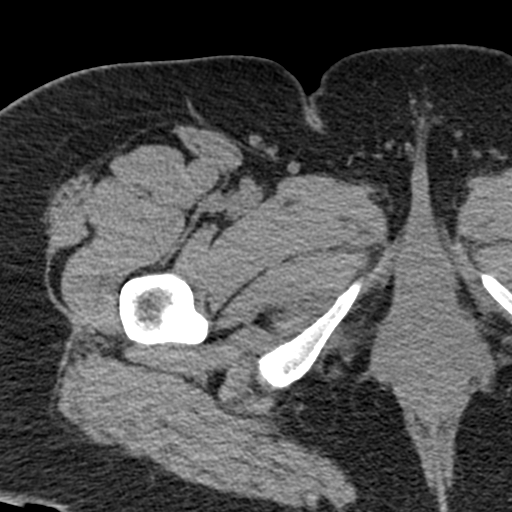
[im 47/91  soft-tissue]
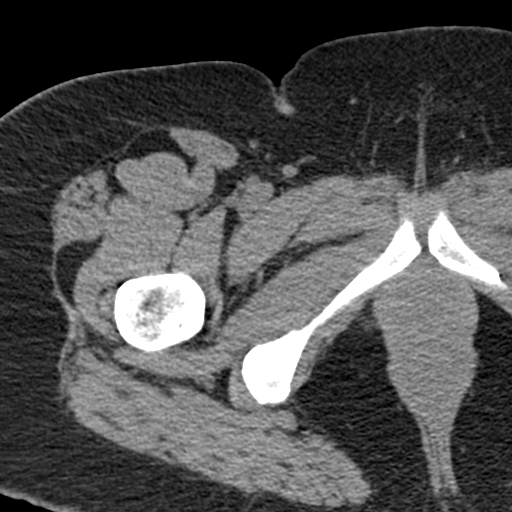
[im 53/91  soft-tissue]
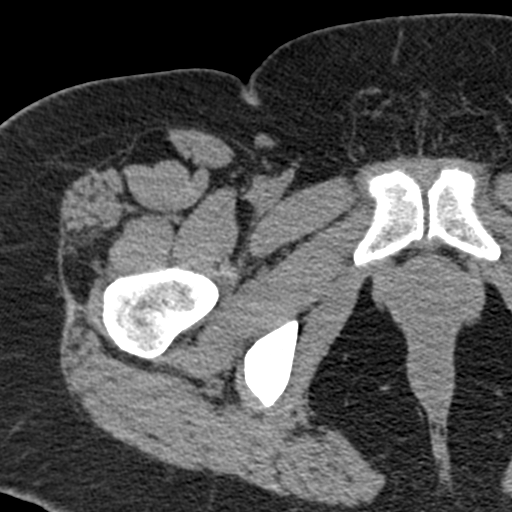
[im 53/91  bone]
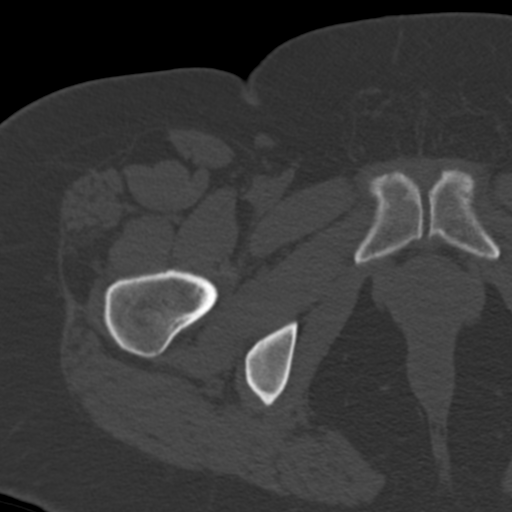
[im 59/91  soft-tissue]
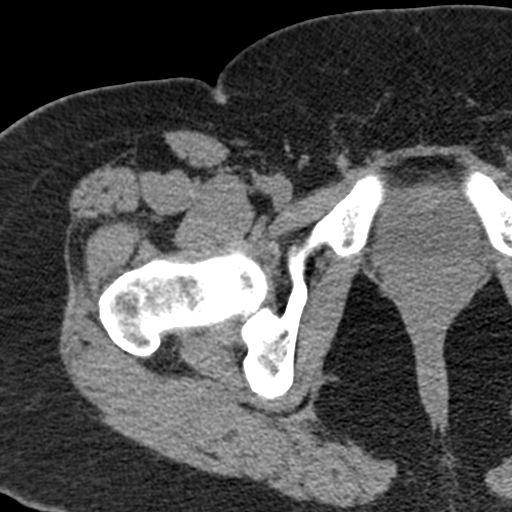
[im 64/91  soft-tissue]
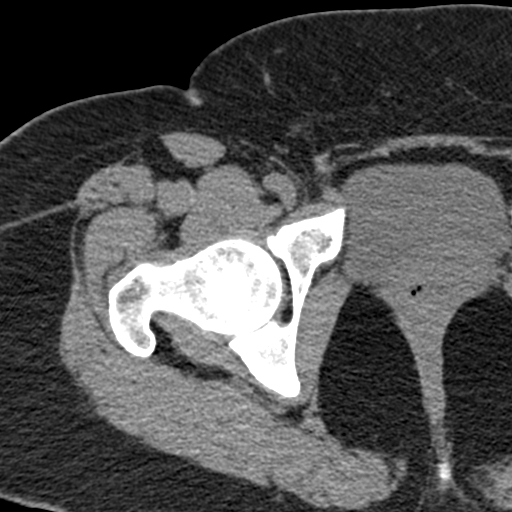
[im 70/91  soft-tissue]
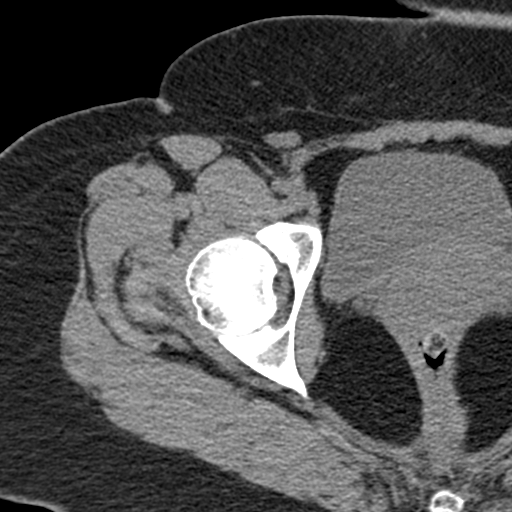
[im 76/91  soft-tissue]
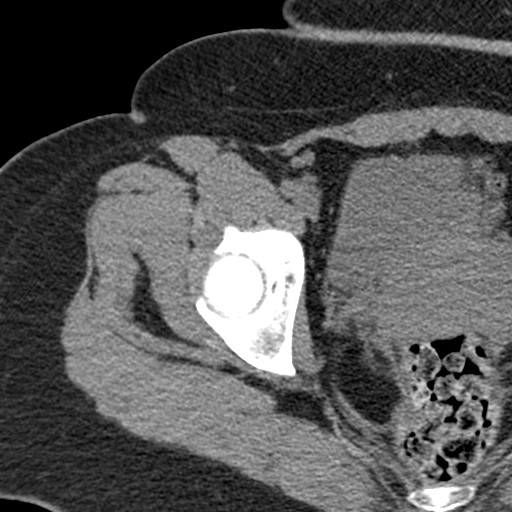
[im 82/91  soft-tissue]
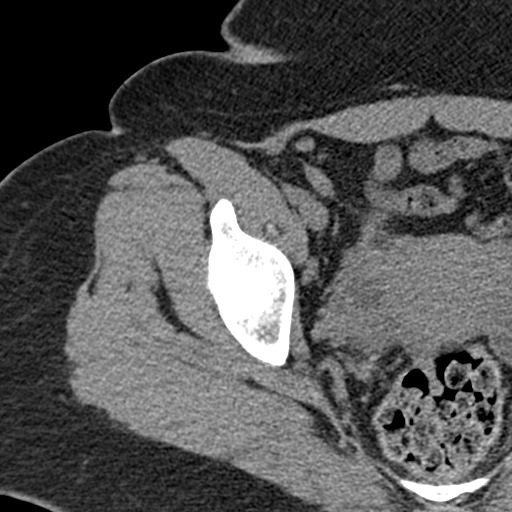
[im 88/91  soft-tissue]
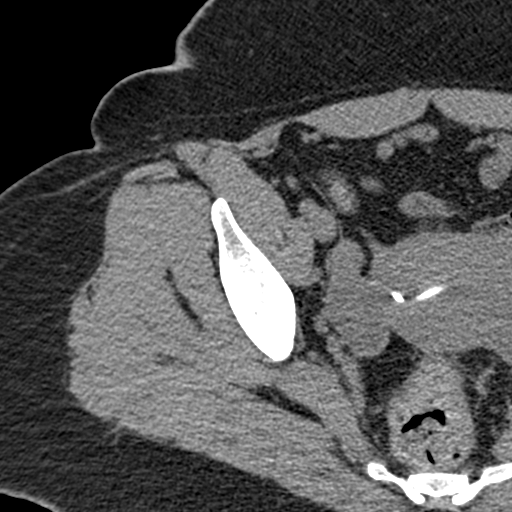

[Series 7: coronal st · coronal · 0.42mm/px · 3 of 93 slices shown]
[im 31/93  soft-tissue]
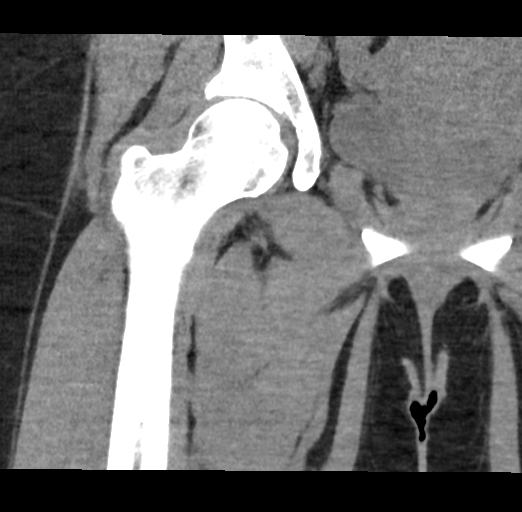
[im 41/93  soft-tissue]
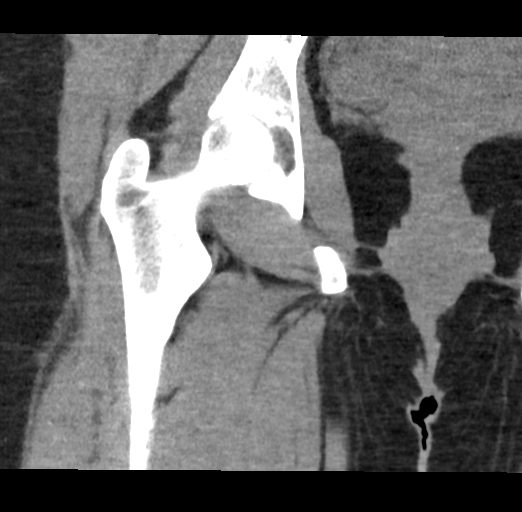
[im 52/93  soft-tissue]
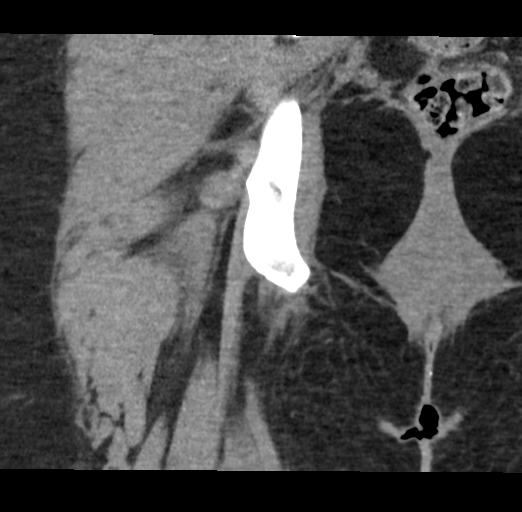

[18 of 46 positions shown; findings below may reference images not displayed]

FINDINGS: Bones/Joint/Cartilage

There is no evidence of acute right hip fracture. There is mild
right hip degenerative change.

Ligaments

Suboptimally assessed by CT.

Muscles and Tendons

There is no muscle atrophy.

Soft tissues

There is no focal fluid collection. There is no significant soft
tissue edema. There is an IUD in place.
IMPRESSION: No evidence of acute fracture.  Mild right hip degenerative change.

## 2021-01-09 MED ORDER — OXYCODONE HCL 5 MG PO TABS
5.0000 mg | ORAL_TABLET | Freq: Once | ORAL | Status: AC
  Administered 2021-01-09: 5 mg via ORAL
  Filled 2021-01-09: qty 1

## 2021-01-09 MED ORDER — HYDROCODONE-ACETAMINOPHEN 5-325 MG PO TABS: 1.0000 | ORAL_TABLET | Freq: Four times a day (QID) | ORAL | 0 refills | Status: AC | PRN

## 2021-01-09 MED ORDER — KETOROLAC TROMETHAMINE 60 MG/2ML IM SOLN
30.0000 mg | Freq: Once | INTRAMUSCULAR | Status: AC
  Administered 2021-01-09: 30 mg via INTRAMUSCULAR
  Filled 2021-01-09: qty 2

## 2021-01-09 MED ORDER — TIZANIDINE HCL 4 MG PO TABS: 4.0000 mg | ORAL_TABLET | Freq: Three times a day (TID) | ORAL | 0 refills | Status: DC

## 2021-01-09 NOTE — Discharge Instructions (Addendum)
Please follow up with orthopedics if not improving over the next few days.

## 2021-01-09 NOTE — ED Notes (Signed)
First Nurse Note: Pt c/o pain and swelling in the entire right side of her body.

## 2021-01-09 NOTE — ED Triage Notes (Signed)
Pt states she fell 6/11 and has been having pain to the whole right side of her body and back , states she was seen here and continues to have the pain and swelling

## 2021-01-09 NOTE — ED Notes (Signed)
See triage note  Presents with cont'd pain s/p fall 2 weeks ago  States she fell on to right side  States she is now having swelling to lower back and hip area  No swelling noted at present  Denies any new or recent fall

## 2021-01-09 NOTE — ED Provider Notes (Signed)
[facility] Emergency Department Provider Note ____________________________________________  Time seen: Approximately 6:25 PM  I have reviewed the triage vital signs and the nursing notes.   HISTORY  Chief Complaint Fall    HPI YARESLY MENZEL is a 40 y.o. female who presents to the emergency department for evaluation and treatment of neck, back, right hand, right hip, and back pain after work injury. She slipped in butter that had leaked out of the container and landed on her right side. Initial visit here was on 6/11. She was evaluated a second time on the 14th and is back today due to not improving. She is taking medications as prescribed and wearing her wrist brace.   Past Medical History:  Diagnosis Date   Ectopic fetus    Elderly multigravida 08/26/2017   [ ]  NIPT [ ]  NST/AFI weekly starting at 36 weeks   Elevated blood pressure reading without diagnosis of hypertension 02/10/2018    Patient Active Problem List   Diagnosis Date Noted   Encounter for induction of labor 02/14/2018   Abdominal wall cellulitis 02/11/2018   Abdominal pain 02/10/2018   Anemia 02/10/2018   Preeclampsia, third trimester 02/10/2018   Indication for care in labor and delivery, antepartum 01/24/2018   Pregnancy 01/24/2018   Cough 11/07/2017   Labor and delivery indication for care or intervention 10/24/2017   Advanced maternal age in multigravida, unspecified trimester 10/23/2017   Gestational diabetes mellitus (GDM) affecting fourth pregnancy 10/14/2017   BMI 37.0-37.9, adult 10/08/2017   Obesity in pregnancy 10/08/2017   Supervision of high risk pregnancy, antepartum 08/26/2017   Hx of gestational diabetes in prior pregnancy, currently pregnant 08/26/2017    Past Surgical History:  Procedure Laterality Date   ECTOPIC PREGNANCY SURGERY      Prior to Admission medications   Medication Sig Start Date End Date Taking? Authorizing Provider  HYDROcodone-acetaminophen  (NORCO/VICODIN) 5-325 MG tablet Take 1 tablet by mouth every 6 (six) hours as needed for up to 3 days for severe pain. 01/09/21 01/12/21 Yes Goro Wenrick B, FNP  tiZANidine (ZANAFLEX) 4 MG tablet Take 1 tablet (4 mg total) by mouth 3 (three) times daily. 01/09/21  Yes Yasha Tibbett B, FNP  meloxicam (MOBIC) 15 MG tablet Take 1 tablet (15 mg total) by mouth daily. 12/30/20 12/30/21  Fisher, 03/01/21, PA-C    Allergies Sulfa antibiotics  Family History  Problem Relation Age of Onset   Diabetes Mellitus II Mother     Social History Social History   Tobacco Use   Smoking status: Never   Smokeless tobacco: Never  Substance Use Topics   Alcohol use: No   Drug use: No    Review of Systems Constitutional: Negative for fever. Cardiovascular: Negative for chest pain. Respiratory: Negative for shortness of breath. Musculoskeletal: Positive for neck, back, and right hand/wrist, and right hip pain Skin: Negative for open wounds or lesions on exposed skin Neurological: Negative for decrease in sensation  ____________________________________________   PHYSICAL EXAM:  VITAL SIGNS: ED Triage Vitals  Enc Vitals Group     BP 01/09/21 1258 119/88     Pulse Rate 01/09/21 1258 95     Resp 01/09/21 1258 16     Temp 01/09/21 1258 98.7 F (37.1 C)     Temp Source 01/09/21 1258 Oral     SpO2 01/09/21 1258 97 %     Weight 01/09/21 1259 200 lb (90.7 kg)     Height 01/09/21 1259 5\' 3"  (1.6 m)  Head Circumference --      Peak Flow --      Pain Score 01/09/21 1259 10     Pain Loc --      Pain Edu? --      Excl. in GC? --     Constitutional: Alert and oriented. Well appearing and in no acute distress. Eyes: Conjunctivae are clear without discharge or drainage Head: Atraumatic Neck: Diffuse tenderness without focal midline pain. Respiratory: No cough. Respirations are even and unlabored. Musculoskeletal: Diffuse neck tenderness, upper and lower back tenderness, right wrist pain  specifically over thenar eminence Neurologic: Awake, alert, oriented x4. Skin: No open wounds or lesions noted on exposed skin. Psychiatric: Affect and behavior are appropriate.  ____________________________________________   LABS (all labs ordered are listed, but only abnormal results are displayed)  Labs Reviewed - No data to display ____________________________________________  RADIOLOGY  No indication of scaphoid injury find follow-up imaging of the right hand.  No indication of hip fracture or other acute concerns on CT exam of the right hip.  I, Kem Boroughs, personally viewed and evaluated these images (plain radiographs) as part of my medical decision making, as well as reviewing the written report by the radiologist.  No results found. ____________________________________________   PROCEDURES  Procedures  ____________________________________________   INITIAL IMPRESSION / ASSESSMENT AND PLAN / ED COURSE  CHERESA SIERS is a 40 y.o. who presents to the emergency department for treatment and evaluation of pain that has continued since her fall several weeks ago.  See HPI for further details.  Imaging is negative for acute concerns.  She will be encouraged to continue using her wrist brace, ice, elevate areas of pain and take medications as prescribed.  She was encouraged to contact her Worker's Compensation group for advice on orthopedic follow-up.  Patient instructed to follow-up with orthopedics as needed.  She was also instructed to return to the emergency department for symptoms that change or worsen if unable schedule an appointment with orthopedics or primary care.  Medications  ketorolac (TORADOL) injection 30 mg (30 mg Intramuscular Given 01/09/21 1501)  oxyCODONE (Oxy IR/ROXICODONE) immediate release tablet 5 mg (5 mg Oral Given 01/09/21 1500)    Pertinent labs & imaging results that were available during my care of the patient were reviewed by me and  considered in my medical decision making (see chart for details).   _________________________________________   FINAL CLINICAL IMPRESSION(S) / ED DIAGNOSES  Final diagnoses:  Fall, subsequent encounter  Wrist sprain, right, subsequent encounter  Hip pain, acute, right  Musculoskeletal pain    ED Discharge Orders          Ordered    HYDROcodone-acetaminophen (NORCO/VICODIN) 5-325 MG tablet  Every 6 hours PRN        01/09/21 1606    tiZANidine (ZANAFLEX) 4 MG tablet  3 times daily        01/09/21 1606             If controlled substance prescribed during this visit, 12 month history viewed on the [facility] prior to issuing an initial prescription for Schedule II or III opiod.    Chinita Pester, FNP 01/11/21 1120    Willy Eddy, MD 01/13/21 [phone]

## 2021-03-18 ENCOUNTER — Emergency Department: Payer: Medicaid Other | Attending: Emergency Medicine

## 2021-03-18 ENCOUNTER — Emergency Department
Admission: EM | Admit: 2021-03-18 | Discharge: 2021-03-18 | Disposition: A | Discharge status: Home / Self Care | Payer: Worker's Compensation | Attending: Emergency Medicine | Admitting: Emergency Medicine

## 2021-03-18 ENCOUNTER — Other Ambulatory Visit: Payer: Self-pay

## 2021-03-18 DIAGNOSIS — R42 Dizziness and giddiness: Secondary | ICD-10-CM | POA: Insufficient documentation

## 2021-03-18 DIAGNOSIS — R519 Headache, unspecified: Secondary | ICD-10-CM | POA: Diagnosis not present

## 2021-03-18 DIAGNOSIS — M5441 Lumbago with sciatica, right side: Secondary | ICD-10-CM | POA: Diagnosis not present

## 2021-03-18 DIAGNOSIS — M545 Low back pain, unspecified: Secondary | ICD-10-CM | POA: Diagnosis present

## 2021-03-18 LAB — BASIC METABOLIC PANEL
Anion gap: 7 (ref 5–15)
BUN: 10 mg/dL (ref 6–20)
CO2: 25 mmol/L (ref 22–32)
Calcium: 8.9 mg/dL (ref 8.9–10.3)
Chloride: 102 mmol/L (ref 98–111)
Creatinine, Ser: 0.93 mg/dL (ref 0.44–1.00)
GFR, Estimated: >60 mL/min (ref >60)
Glucose, Bld: 148 mg/dL — ABNORMAL HIGH (ref 70–99)
Potassium: 4 mmol/L (ref 3.5–5.1)
Sodium: 134 mmol/L — ABNORMAL LOW (ref 135–145)

## 2021-03-18 LAB — URINALYSIS, COMPLETE (UACMP) WITH MICROSCOPIC
Bilirubin Urine: NEGATIVE
Glucose, UA: NEGATIVE mg/dL
Ketones, ur: NEGATIVE mg/dL
Leukocytes,Ua: NEGATIVE
Nitrite: NEGATIVE
Protein, ur: NEGATIVE mg/dL
Specific Gravity, Urine: 1.021 (ref 1.005–1.030)
pH: 5 (ref 5.0–8.0)

## 2021-03-18 LAB — CBC
HCT: 43.7 % (ref 36.0–46.0)
Hemoglobin: 14.5 g/dL (ref 12.0–15.0)
MCH: 28.5 pg (ref 26.0–34.0)
MCHC: 33.2 g/dL (ref 30.0–36.0)
MCV: 85.9 fL (ref 80.0–100.0)
Platelets: 300 10*3/uL (ref 150–400)
RBC: 5.09 MIL/uL (ref 3.87–5.11)
RDW: 12.5 % (ref 11.5–15.5)
WBC: 5.1 10*3/uL (ref 4.0–10.5)
nRBC: 0 % (ref 0.0–0.2)

## 2021-03-18 LAB — POC URINE PREG, ED: Preg Test, Ur: NEGATIVE

## 2021-03-18 LAB — TROPONIN I (HIGH SENSITIVITY): Troponin I (High Sensitivity): <2 ng/L (ref <18)

## 2021-03-18 IMAGING — MR MR LUMBAR SPINE W/O CM
5 series · 32 of 48 positions shown · non-contrast
Comparison: Lumbar spine x-rays dated [DATE].

CLINICAL DATA: Progressively worsening low back pain since fall 3
months ago. Bilateral leg pain.

EXAM:
MRI LUMBAR SPINE WITHOUT CONTRAST
TECHNIQUE: Multiplanar, multisequence MR imaging of the lumbar spine was
performed. No intravenous contrast was administered.

[Series 5: T2 · sagittal · 4.0mm · 0.81mm/px · 6 of 17 slices shown (1 of 2)]
[im 1/17]
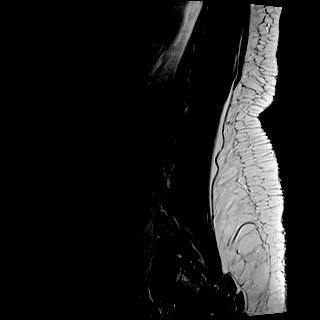
[im 4/17]
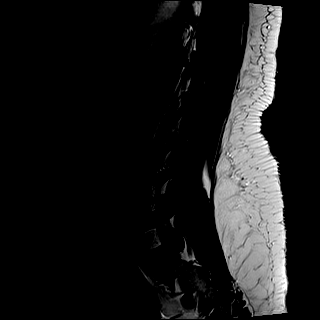
[im 7/17]
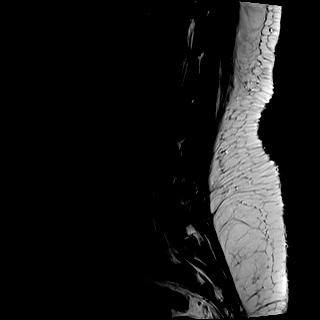
[im 10/17]
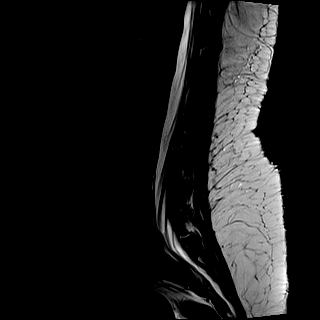
[im 13/17]
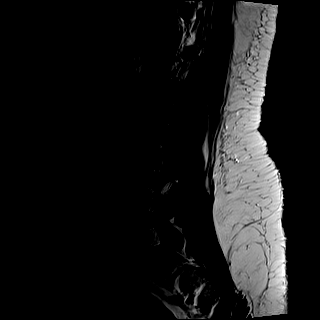
[im 17/17]
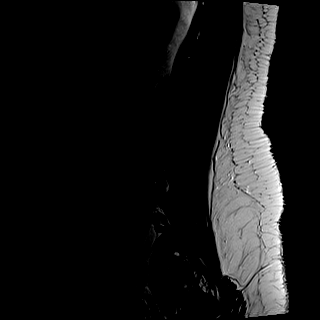

[Series 6: T1 · sagittal · 4.0mm · 0.81mm/px · 6 of 17 slices shown (1 of 2)]
[im 1/17]
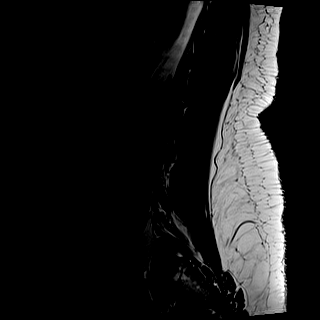
[im 4/17]
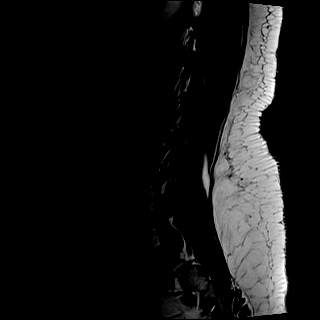
[im 7/17]
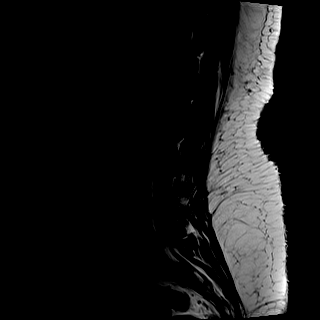
[im 10/17]
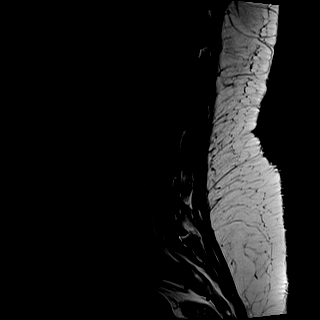
[im 13/17]
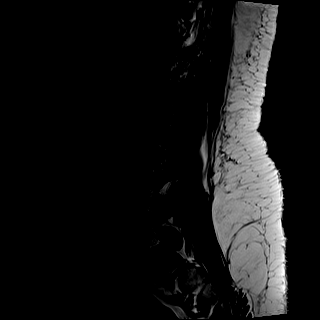
[im 17/17]
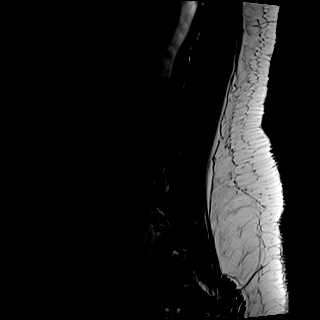

[Series 7: STIR · sagittal · 4.0mm · 0.41mm/px · 2 of 17 slices shown]
[im 1/17]
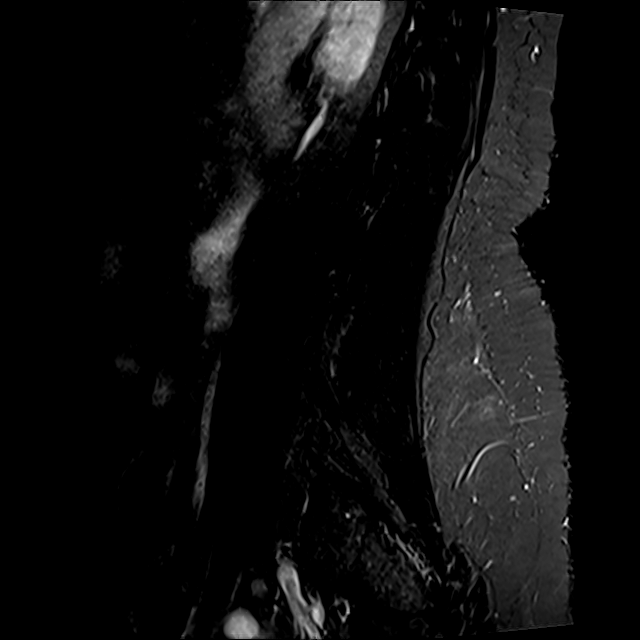
[im 4/17]
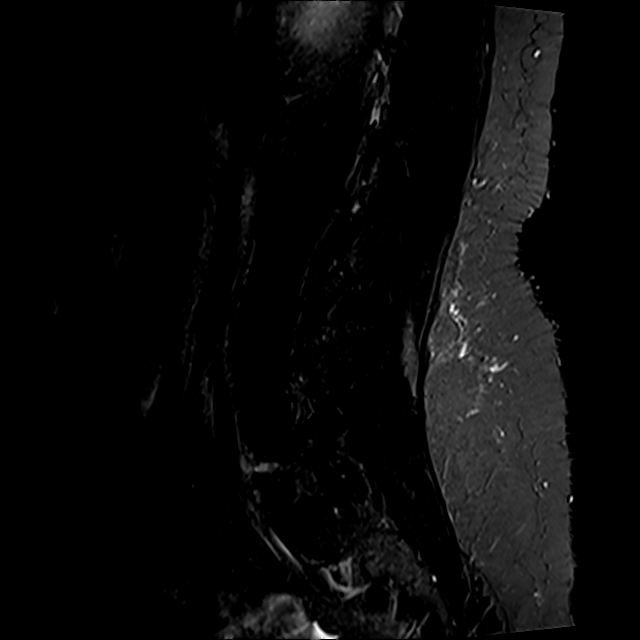

[Series 8: T2 · axial · 4.0mm · 0.78mm/px · z∈[-181,+45]mm · 9 of 39 slices shown (2 of 2)]
[im 1/39]
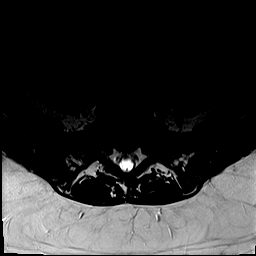
[im 6/39]
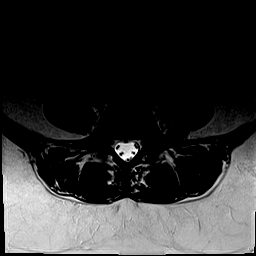
[im 11/39]
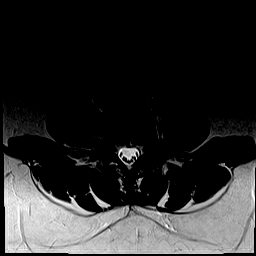
[im 17/39]
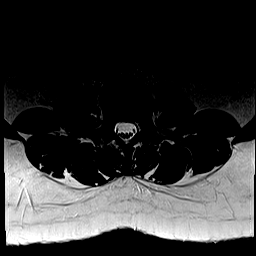
[im 20/39]
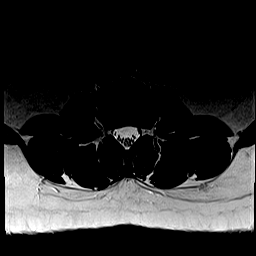
[im 22/39]
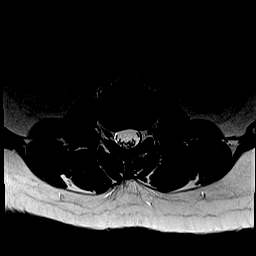
[im 28/39]
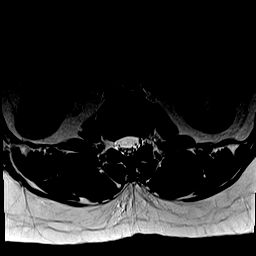
[im 33/39]
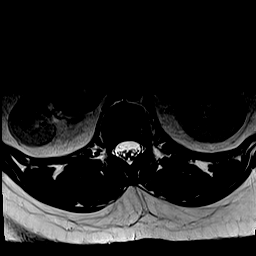
[im 39/39]
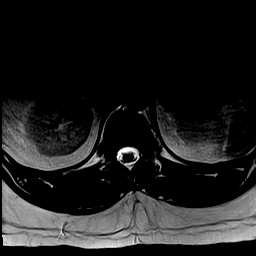

[Series 9: T1 · axial · 4.0mm · 0.39mm/px · z∈[-181,+45]mm · 9 of 39 slices shown (2 of 2)]
[im 1/39]
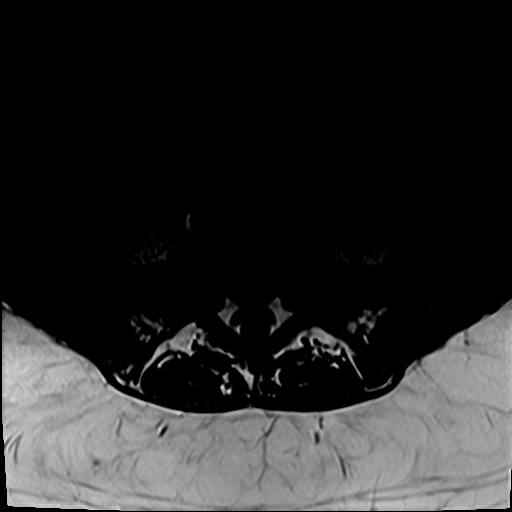
[im 6/39]
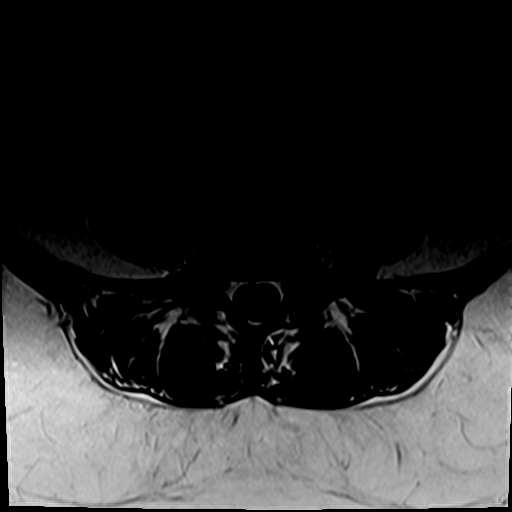
[im 11/39]
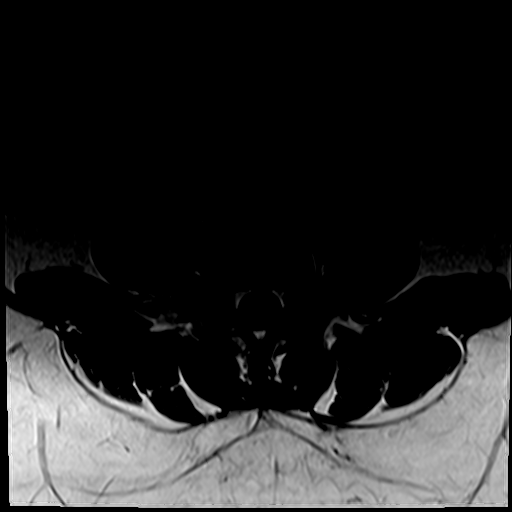
[im 17/39]
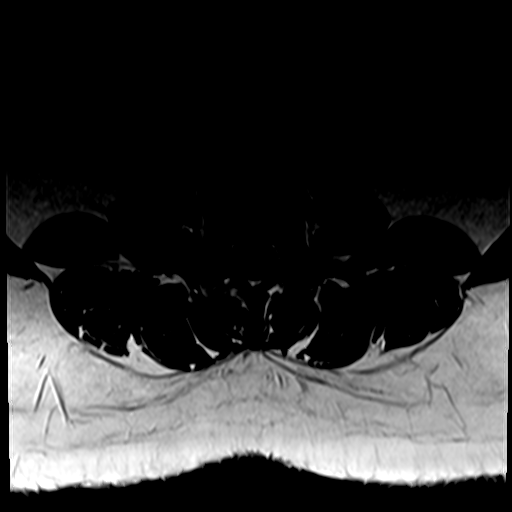
[im 20/39]
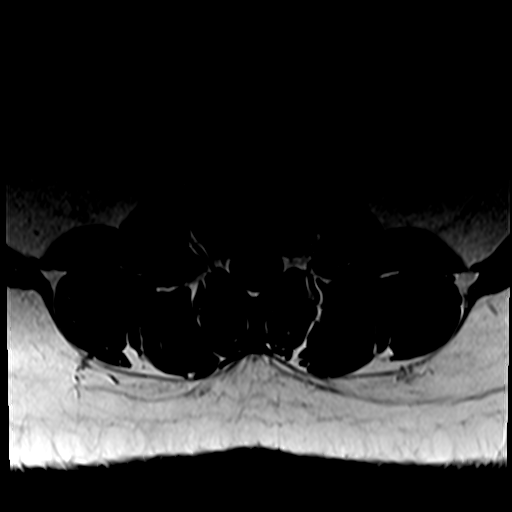
[im 22/39]
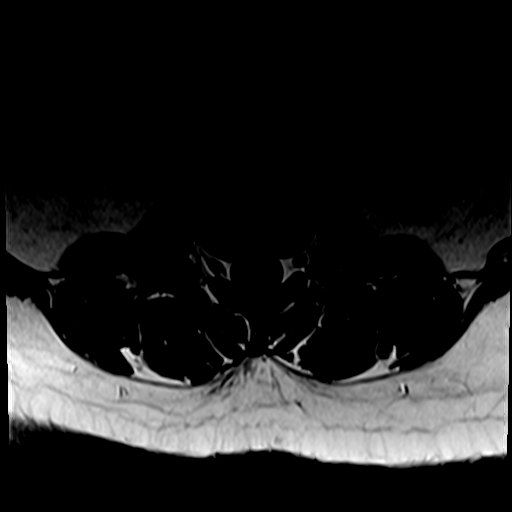
[im 28/39]
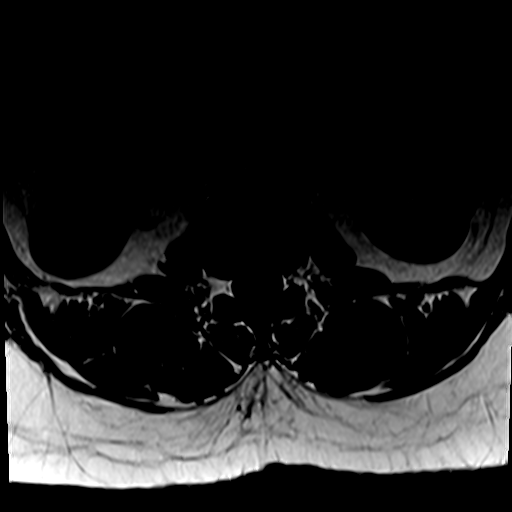
[im 33/39]
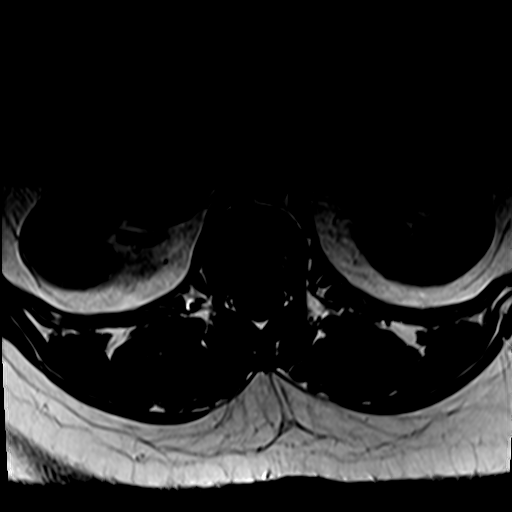
[im 39/39]
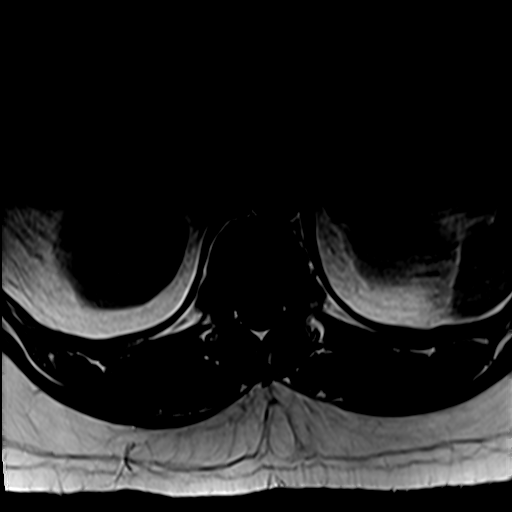

[32 of 48 positions shown; findings below may reference images not displayed]

FINDINGS: Segmentation:  Standard.

Alignment:  Physiologic.

Vertebrae:  No fracture, evidence of discitis, or bone lesion.

Conus medullaris and cauda equina: Conus extends to the L1 level.
Conus and cauda equina appear normal.

Paraspinal and other soft tissues: Negative.

Disc levels:

T12-L1 to L4-L5:  Negative.

L5-S1: Mild disc desiccation with tiny central disc protrusion. No
stenosis.
IMPRESSION: 1. Minimal degenerative disc disease at L5-S1. No stenosis or
impingement.

## 2021-03-18 MED ORDER — METHOCARBAMOL 500 MG PO TABS: 500.0000 mg | ORAL_TABLET | Freq: Four times a day (QID) | ORAL | 1 refills | Status: DC

## 2021-03-18 MED ORDER — METHOCARBAMOL 500 MG PO TABS
1000.0000 mg | ORAL_TABLET | Freq: Once | ORAL | Status: AC
  Administered 2021-03-18: 1000 mg via ORAL
  Filled 2021-03-18: qty 2

## 2021-03-18 MED ORDER — PREDNISONE 10 MG PO TABS: 10.0000 mg | ORAL_TABLET | ORAL | 0 refills | Status: DC

## 2021-03-18 MED ORDER — MELOXICAM 15 MG PO TABS: 15.0000 mg | ORAL_TABLET | Freq: Every day | ORAL | 1 refills | Status: DC

## 2021-03-18 MED ORDER — MELOXICAM 7.5 MG PO TABS
15.0000 mg | ORAL_TABLET | Freq: Once | ORAL | Status: AC
  Administered 2021-03-18: 15 mg via ORAL
  Filled 2021-03-18: qty 2

## 2021-03-18 MED ORDER — HYDROCODONE-ACETAMINOPHEN 5-325 MG PO TABS: 1.0000 | ORAL_TABLET | ORAL | 0 refills | Status: DC | PRN

## 2021-03-18 MED ORDER — OXYCODONE-ACETAMINOPHEN 5-325 MG PO TABS
1.0000 | ORAL_TABLET | Freq: Once | ORAL | Status: AC
  Administered 2021-03-18: 1 via ORAL
  Filled 2021-03-18: qty 1

## 2021-03-18 MED ORDER — PREDNISONE 20 MG PO TABS
60.0000 mg | ORAL_TABLET | Freq: Once | ORAL | Status: AC
  Administered 2021-03-18: 60 mg via ORAL
  Filled 2021-03-18: qty 3

## 2021-03-18 NOTE — ED Triage Notes (Signed)
Pt comes pov with head pain, pressure, dizziness, and shoulder/back pain. Started when she had to go back to work last Saturday. Increased yesterday when she was at work again. C/o lower back pain.

## 2021-03-18 NOTE — ED Notes (Signed)
Pt given urine cup for sample, instructed to produce sample as soon as possible

## 2021-03-18 NOTE — ED Provider Notes (Signed)
[facility] Emergency Department Provider Note  ____________________________________________  Time seen: Approximately 11:47 AM  I have reviewed the triage vital signs and the nursing notes.   HISTORY  Chief Complaint Dizziness and Headache    HPI Teresa Meza is a 40 y.o. female who presents the emergency department complaining of lower back pain.  Patient states that she has been dealing with issues with her lower back since a fall at work 3 months ago.  Patient states that she is having intense pain in the lumbar spine that is been radiating down her right leg.  More recently she has started to develop some numbness of the leg and is now experiencing pain radiating down the left leg.  No new trauma or injuries.  She states that she cannot sit or lay down for any period of time without hurting.  She states that standing drastically increases the pain.  She states that she has had no urinary or bowel incontinence.  No dysuria, polyuria, hematuria.  No GI complaints.  Triage had documented that patient was having other symptoms including head pain, dizziness, shoulder and back pain.  Patient states that her lower back pain will radiate into her upper back if she does not move positions and that sometimes the pain makes her feel lightheaded, but she denies any headache, visual changes, neck pain or stiffness, chest pain, shortness of breath.  Her primary complaint is increased lower back pain       Past Medical History:  Diagnosis Date   Ectopic fetus    Elderly multigravida 08/26/2017   [ ]  NIPT [ ]  NST/AFI weekly starting at 36 weeks   Elevated blood pressure reading without diagnosis of hypertension 02/10/2018    Patient Active Problem List   Diagnosis Date Noted   Encounter for induction of labor 02/14/2018   Abdominal wall cellulitis 02/11/2018   Abdominal pain 02/10/2018   Anemia 02/10/2018   Preeclampsia, third trimester 02/10/2018   Indication for  care in labor and delivery, antepartum 01/24/2018   Pregnancy 01/24/2018   Cough 11/07/2017   Labor and delivery indication for care or intervention 10/24/2017   Advanced maternal age in multigravida, unspecified trimester 10/23/2017   Gestational diabetes mellitus (GDM) affecting fourth pregnancy 10/14/2017   BMI 37.0-37.9, adult 10/08/2017   Obesity in pregnancy 10/08/2017   Supervision of high risk pregnancy, antepartum 08/26/2017   Hx of gestational diabetes in prior pregnancy, currently pregnant 08/26/2017    Past Surgical History:  Procedure Laterality Date   ECTOPIC PREGNANCY SURGERY      Prior to Admission medications   Medication Sig Start Date End Date Taking? Authorizing Provider  HYDROcodone-acetaminophen (NORCO/VICODIN) 5-325 MG tablet Take 1 tablet by mouth every 4 (four) hours as needed for moderate pain. 03/18/21 03/18/22 Yes Henderson Frampton, 03/20/21, PA-C  meloxicam (MOBIC) 15 MG tablet Take 1 tablet (15 mg total) by mouth daily. 03/18/21  Yes Halei Hanover, Delorise Royals, PA-C  methocarbamol (ROBAXIN) 500 MG tablet Take 1 tablet (500 mg total) by mouth 4 (four) times daily. 03/18/21  Yes Tabbitha Janvrin, Delorise Royals, PA-C  predniSONE (DELTASONE) 10 MG tablet Take 1 tablet (10 mg total) by mouth as directed. 03/18/21  Yes Mialani Reicks, Delorise Royals, PA-C  tiZANidine (ZANAFLEX) 4 MG tablet Take 1 tablet (4 mg total) by mouth 3 (three) times daily. 01/09/21   Delorise Royals B, FNP    Allergies Sulfa antibiotics  Family History  Problem Relation Age of Onset   Diabetes Mellitus II Mother  Social History Social History   Tobacco Use   Smoking status: Never   Smokeless tobacco: Never  Substance Use Topics   Alcohol use: No   Drug use: No     Review of Systems  Constitutional: No fever/chills Eyes: No visual changes. No discharge ENT: No upper respiratory complaints. Cardiovascular: no chest pain. Respiratory: no cough. No SOB. Gastrointestinal: No abdominal pain.  No nausea, no  vomiting.  No diarrhea.  No constipation. Genitourinary: Negative for dysuria. No hematuria Musculoskeletal: Significant lower back pain that is progressively worsening after an injury 3 months ago.  Initially had radicular symptoms down the right leg, no symptoms down the left leg.  No bowel or bladder incontinence, saddle anesthesia or paresthesias. Skin: Negative for rash, abrasions, lacerations, ecchymosis. Neurological: Negative for headaches, focal weakness or numbness.  10 System ROS otherwise negative.  ____________________________________________   PHYSICAL EXAM:  VITAL SIGNS: ED Triage Vitals [03/18/21 0942]  Enc Vitals Group     BP (!) 115/98     Pulse Rate (!) 114     Resp 18     Temp 98 F (36.7 C)     Temp Source Oral     SpO2 97 %     Weight 198 lb 6.6 oz (90 kg)     Height 5\' 3"  (1.6 m)     Head Circumference      Peak Flow      Pain Score 10     Pain Loc      Pain Edu?      Excl. in GC?      Constitutional: Alert and oriented. Well appearing and in no acute distress. Eyes: Conjunctivae are normal. PERRL. EOMI. Head: Atraumatic. ENT:      Ears:       Nose: No congestion/rhinnorhea.      Mouth/Throat: Mucous membranes are moist.  Neck: No stridor.  No cervical spine tenderness to palpation.  Cardiovascular: Normal rate, regular rhythm. Normal S1 and S2.  Good peripheral circulation. Respiratory: Normal respiratory effort without tachypnea or retractions. Lungs CTAB. Good air entry to the bases with no decreased or absent breath sounds. Gastrointestinal: Bowel sounds 4 quadrants. Soft and nontender to palpation. No guarding or rigidity. No palpable masses. No distention. No CVA tenderness. Musculoskeletal: Full range of motion to all extremities. No gross deformities appreciated. Neurologic:  Normal speech and language. No gross focal neurologic deficits are appreciated.  Skin:  Skin is warm, dry and intact. No rash noted. Psychiatric: Mood and affect  are normal. Speech and behavior are normal. Patient exhibits appropriate insight and judgement.   ____________________________________________   LABS (all labs ordered are listed, but only abnormal results are displayed)  Labs Reviewed  BASIC METABOLIC PANEL - Abnormal; Notable for the following components:      Result Value   Sodium 134 (*)    Glucose, Bld 148 (*)    All other components within normal limits  URINALYSIS, COMPLETE (UACMP) WITH MICROSCOPIC - Abnormal; Notable for the following components:   Color, Urine YELLOW (*)    APPearance HAZY (*)    Hgb urine dipstick MODERATE (*)    Bacteria, UA MANY (*)    All other components within normal limits  CBC  POC URINE PREG, ED  CBG MONITORING, ED  TROPONIN I (HIGH SENSITIVITY)   ____________________________________________  EKG   ____________________________________________  RADIOLOGY I personally viewed and evaluated these images as part of my medical decision making, as well as reviewing the written report  by the radiologist.  ED Provider Interpretation: MRI was a small bulging disc at L5-S1 but no stenosis or impingement  MR LUMBAR SPINE WO CONTRAST  Result Date: 03/18/2021 CLINICAL DATA:  Progressively worsening low back pain since fall 3 months ago. Bilateral leg pain. EXAM: MRI LUMBAR SPINE WITHOUT CONTRAST TECHNIQUE: Multiplanar, multisequence MR imaging of the lumbar spine was performed. No intravenous contrast was administered. COMPARISON:  Lumbar spine x-rays dated December 30, 2020. FINDINGS: Segmentation:  Standard. Alignment:  Physiologic. Vertebrae:  No fracture, evidence of discitis, or bone lesion. Conus medullaris and cauda equina: Conus extends to the L1 level. Conus and cauda equina appear normal. Paraspinal and other soft tissues: Negative. Disc levels: T12-L1 to L4-L5:  Negative. L5-S1: Mild disc desiccation with tiny central disc protrusion. No stenosis. IMPRESSION: 1. Minimal degenerative disc disease at  L5-S1. No stenosis or impingement. Electronically Signed   By: Obie Dredge M.D.   On: 03/18/2021 14:38    ____________________________________________    PROCEDURES  Procedure(s) performed:    Procedures    Medications  oxyCODONE-acetaminophen (PERCOCET/ROXICET) 5-325 MG per tablet 1 tablet (1 tablet Oral Given 03/18/21 1528)  methocarbamol (ROBAXIN) tablet 1,000 mg (1,000 mg Oral Given 03/18/21 1627)  meloxicam (MOBIC) tablet 15 mg (15 mg Oral Given 03/18/21 1627)  predniSONE (DELTASONE) tablet 60 mg (60 mg Oral Given 03/18/21 1627)     ____________________________________________   INITIAL IMPRESSION / ASSESSMENT AND PLAN / ED COURSE  Pertinent labs & imaging results that were available during my care of the patient were reviewed by me and considered in my medical decision making (see chart for details).  Review of the [place] CSRS was performed in accordance of the [facility] prior to dispensing any controlled drugs.           Patient's diagnosis is consistent with sciatica.  Patient presents the emergency department with worsening right lower back pain with radicular symptoms on the right leg.  Patient had a fall at work 3 months ago, has had ongoing and worsening symptoms.  Patient had initially addressed some issues to include dizziness and headache with triage, however she currently denies any complaints with same.  She states that sometimes when her back pain has increased she will also have a headache but there is no headache currently.  She states that the back pain is becoming more more severe, initially was just affecting the right leg and is now affecting the left leg.  No urinary symptoms.  No bowel or bladder incontinence, saddle anesthesia or paresthesias.  Given the progressive nature of symptoms and the fact the patient has had plain film x-rays with no acute findings I pursued MRI at this time.  Small bulge identified in the L5-S1 region but no acute impingement of the  central cord or evidence of multilevel degeneration.  Patient will be treated with symptom control medications and advised to follow-up with neurosurgery for further management of her ongoing symptoms.  No indication for further work-up at this time..  Patient is given ED precautions to return to the ED for any worsening or new symptoms.     ____________________________________________  FINAL CLINICAL IMPRESSION(S) / ED DIAGNOSES  Final diagnoses:  Acute midline low back pain with right-sided sciatica      NEW MEDICATIONS STARTED DURING THIS VISIT:  ED Discharge Orders          Ordered    predniSONE (DELTASONE) 10 MG tablet  As directed       Note to Pharmacy: Take  on a pattern of 6, 6, 5, 5, 4, 4, 3, 3, 2, 2, 1, 1   03/18/21 1612    methocarbamol (ROBAXIN) 500 MG tablet  4 times daily        03/18/21 1612    meloxicam (MOBIC) 15 MG tablet  Daily        03/18/21 1612    HYDROcodone-acetaminophen (NORCO/VICODIN) 5-325 MG tablet  Every 4 hours PRN        03/18/21 1612                This chart was dictated using voice recognition software/Dragon. Despite best efforts to proofread, errors can occur which can change the meaning. Any change was purely unintentional.    Racheal PatchesCuthriell, Finnean Cerami D, PA-C 03/18/21 1657    Merwyn KatosBradler, Evan K, MD 03/19/21 1048

## 2021-03-18 NOTE — ED Notes (Signed)
Placed pillow under pt's R leg and pt given warm blanket

## 2021-04-03 ENCOUNTER — Other Ambulatory Visit: Payer: Self-pay | Admitting: Obstetrics

## 2021-04-03 DIAGNOSIS — Z1231 Encounter for screening mammogram for malignant neoplasm of breast: Secondary | ICD-10-CM

## 2021-04-06 DIAGNOSIS — R269 Unspecified abnormalities of gait and mobility: Secondary | ICD-10-CM | POA: Insufficient documentation

## 2021-04-06 DIAGNOSIS — M5416 Radiculopathy, lumbar region: Secondary | ICD-10-CM | POA: Insufficient documentation

## 2021-04-06 DIAGNOSIS — M6281 Muscle weakness (generalized): Secondary | ICD-10-CM | POA: Insufficient documentation

## 2021-05-19 ENCOUNTER — Encounter: Payer: Self-pay | Admitting: Intensive Care

## 2021-05-19 ENCOUNTER — Emergency Department
Admission: EM | Admit: 2021-05-19 | Discharge: 2021-05-19 | Disposition: A | Discharge status: Home / Self Care | Payer: Medicaid Other | Attending: Emergency Medicine | Admitting: Emergency Medicine

## 2021-05-19 ENCOUNTER — Emergency Department: Payer: Medicaid Other | Attending: Emergency Medicine

## 2021-05-19 ENCOUNTER — Other Ambulatory Visit: Payer: Self-pay

## 2021-05-19 ENCOUNTER — Emergency Department: Payer: Medicaid Other

## 2021-05-19 DIAGNOSIS — M5412 Radiculopathy, cervical region: Secondary | ICD-10-CM

## 2021-05-19 DIAGNOSIS — Z791 Long term (current) use of non-steroidal anti-inflammatories (NSAID): Secondary | ICD-10-CM | POA: Diagnosis not present

## 2021-05-19 DIAGNOSIS — G44201 Tension-type headache, unspecified, intractable: Secondary | ICD-10-CM | POA: Diagnosis present

## 2021-05-19 IMAGING — CT CT CERVICAL SPINE W/O CM
3 of 4 series · 12 of 33 positions shown, 14 images · non-contrast
Comparison: Cervical radiograph [DATE]

CLINICAL DATA: worsening neck pain, fall 5 months ago, worsening
pain and worsening radiculopathy

EXAM:
CT CERVICAL SPINE WITHOUT CONTRAST
TECHNIQUE: Multidetector CT imaging of the cervical spine was performed without
intravenous contrast. Multiplanar CT image reconstructions were also
generated.

[Series 6: sagittal bone · sagittal · 0.23mm/px · 5 of 75 slices shown, 6 images]
[im 25/75  bone]
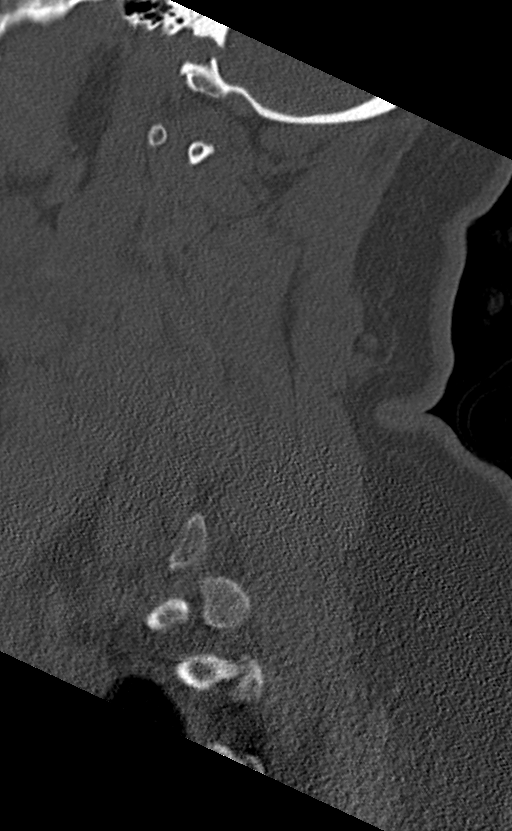
[im 31/75  bone]
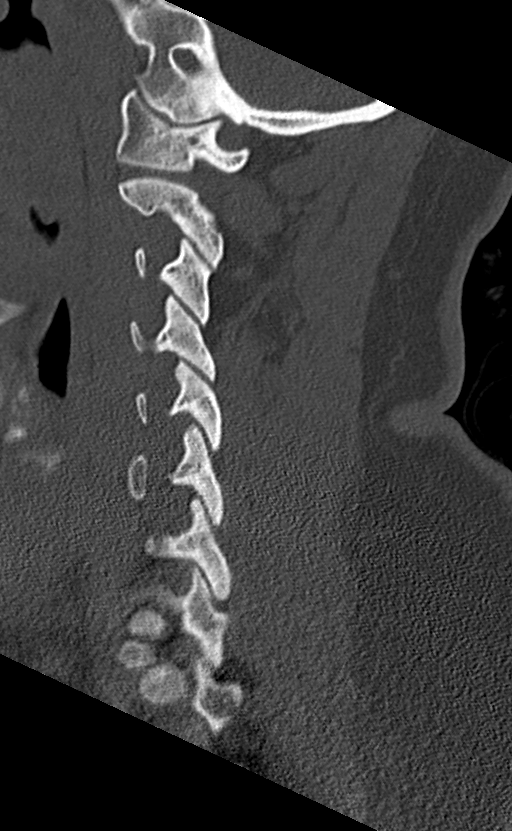
[im 38/75  soft-tissue]
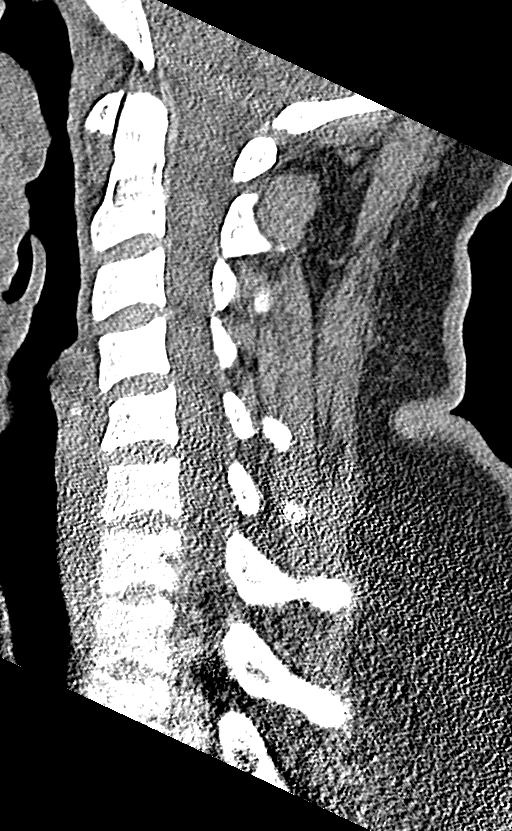
[im 38/75  bone]
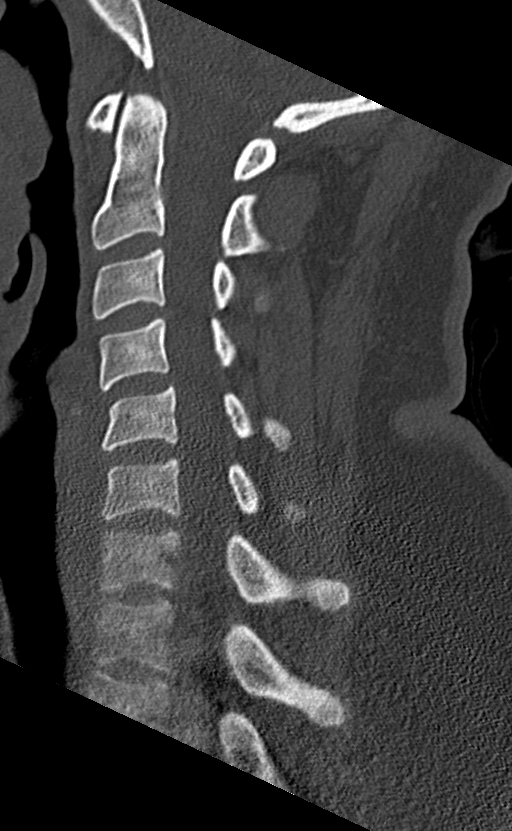
[im 44/75  bone]
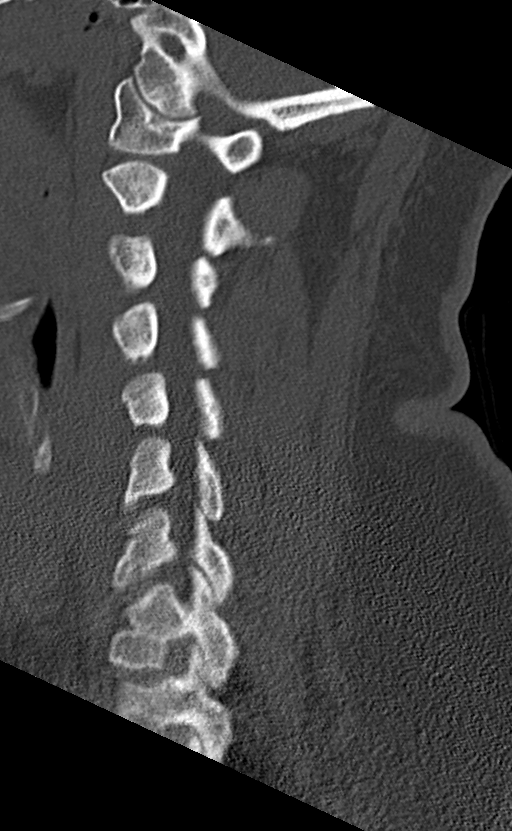
[im 50/75  bone]
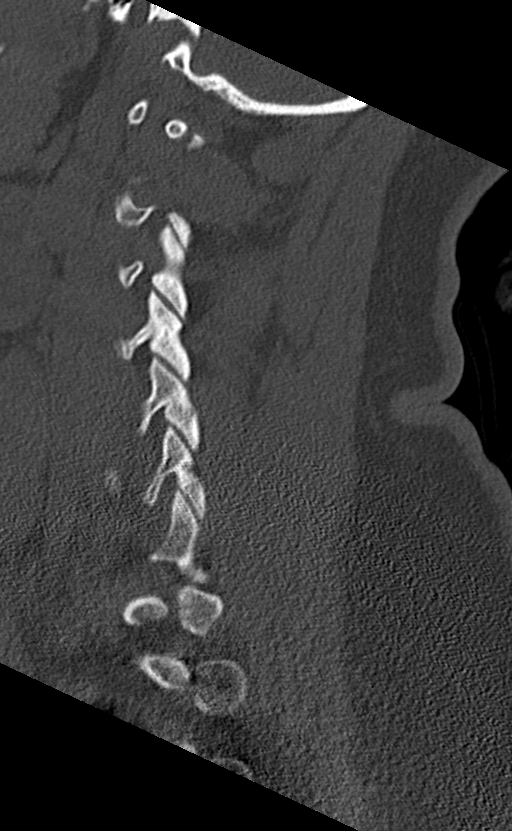

[Series 7: coronal bone · coronal · 0.29mm/px · 3 of 59 slices shown]
[im 12/59  bone]
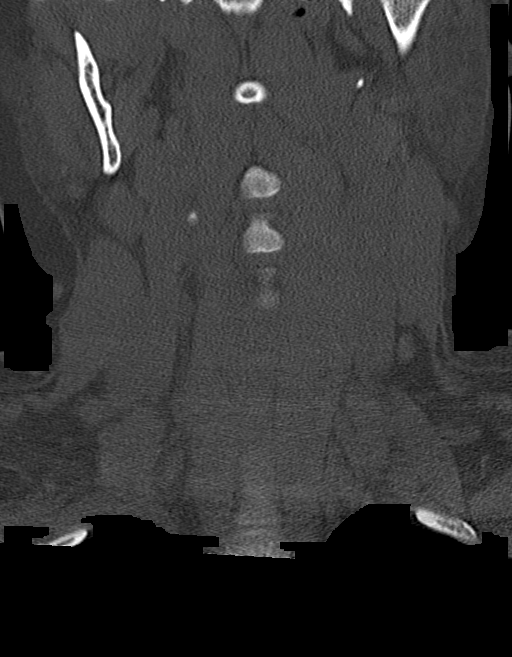
[im 24/59  bone]
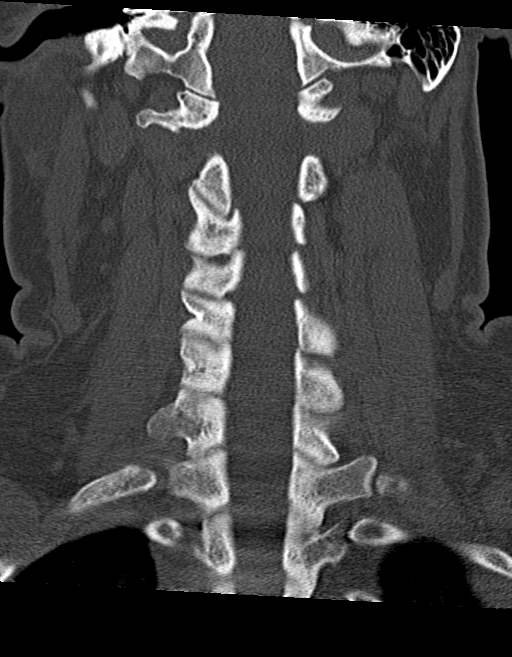
[im 35/59  bone]
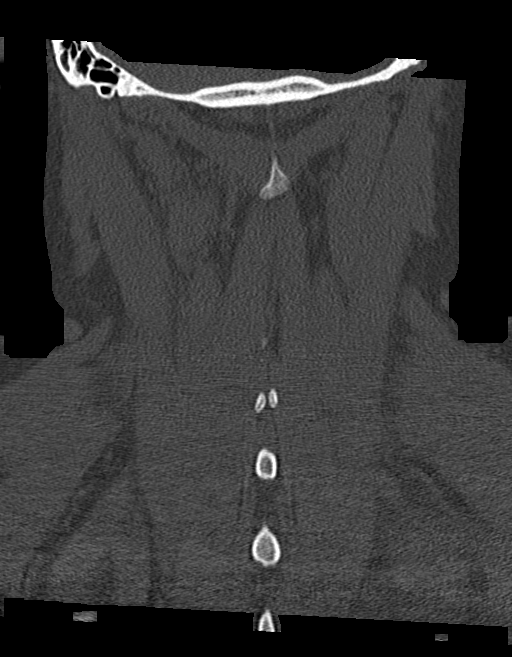

[Series 8: orthogonal bone · axial · 0.23mm/px · z∈[+327,+450]mm · 4 of 96 slices shown, 5 images]
[im 14/96  soft-tissue]
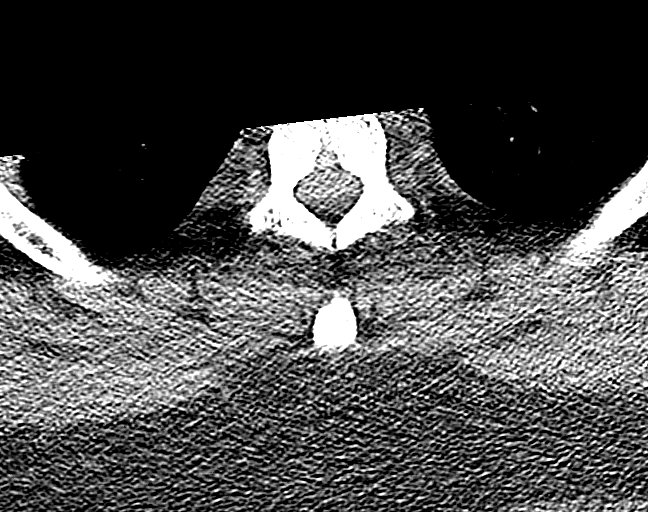
[im 14/96  bone]
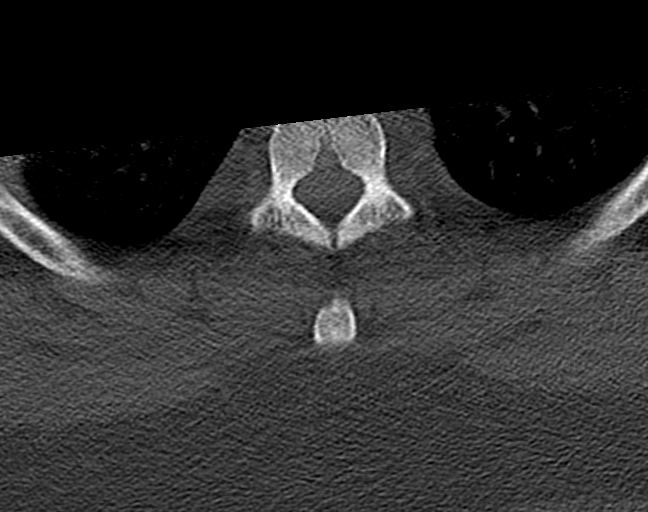
[im 41/96  bone]
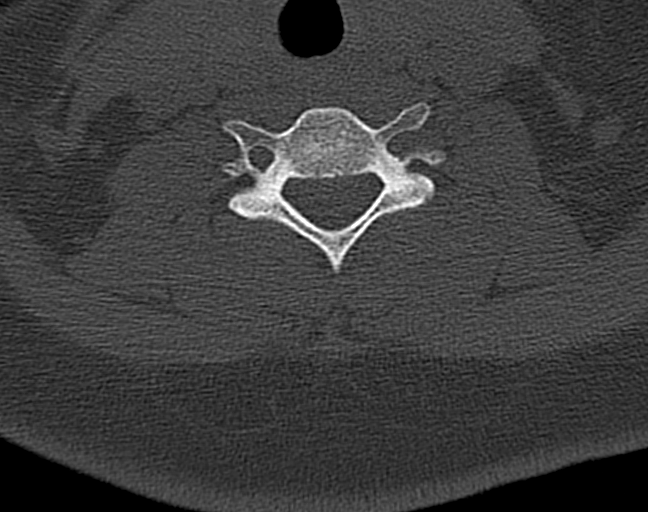
[im 55/96  bone]
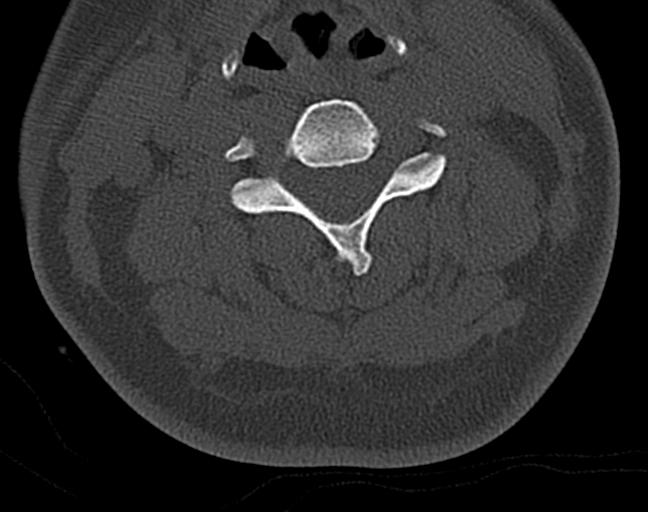
[im 82/96  bone]
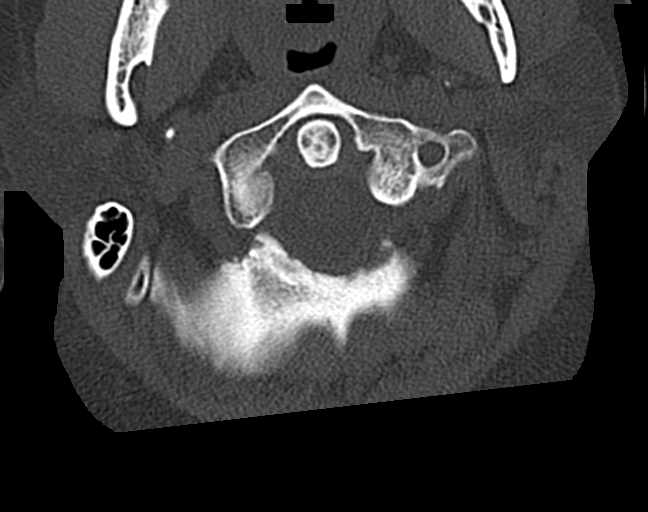

[12 of 33 positions shown; findings below may reference images not displayed]

FINDINGS: Alignment: Straightening of normal lordosis, similar to prior exam.
Trace anterolisthesis of C4 on C5 is also unchanged.

Skull base and vertebrae: No acute fracture. No primary bone lesion
or focal pathologic process.

Soft tissues and spinal canal: No prevertebral fluid or swelling. No
visible canal hematoma.

Disc levels:  The disc spaces are preserved.

Upper chest: No acute or unexpected findings

Other: None.
IMPRESSION: 1. No fracture or subluxation of the cervical spine.
2. Straightening of normal lordosis and trace anterolisthesis of C4
on C5 are unchanged from prior exam.

## 2021-05-19 IMAGING — CT CT HEAD W/O CM
4 series · 16 of 47 positions shown, 18 images · non-contrast
Comparison: Brain MRI [DATE]

CLINICAL DATA: Headache, intracranial hemorrhage suspected

Head pain. Patient reports symptoms for 1 month, worse in 2 days
ago.
EXAM:
CT HEAD WITHOUT CONTRAST
TECHNIQUE: Contiguous axial images were obtained from the base of the skull
through the vertex without intravenous contrast.

[Series 2: head bone · axial · 0.39mm/px · z∈[+496,+524]mm · 3 of 68 slices shown]
[im 7/68  bone]
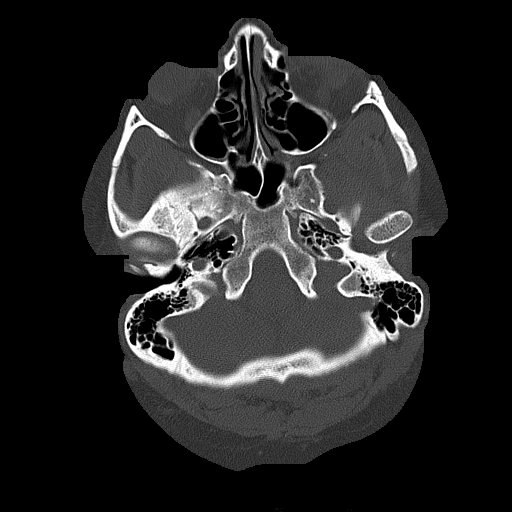
[im 14/68  bone]
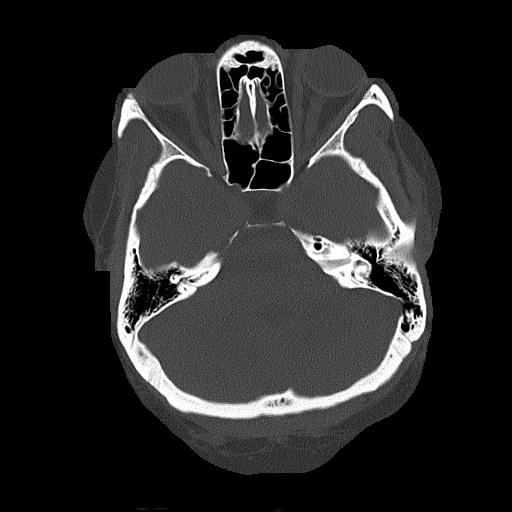
[im 21/68  bone]
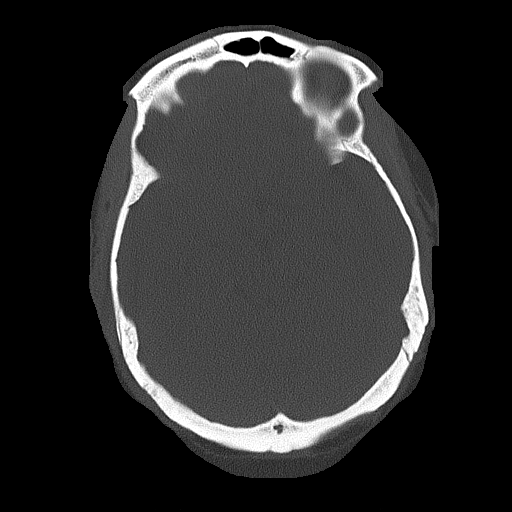

[Series 3: head wo · axial · 0.39mm/px · z∈[+499,+594]mm · 7 of 27 slices shown, 9 images]
[im 4/27  brain]
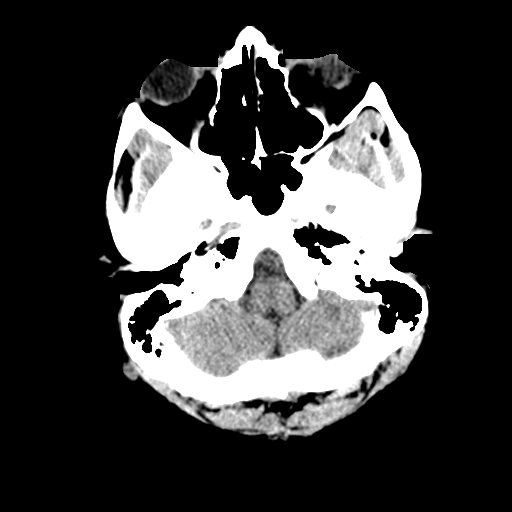
[im 4/27  bone]
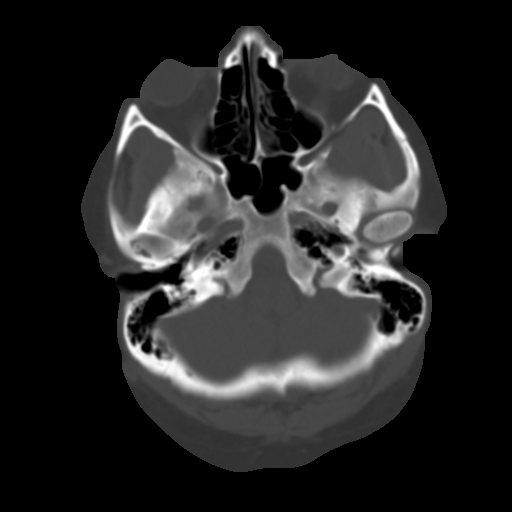
[im 7/27  brain]
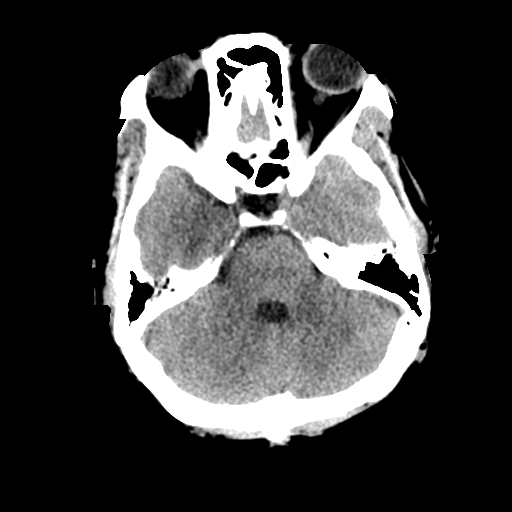
[im 10/27  brain]
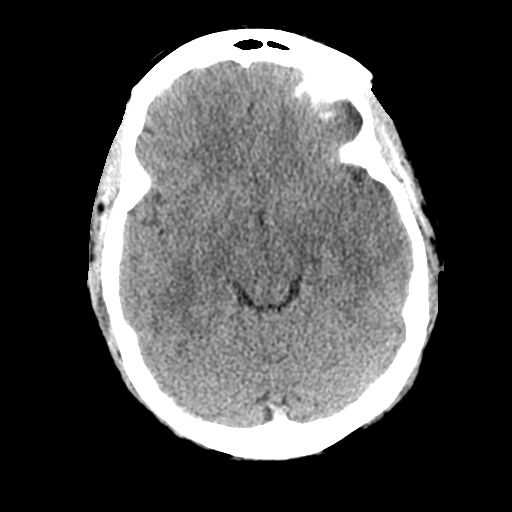
[im 14/27  brain]
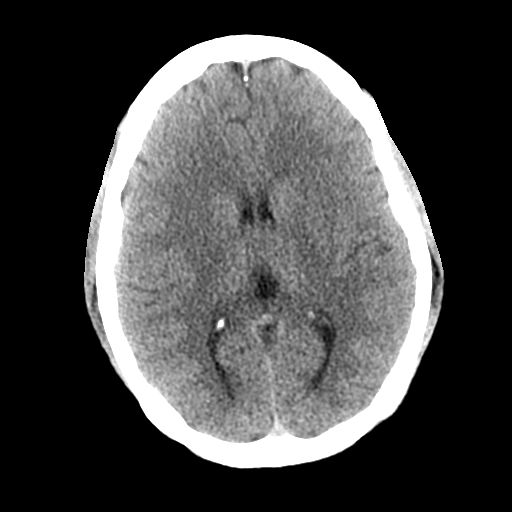
[im 17/27  brain]
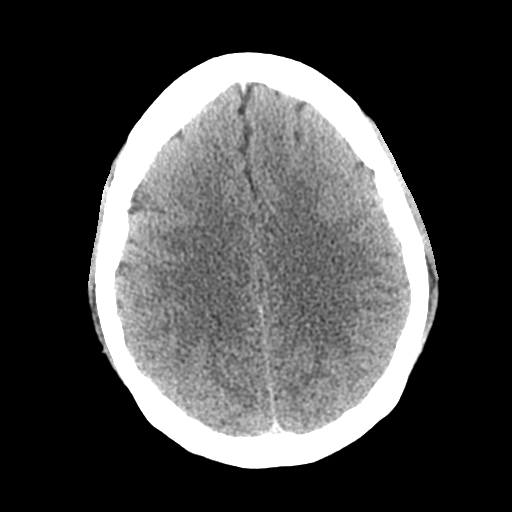
[im 17/27  bone]
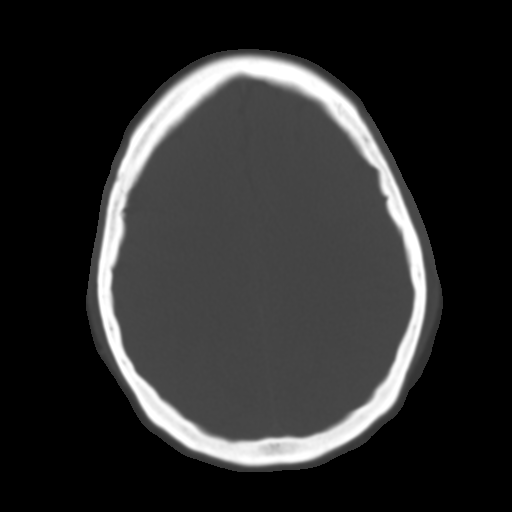
[im 20/27  brain]
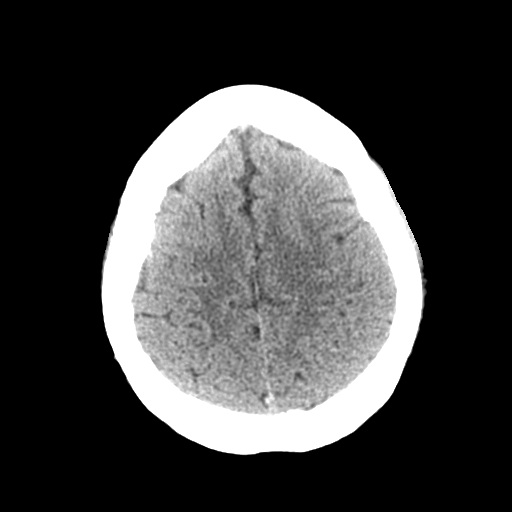
[im 23/27  brain]
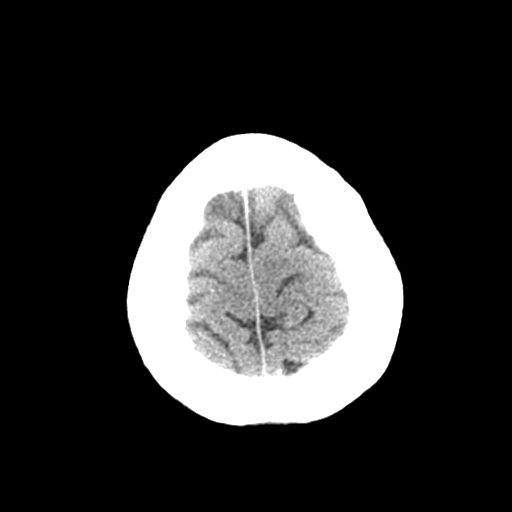

[Series 4: coronal soft tissue · coronal · 0.28mm/px · 3 of 65 slices shown]
[im 22/65  brain]
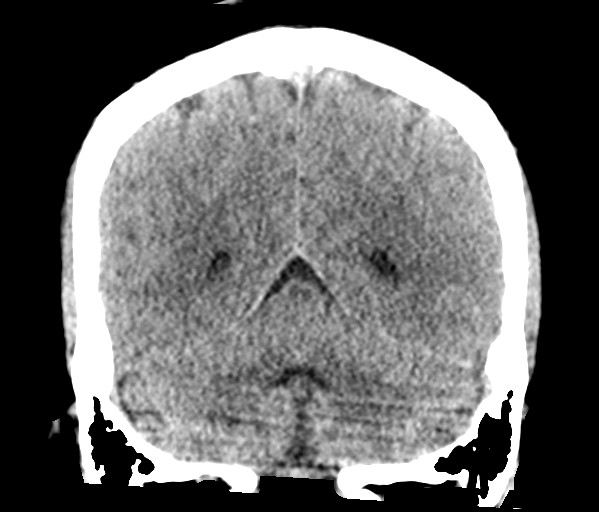
[im 29/65  brain]
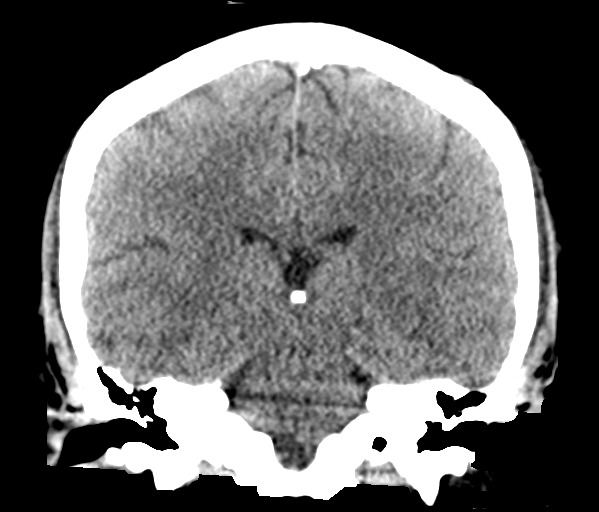
[im 36/65  brain]
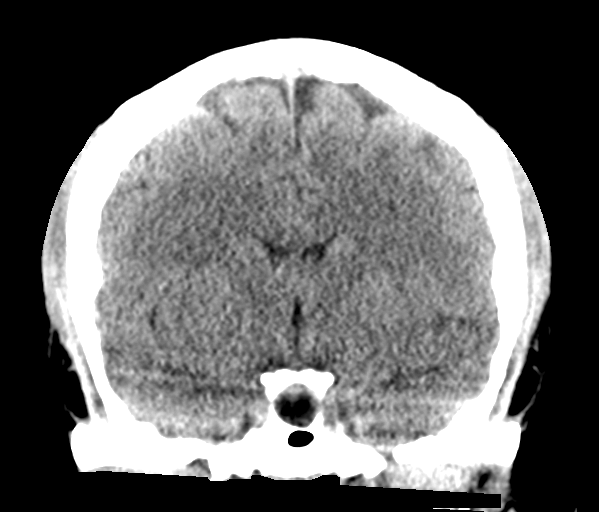

[Series 5: sagittal soft tissue · sagittal · 0.28mm/px · 3 of 55 slices shown]
[im 19/55  brain]
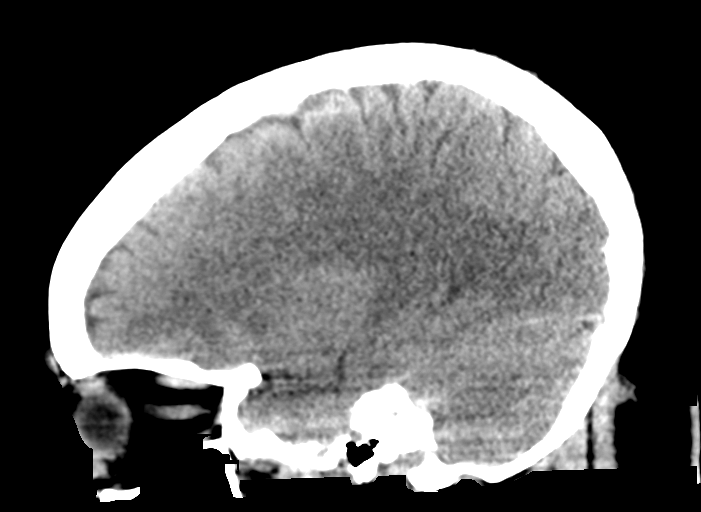
[im 28/55  brain]
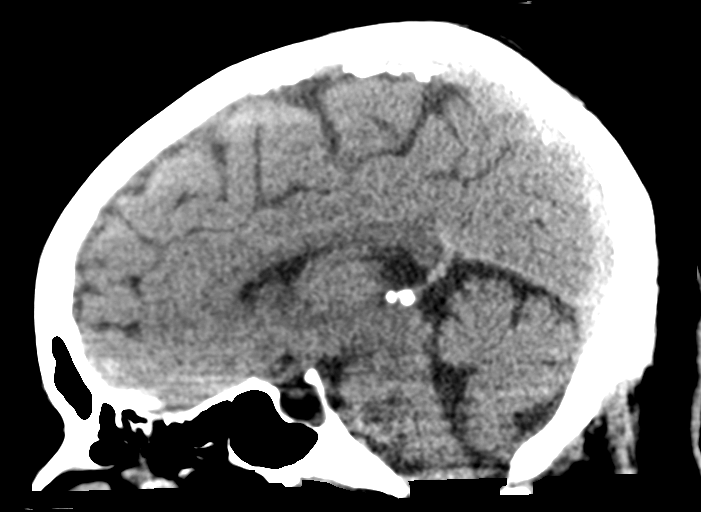
[im 37/55  brain]
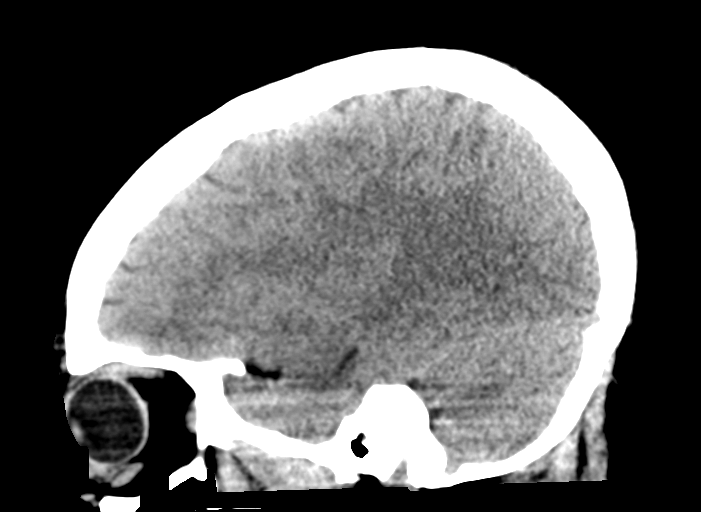

[16 of 47 positions shown; findings below may reference images not displayed]

FINDINGS: Brain: Empty sella, as seen on prior MRI, unchanged. No intracranial
hemorrhage, mass effect, or midline shift. No hydrocephalus. The
basilar cisterns are patent. No evidence of territorial infarct or
acute ischemia. No extra-axial or intracranial fluid collection.

Vascular: No hyperdense vessel or unexpected calcification.

Skull: Normal. Negative for fracture or focal lesion.

Sinuses/Orbits: Paranasal sinuses and mastoid air cells are clear.
The visualized orbits are unremarkable.

Other: None.
IMPRESSION: 1. No acute intracranial abnormality.
2. Empty sella, as seen on prior MRI, unchanged. This is typically
incidental, however can be seen in the setting of idiopathic
intracranial hypertension.

## 2021-05-19 MED ORDER — METHOCARBAMOL 500 MG PO TABS: 500.0000 mg | ORAL_TABLET | Freq: Four times a day (QID) | ORAL | 0 refills | Status: DC

## 2021-05-19 MED ORDER — METHOCARBAMOL 500 MG PO TABS
500.0000 mg | ORAL_TABLET | Freq: Once | ORAL | Status: AC
  Administered 2021-05-19: 500 mg via ORAL
  Filled 2021-05-19: qty 1

## 2021-05-19 MED ORDER — KETOROLAC TROMETHAMINE 30 MG/ML IJ SOLN
30.0000 mg | Freq: Once | INTRAMUSCULAR | Status: AC
  Administered 2021-05-19: 30 mg via INTRAMUSCULAR
  Filled 2021-05-19: qty 1

## 2021-05-19 MED ORDER — SUMATRIPTAN SUCCINATE 6 MG/0.5ML ~~LOC~~ SOLN
6.0000 mg | Freq: Once | SUBCUTANEOUS | Status: AC
  Administered 2021-05-19: 6 mg via SUBCUTANEOUS
  Filled 2021-05-19: qty 0.5

## 2021-05-19 MED ORDER — BUTALBITAL-APAP-CAFFEINE 50-325-40 MG PO TABS: 1.0000 | ORAL_TABLET | Freq: Four times a day (QID) | ORAL | 0 refills | Status: DC | PRN

## 2021-05-19 MED ORDER — PROMETHAZINE HCL 25 MG/ML IJ SOLN
25.0000 mg | Freq: Four times a day (QID) | INTRAMUSCULAR | Status: DC | PRN
  Administered 2021-05-19: 25 mg via INTRAMUSCULAR
  Filled 2021-05-19: qty 1

## 2021-05-19 MED ORDER — MELOXICAM 15 MG PO TABS: 15.0000 mg | ORAL_TABLET | Freq: Every day | ORAL | 0 refills | Status: DC

## 2021-05-19 NOTE — ED Provider Notes (Signed)
[facility] Emergency Department Provider Note  ____________________________________________  Time seen: Approximately 3:18 PM  I have reviewed the triage vital signs and the nursing notes.   HISTORY  Chief Complaint Neck Pain    HPI Teresa Meza is a 40 y.o. female who presents the emergency department complaining of sharp headache, neck pain.  Patient had a fall 5 months ago.  She has had ongoing pain and has been evaluated by orthopedics.  Patient has had MRIs of her entire spine, MRI of her wrist.  There was no acute findings from imaging though patient has had ongoing symptoms.  2 days ago patient had worsening neck pain with worsening radicular symptoms down the right upper extremity.  No symptoms on the left upper extremity.  No loss of range of motion to the extremity or neck.  No fevers or chills, URI symptoms.  Patient is also having a headache which she states radiates from her neck into her head.  Patient denies any new trauma.  No difficulty formulating thoughts or words.  Patient has no unilateral weakness.  Symptoms have been ongoing x2 days.       Past Medical History:  Diagnosis Date   Ectopic fetus    Elderly multigravida 08/26/2017   [ ]  NIPT [ ]  NST/AFI weekly starting at 36 weeks   Elevated blood pressure reading without diagnosis of hypertension 02/10/2018    Patient Active Problem List   Diagnosis Date Noted   Encounter for induction of labor 02/14/2018   Abdominal wall cellulitis 02/11/2018   Abdominal pain 02/10/2018   Anemia 02/10/2018   Preeclampsia, third trimester 02/10/2018   Indication for care in labor and delivery, antepartum 01/24/2018   Pregnancy 01/24/2018   Cough 11/07/2017   Labor and delivery indication for care or intervention 10/24/2017   Advanced maternal age in multigravida, unspecified trimester 10/23/2017   Gestational diabetes mellitus (GDM) affecting fourth pregnancy 10/14/2017   BMI 37.0-37.9, adult  10/08/2017   Obesity in pregnancy 10/08/2017   Supervision of high risk pregnancy, antepartum 08/26/2017   Hx of gestational diabetes in prior pregnancy, currently pregnant 08/26/2017    Past Surgical History:  Procedure Laterality Date   ECTOPIC PREGNANCY SURGERY      Prior to Admission medications   Medication Sig Start Date End Date Taking? Authorizing Provider  butalbital-acetaminophen-caffeine (FIORICET) 50-325-40 MG tablet Take 1 tablet by mouth every 6 (six) hours as needed for headache. 05/19/21 05/19/22 Yes Sakai Wolford, Delorise Royals, PA-C  meloxicam (MOBIC) 15 MG tablet Take 1 tablet (15 mg total) by mouth daily. 05/19/21  Yes Talyah Seder, Delorise Royals, PA-C  methocarbamol (ROBAXIN) 500 MG tablet Take 1 tablet (500 mg total) by mouth 4 (four) times daily. 05/19/21  Yes Jayra Choyce, Delorise Royals, PA-C    Allergies Sulfa antibiotics  Family History  Problem Relation Age of Onset   Diabetes Mellitus II Mother     Social History Social History   Tobacco Use   Smoking status: Never   Smokeless tobacco: Never  Substance Use Topics   Alcohol use: No   Drug use: No     Review of Systems  Constitutional: No fever/chills Eyes: No visual changes. No discharge ENT: No upper respiratory complaints. Cardiovascular: no chest pain. Respiratory: no cough. No SOB. Gastrointestinal: No abdominal pain.  No nausea, no vomiting.  No diarrhea.  No constipation. Musculoskeletal: Worsening neck pain with right-sided cervical radiculopathy Skin: Negative for rash, abrasions, lacerations, ecchymosis. Neurological: Positive for headache, denies focal weakness or numbness.  10 System ROS otherwise negative.  ____________________________________________   PHYSICAL EXAM:  VITAL SIGNS: ED Triage Vitals  Enc Vitals Group     BP 05/19/21 1252 128/60     Pulse Rate 05/19/21 1252 91     Resp 05/19/21 1252 16     Temp 05/19/21 1252 98.7 F (37.1 C)     Temp Source 05/19/21 1252 Oral     SpO2  05/19/21 1252 96 %     Weight 05/19/21 1253 208 lb (94.3 kg)     Height 05/19/21 1253 5' 3.5" (1.613 m)     Head Circumference --      Peak Flow --      Pain Score 05/19/21 1253 10     Pain Loc --      Pain Edu? --      Excl. in GC? --      Constitutional: Alert and oriented. Well appearing and in no acute distress. Eyes: Conjunctivae are normal. PERRL. EOMI. Head: Atraumatic. ENT:      Ears:       Nose: No congestion/rhinnorhea.      Mouth/Throat: Mucous membranes are moist.  Neck: No stridor.  Diffuse midline and right-sided cervical spine tenderness to palpation.  No palpable abnormalities.  Radial pulse and sensation intact and equal bilateral upper extremities  Cardiovascular: Normal rate, regular rhythm. Normal S1 and S2.  Good peripheral circulation. Respiratory: Normal respiratory effort without tachypnea or retractions. Lungs CTAB. Good air entry to the bases with no decreased or absent breath sounds. Musculoskeletal: Full range of motion to all extremities. No gross deformities appreciated. Neurologic:  Normal speech and language. No gross focal neurologic deficits are appreciated.  Cranial nerves II through XII grossly intact Skin:  Skin is warm, dry and intact. No rash noted. Psychiatric: Mood and affect are normal. Speech and behavior are normal. Patient exhibits appropriate insight and judgement.   ____________________________________________   LABS (all labs ordered are listed, but only abnormal results are displayed)  Labs Reviewed - No data to display ____________________________________________  EKG   ____________________________________________  RADIOLOGY I personally viewed and evaluated these images as part of my medical decision making, as well as reviewing the written report by the radiologist.  ED Provider Interpretation: No acute findings on CT scan.  Specifically no intracranial hemorrhage or evidence of CVA.  CT HEAD WO CONTRAST ( )  Result  Date: 05/19/2021 CLINICAL DATA:  Headache, intracranial hemorrhage suspected Head pain. Patient reports symptoms for 1 month, worse in 2 days ago. EXAM: CT HEAD WITHOUT CONTRAST TECHNIQUE: Contiguous axial images were obtained from the base of the skull through the vertex without intravenous contrast. COMPARISON:  Brain MRI 08/17/2019 FINDINGS: Brain: Empty sella, as seen on prior MRI, unchanged. No intracranial hemorrhage, mass effect, or midline shift. No hydrocephalus. The basilar cisterns are patent. No evidence of territorial infarct or acute ischemia. No extra-axial or intracranial fluid collection. Vascular: No hyperdense vessel or unexpected calcification. Skull: Normal. Negative for fracture or focal lesion. Sinuses/Orbits: Paranasal sinuses and mastoid air cells are clear. The visualized orbits are unremarkable. Other: None. IMPRESSION: 1. No acute intracranial abnormality. 2. Empty sella, as seen on prior MRI, unchanged. This is typically incidental, however can be seen in the setting of idiopathic intracranial hypertension. Electronically Signed   By: Narda Rutherford M.D.   On: 05/19/2021 16:07   CT Cervical Spine Wo Contrast  Result Date: 05/19/2021 CLINICAL DATA:  worsening neck pain, fall 5 months ago, worsening pain and worsening radiculopathy EXAM:  CT CERVICAL SPINE WITHOUT CONTRAST TECHNIQUE: Multidetector CT imaging of the cervical spine was performed without intravenous contrast. Multiplanar CT image reconstructions were also generated. COMPARISON:  Cervical radiograph 01/02/2021 FINDINGS: Alignment: Straightening of normal lordosis, similar to prior exam. Trace anterolisthesis of C4 on C5 is also unchanged. Skull base and vertebrae: No acute fracture. No primary bone lesion or focal pathologic process. Soft tissues and spinal canal: No prevertebral fluid or swelling. No visible canal hematoma. Disc levels:  The disc spaces are preserved. Upper chest: No acute or unexpected findings Other:  None. IMPRESSION: 1. No fracture or subluxation of the cervical spine. 2. Straightening of normal lordosis and trace anterolisthesis of C4 on C5 are unchanged from prior exam. Electronically Signed   By: Narda Rutherford M.D.   On: 05/19/2021 16:11    ____________________________________________    PROCEDURES  Procedure(s) performed:    Procedures    Medications  ketorolac (TORADOL) 30 MG/ML injection 30 mg (has no administration in time range)  SUMAtriptan (IMITREX) injection 6 mg (has no administration in time range)  promethazine (PHENERGAN) injection 25 mg (has no administration in time range)  methocarbamol (ROBAXIN) tablet 500 mg (has no administration in time range)     ____________________________________________   INITIAL IMPRESSION / ASSESSMENT AND PLAN / ED COURSE  Pertinent labs & imaging results that were available during my care of the patient were reviewed by me and considered in my medical decision making (see chart for details).  Review of the [place] CSRS was performed in accordance of the [facility] prior to dispensing any controlled drugs.  Clinical Course as of 05/19/21 1659  Sat May 19, 2021  1528 Patient presents emergency department with headache, ongoing neck pain and right upper extremity cervical radiculopathy.  Patient has been evaluated multiple times in the emergency department as well as by orthopedics for this posttraumatic injury that occurred 5 months ago.  Patient is neurologically intact at this time.  I feel that symptoms are likely tension type headache but will pursue imaging's as patient states that they have significantly worsened in the last 2 days. [JC]    Clinical Course User Index [JC] Adin Lariccia, Delorise Royals, PA-C          Patient's diagnosis is consistent with tension type headache.  Patient presents emergency department with worsening neck pain, worsening right upper extremity radiculopathy, headache.  Patient had a fall at work 5 months  ago, has on going symptoms from this injury.  Patient states that she had a sudden worsening without new trauma or other injury of her cervical radiculopathy as well as an associated headache.  She described this as radiating from her neck into the back of her head and behind her eyes.  I suspect a tension type headache from the description, ongoing neck problems, however given the sudden change in her symptoms, she was evaluated with CT scans.  These are reassuring at this time with no acute findings.  Patient will have symptom control medication here with migraine cocktail with Imitrex, Phenergan, Toradol and oral Robaxin for cervical muscle spasm.  Anti-inflammatory plus muscle relaxer at home.  Very limited prescription of Fioricet for any return of headache.  Follow-up with her orthopedic surgeons who are following her chronic pain following her fall 5 months ago.. Patient is given ED precautions to return to the ED for any worsening or new symptoms.     ____________________________________________  FINAL CLINICAL IMPRESSION(S) / ED DIAGNOSES  Final diagnoses:  Acute intractable tension-type headache  Cervical radiculopathy      NEW MEDICATIONS STARTED DURING THIS VISIT:  ED Discharge Orders          Ordered    meloxicam (MOBIC) 15 MG tablet  Daily        05/19/21 1659    methocarbamol (ROBAXIN) 500 MG tablet  4 times daily        05/19/21 1659    butalbital-acetaminophen-caffeine (FIORICET) 50-325-40 MG tablet  Every 6 hours PRN        05/19/21 1659                This chart was dictated using voice recognition software/Dragon. Despite best efforts to proofread, errors can occur which can change the meaning. Any change was purely unintentional.    Racheal Patches, PA-C 05/19/21 1700    Delton Prairie, MD 05/19/21 2000

## 2021-05-19 NOTE — ED Notes (Signed)
Patient with history of chronic pain in the right arm and shoulder and carpal tunnel in right hand, c/o worsening pain

## 2021-05-19 NOTE — ED Triage Notes (Signed)
Patient c/o right sided neck pain that radiates down her right arm to hand. C/o head pain around eyes also. Reports this has been going on for a month and worsened two days ago. Patient reports she fell at work 5 months ago and has continuing [employer] comp case.

## 2021-07-19 ENCOUNTER — Emergency Department
Admission: EM | Admit: 2021-07-19 | Discharge: 2021-07-19 | Disposition: A | Discharge status: Home / Self Care | Payer: Medicaid Other | Attending: Emergency Medicine | Admitting: Emergency Medicine

## 2021-07-19 ENCOUNTER — Encounter: Payer: Self-pay | Admitting: Emergency Medicine

## 2021-07-19 ENCOUNTER — Other Ambulatory Visit: Payer: Self-pay

## 2021-07-19 DIAGNOSIS — R1013 Epigastric pain: Secondary | ICD-10-CM | POA: Diagnosis not present

## 2021-07-19 DIAGNOSIS — Z20822 Contact with and (suspected) exposure to covid-19: Secondary | ICD-10-CM | POA: Diagnosis not present

## 2021-07-19 DIAGNOSIS — J029 Acute pharyngitis, unspecified: Secondary | ICD-10-CM | POA: Diagnosis not present

## 2021-07-19 LAB — URINALYSIS, ROUTINE W REFLEX MICROSCOPIC
Bilirubin Urine: NEGATIVE
Glucose, UA: NEGATIVE mg/dL
Hgb urine dipstick: NEGATIVE
Ketones, ur: 5 mg/dL — AB
Leukocytes,Ua: NEGATIVE
Nitrite: NEGATIVE
Protein, ur: 30 mg/dL — AB
Specific Gravity, Urine: 1.025 (ref 1.005–1.030)
pH: 5 (ref 5.0–8.0)

## 2021-07-19 LAB — GROUP A STREP BY PCR: Group A Strep by PCR: NOT DETECTED

## 2021-07-19 LAB — COMPREHENSIVE METABOLIC PANEL
ALT: 15 U/L (ref 0–44)
AST: 16 U/L (ref 15–41)
Albumin: 4 g/dL (ref 3.5–5.0)
Alkaline Phosphatase: 64 U/L (ref 38–126)
Anion gap: 6 (ref 5–15)
BUN: 11 mg/dL (ref 6–20)
CO2: 22 mmol/L (ref 22–32)
Calcium: 8.9 mg/dL (ref 8.9–10.3)
Chloride: 105 mmol/L (ref 98–111)
Creatinine, Ser: 0.86 mg/dL (ref 0.44–1.00)
GFR, Estimated: >60 mL/min (ref >60)
Glucose, Bld: 141 mg/dL — ABNORMAL HIGH (ref 70–99)
Potassium: 3.5 mmol/L (ref 3.5–5.1)
Sodium: 133 mmol/L — ABNORMAL LOW (ref 135–145)
Total Bilirubin: 0.9 mg/dL (ref 0.3–1.2)
Total Protein: 8 g/dL (ref 6.5–8.1)

## 2021-07-19 LAB — CBC
HCT: 44 % (ref 36.0–46.0)
Hemoglobin: 14.6 g/dL (ref 12.0–15.0)
MCH: 28.5 pg (ref 26.0–34.0)
MCHC: 33.2 g/dL (ref 30.0–36.0)
MCV: 85.8 fL (ref 80.0–100.0)
Platelets: 287 10*3/uL (ref 150–400)
RBC: 5.13 MIL/uL — ABNORMAL HIGH (ref 3.87–5.11)
RDW: 12.7 % (ref 11.5–15.5)
WBC: 6 10*3/uL (ref 4.0–10.5)
nRBC: 0 % (ref 0.0–0.2)

## 2021-07-19 LAB — RESP PANEL BY RT-PCR (FLU A&B, COVID) ARPGX2
Influenza A by PCR: NEGATIVE
Influenza B by PCR: NEGATIVE
SARS Coronavirus 2 by RT PCR: NEGATIVE

## 2021-07-19 LAB — TSH: TSH: 2.865 u[IU]/mL (ref 0.350–4.500)

## 2021-07-19 LAB — POC URINE PREG, ED: Preg Test, Ur: NEGATIVE

## 2021-07-19 LAB — LIPASE, BLOOD: Lipase: 34 U/L (ref 11–51)

## 2021-07-19 LAB — T4, FREE: Free T4: 0.72 ng/dL (ref 0.61–1.12)

## 2021-07-19 MED ORDER — ALUM & MAG HYDROXIDE-SIMETH 200-200-20 MG/5ML PO SUSP
15.0000 mL | Freq: Once | ORAL | Status: AC
  Administered 2021-07-19: 11:00:00 15 mL via ORAL
  Filled 2021-07-19: qty 30

## 2021-07-19 MED ORDER — SUCRALFATE 1 G PO TABS: 1.0000 g | ORAL_TABLET | Freq: Three times a day (TID) | ORAL | 0 refills | Status: DC

## 2021-07-19 MED ORDER — PANTOPRAZOLE SODIUM 40 MG PO TBEC
40.0000 mg | DELAYED_RELEASE_TABLET | Freq: Once | ORAL | Status: AC
  Administered 2021-07-19: 11:00:00 40 mg via ORAL
  Filled 2021-07-19: qty 1

## 2021-07-19 MED ORDER — SUCRALFATE 1 G PO TABS
1.0000 g | ORAL_TABLET | Freq: Once | ORAL | Status: AC
  Administered 2021-07-19: 11:00:00 1 g via ORAL
  Filled 2021-07-19: qty 1

## 2021-07-19 MED ORDER — PANTOPRAZOLE SODIUM 40 MG PO TBEC: 40.0000 mg | DELAYED_RELEASE_TABLET | Freq: Every day | ORAL | 0 refills | Status: DC

## 2021-07-19 NOTE — ED Triage Notes (Signed)
Pt comes into the ED via POV c/o swelling and a "knot" in her throat.  PT states this has been ongoing for 2 weeks.  Pt denies any known allergic reaction symptoms.  Pt denies any known h/o thyroid problems or goiters.  Pt also admits to headaches.  Pt neurologically intact at this time and is in NAD.  Pt has no swelling noted to the back of the throat and speech is clear.  Pt also c/o abd pain x2 weeks.

## 2021-07-19 NOTE — ED Provider Notes (Signed)
[facility] Emergency Department Provider Note  ____________________________________________   Event Date/Time   First MD Initiated Contact with Patient 07/19/21 [phone]     (approximate)  I have reviewed the triage vital signs and the nursing notes.   HISTORY  Chief Complaint Sore Throat and Abdominal Pain   HPI Teresa Meza is a 40 y.o. female with below noted past medical history who presents for assessment of 2 seemingly separate related subacute to chronic complaints.  First patient states she feels like she has had a knot in her throat for at least 2 or 3 weeks feels like it is radiating towards her jaw.  She states sometimes will feel like it is hard to swallow.  This did not change at all in the last couple days.  No associated fevers, inability tolerate p.o., headache, earache, eye pain or mouth pain.  No prior similar episodes.  Patient denies any chest pain, cough, shortness of breath or back pain but states that sometimes the pain will feel like it is rating up into her facial sinuses.  Second, she states that over the last 2 or 3 weeks but possibly longer she has had some pain in her upper and left side of her abdomen.  She denies any lower abdominal pain, nausea, vomiting, diarrhea, urinary symptoms, rash or back pain.  No recent falls or injuries.  She denies EtOH use, illicit drug use or tobacco abuse.  Denies any significant NSAID use.  Never has history of ulcers or gastritis.  No other acute concerns at this time.         Past Medical History:  Diagnosis Date   Ectopic fetus    Elderly multigravida 08/26/2017   [ ]  NIPT [ ]  NST/AFI weekly starting at 36 weeks   Elevated blood pressure reading without diagnosis of hypertension 02/10/2018    Patient Active Problem List   Diagnosis Date Noted   Encounter for induction of labor 02/14/2018   Abdominal wall cellulitis 02/11/2018   Abdominal pain 02/10/2018   Anemia 02/10/2018    Preeclampsia, third trimester 02/10/2018   Indication for care in labor and delivery, antepartum 01/24/2018   Pregnancy 01/24/2018   Cough 11/07/2017   Labor and delivery indication for care or intervention 10/24/2017   Advanced maternal age in multigravida, unspecified trimester 10/23/2017   Gestational diabetes mellitus (GDM) affecting fourth pregnancy 10/14/2017   BMI 37.0-37.9, adult 10/08/2017   Obesity in pregnancy 10/08/2017   Supervision of high risk pregnancy, antepartum 08/26/2017   Hx of gestational diabetes in prior pregnancy, currently pregnant 08/26/2017    Past Surgical History:  Procedure Laterality Date   ECTOPIC PREGNANCY SURGERY      Prior to Admission medications   Medication Sig Start Date End Date Taking? Authorizing Provider  pantoprazole (PROTONIX) 40 MG tablet Take 1 tablet (40 mg total) by mouth daily. 07/19/21 08/18/21 Yes 07/21/21, MD  sucralfate (CARAFATE) 1 g tablet Take 1 tablet (1 g total) by mouth 4 (four) times daily -  with meals and at bedtime for 5 days. 07/19/21 07/24/21 Yes 07/21/21, MD  butalbital-acetaminophen-caffeine (FIORICET) [phone] MG tablet Take 1 tablet by mouth every 6 (six) hours as needed for headache. 05/19/21 05/19/22  Cuthriell, 05/21/21, PA-C  meloxicam (MOBIC) 15 MG tablet Take 1 tablet (15 mg total) by mouth daily. 05/19/21   Cuthriell, Delorise Royals, PA-C  methocarbamol (ROBAXIN) 500 MG tablet Take 1 tablet (500 mg total) by mouth 4 (four) times  daily. 05/19/21   Cuthriell, Delorise Royals, PA-C    Allergies Sulfa antibiotics  Family History  Problem Relation Age of Onset   Diabetes Mellitus II Mother     Social History Social History   Tobacco Use   Smoking status: Never   Smokeless tobacco: Never  Substance Use Topics   Alcohol use: No   Drug use: No    Review of Systems  Review of Systems  Constitutional:  Negative for chills and fever.  HENT:  Positive for sore throat.   Eyes:  Negative for  pain.  Respiratory:  Negative for cough and stridor.   Cardiovascular:  Negative for chest pain.  Gastrointestinal:  Positive for abdominal pain. Negative for vomiting.  Musculoskeletal:  Positive for neck pain.  Skin:  Negative for rash.  Neurological:  Negative for seizures, loss of consciousness and headaches.  Psychiatric/Behavioral:  Negative for suicidal ideas.   All other systems reviewed and are negative.    ____________________________________________   PHYSICAL EXAM:  VITAL SIGNS: ED Triage Vitals  Enc Vitals Group     BP 07/19/21 0707 112/83     Pulse Rate 07/19/21 0707 99     Resp 07/19/21 0707 17     Temp 07/19/21 0707 98.3 F (36.8 C)     Temp Source 07/19/21 0707 Oral     SpO2 07/19/21 0707 93 %     Weight 07/19/21 0708 207 lb 14.3 oz (94.3 kg)     Height 07/19/21 0708 5' 3.5" (1.613 m)     Head Circumference --      Peak Flow --      Pain Score 07/19/21 0707 9     Pain Loc --      Pain Edu? --      Excl. in GC? --    Vitals:   07/19/21 0707 07/19/21 1027  BP: 112/83 117/82  Pulse: 99 85  Resp: 17 18  Temp: 98.3 F (36.8 C) 98.3 F (36.8 C)  SpO2: 93% 99%   Physical Exam Vitals and nursing note reviewed.  Constitutional:      General: She is not in acute distress.    Appearance: She is well-developed.  HENT:     Head: Normocephalic and atraumatic.     Right Ear: External ear normal.     Left Ear: External ear normal.     Nose: Nose normal.     Mouth/Throat:     Pharynx: Posterior oropharyngeal erythema present. No oropharyngeal exudate.  Eyes:     Conjunctiva/sclera: Conjunctivae normal.  Cardiovascular:     Rate and Rhythm: Normal rate and regular rhythm.     Heart sounds: No murmur heard. Pulmonary:     Effort: Pulmonary effort is normal. No respiratory distress.     Breath sounds: Normal breath sounds.  Abdominal:     Palpations: Abdomen is soft.     Tenderness: There is no abdominal tenderness.  Musculoskeletal:        General:  No swelling.     Cervical back: Neck supple. No rigidity.  Skin:    General: Skin is warm and dry.     Capillary Refill: Capillary refill takes less than 2 seconds.  Neurological:     Mental Status: She is alert.  Psychiatric:        Mood and Affect: Mood normal.    Posterior oropharyngeal erythema is noted.  There is no uvular deviation, exudates or significant tonsillar enlargement.  Patient has no stridor on auscultation of  her neck.  She is forage motion of her neck.  She does have some prominent anterior cervical lymphadenopathy versus enlargement of thyroid gland.  There is no overlying skin changes or brawniness.  Mild bilateral anterior cervical lymphadenopathy. ____________________________________________   LABS (all labs ordered are listed, but only abnormal results are displayed)  Labs Reviewed  COMPREHENSIVE METABOLIC PANEL - Abnormal; Notable for the following components:      Result Value   Sodium 133 (*)    Glucose, Bld 141 (*)    All other components within normal limits  CBC - Abnormal; Notable for the following components:   RBC 5.13 (*)    All other components within normal limits  URINALYSIS, ROUTINE W REFLEX MICROSCOPIC - Abnormal; Notable for the following components:   Color, Urine YELLOW (*)    APPearance HAZY (*)    Ketones, ur 5 (*)    Protein, ur 30 (*)    Bacteria, UA RARE (*)    All other components within normal limits  GROUP A STREP BY PCR  RESP PANEL BY RT-PCR (FLU A&B, COVID) ARPGX2  LIPASE, BLOOD  TSH  T4, FREE  POC URINE PREG, ED   ____________________________________________  EKG   ____________________________________________  RADIOLOGY  ED MD interpretation:    Official radiology report(s): No results found.  ____________________________________________   PROCEDURES  Procedure(s) performed (including Critical Care):  Procedures   ____________________________________________   INITIAL IMPRESSION / ASSESSMENT AND  PLAN / ED COURSE      Patient presents with above-stated history exam for assessment of some sore throat and neck discomfort as well as epigastric discomfort.  Both of the symptoms going on for least 2 or 3 weeks and possibly a little longer.  On arrival patient is afebrile and hemodynamically stable.  Her oropharynx is remarkable for some mild posterior oropharyngeal erythema but otherwise there is no evidence on exam of due space infection in the head or neck.  There is no stridor.  No evidence on history exam of retropharyngeal abscess, peritonsillar abscess, Ludewig's angina, mastoiditis patient does not appear septic.  I have a very low suspicion for neck abscess.  In addition to possible viral pharyngitis is possible she has some soreness from some anterior cervical lymphadenopathy as she has some enlarged lymph nodes on exam versus goiter.  She does not appear thyrotoxic and TSH and free T4 are within normal limits.  Strep screen is negative as is COVID and flu.  Given she does note difficulty swallowing and is not short of breath with stable vitals and overall duration of symptoms with low suspicion for immediate life-threatening process I think she is stable for continued outpatient evaluation of this.  With regard to the epigastric discomfort certainly possible she is refluxing causing some esophagitis, additional differential considerations include peptic ulcer disease, and gastritis.  Patient has no right upper quadrant tenderness or findings on her CMP to suggest a cholestatic process.  Lipase not consistent with pancreatitis.  CBC is unremarkable for leukocytosis or acute anemia.  Pregnancy test is negative and UA is unremarkable.  Patient given below noted GI cocktail and states she is feeling much better on my reassessment.  Given concern for possible peptic ulcer disease or gastritis I think it is reasonable to trial short course of Protonix and Carafate.  Rx given for this.  Advised close  outpatient PCP follow-up.  Discharged stable condition.  Strict and precautions advised and discussed.  ____________________________________________   FINAL CLINICAL IMPRESSION(S) / ED DIAGNOSES  Final diagnoses:  Pharyngitis, unspecified etiology  Epigastric pain    Medications  pantoprazole (PROTONIX) EC tablet 40 mg (40 mg Oral Given 07/19/21 1032)  sucralfate (CARAFATE) tablet 1 g (1 g Oral Given 07/19/21 1032)  alum & mag hydroxide-simeth (MAALOX/MYLANTA) 200-200-20 MG/5ML suspension 15 mL (15 mLs Oral Given 07/19/21 1032)     ED Discharge Orders          Ordered    pantoprazole (PROTONIX) 40 MG tablet  Daily        07/19/21 1227    sucralfate (CARAFATE) 1 g tablet  3 times daily with meals & bedtime        07/19/21 1227             Note:  This document was prepared using Dragon voice recognition software and may include unintentional dictation errors.    Gilles Chiquito, MD 07/19/21 1230

## 2021-07-19 NOTE — ED Notes (Signed)
Patient discharged to home per MD order. Patient in stable condition, and deemed medically cleared by ED provider for discharge. Discharge instructions reviewed with patient/family using "Teach Back"; verbalized understanding of medication education and administration, and information about follow-up care. Denies further concerns. ° °

## 2021-07-24 ENCOUNTER — Other Ambulatory Visit: Payer: Self-pay

## 2021-07-24 ENCOUNTER — Emergency Department: Payer: Medicaid Other

## 2021-07-24 ENCOUNTER — Emergency Department
Admission: EM | Admit: 2021-07-24 | Discharge: 2021-07-24 | Disposition: A | Discharge status: Home / Self Care | Payer: Medicaid Other | Attending: Emergency Medicine | Admitting: Emergency Medicine

## 2021-07-24 DIAGNOSIS — K0530 Chronic periodontitis, unspecified: Secondary | ICD-10-CM | POA: Diagnosis not present

## 2021-07-24 DIAGNOSIS — R0989 Other specified symptoms and signs involving the circulatory and respiratory systems: Secondary | ICD-10-CM

## 2021-07-24 DIAGNOSIS — F458 Other somatoform disorders: Secondary | ICD-10-CM | POA: Insufficient documentation

## 2021-07-24 LAB — CBC WITH DIFFERENTIAL/PLATELET
Abs Immature Granulocytes: 0.01 10*3/uL (ref 0.00–0.07)
Basophils Absolute: 0 10*3/uL (ref 0.0–0.1)
Basophils Relative: 1 %
Eosinophils Absolute: 0.1 10*3/uL (ref 0.0–0.5)
Eosinophils Relative: 2 %
HCT: 43 % (ref 36.0–46.0)
Hemoglobin: 14.4 g/dL (ref 12.0–15.0)
Immature Granulocytes: 0 %
Lymphocytes Relative: 36 %
Lymphs Abs: 1.9 10*3/uL (ref 0.7–4.0)
MCH: 29 pg (ref 26.0–34.0)
MCHC: 33.5 g/dL (ref 30.0–36.0)
MCV: 86.5 fL (ref 80.0–100.0)
Monocytes Absolute: 0.3 10*3/uL (ref 0.1–1.0)
Monocytes Relative: 5 %
Neutro Abs: 3.1 10*3/uL (ref 1.7–7.7)
Neutrophils Relative %: 56 %
Platelets: 299 10*3/uL (ref 150–400)
RBC: 4.97 MIL/uL (ref 3.87–5.11)
RDW: 12.8 % (ref 11.5–15.5)
WBC: 5.4 10*3/uL (ref 4.0–10.5)
nRBC: 0 % (ref 0.0–0.2)

## 2021-07-24 LAB — BASIC METABOLIC PANEL
Anion gap: 6 (ref 5–15)
BUN: 8 mg/dL (ref 6–20)
CO2: 26 mmol/L (ref 22–32)
Calcium: 9.2 mg/dL (ref 8.9–10.3)
Chloride: 103 mmol/L (ref 98–111)
Creatinine, Ser: 0.81 mg/dL (ref 0.44–1.00)
GFR, Estimated: >60 mL/min (ref >60)
Glucose, Bld: 106 mg/dL — ABNORMAL HIGH (ref 70–99)
Potassium: 3.6 mmol/L (ref 3.5–5.1)
Sodium: 135 mmol/L (ref 135–145)

## 2021-07-24 IMAGING — CR DG NECK SOFT TISSUE
2 series · 2 of 2 positions shown · non-contrast
Comparison: None.

CLINICAL DATA: Globus sensation

EXAM:
NECK SOFT TISSUES - 1+ VIEW

[neck lat]
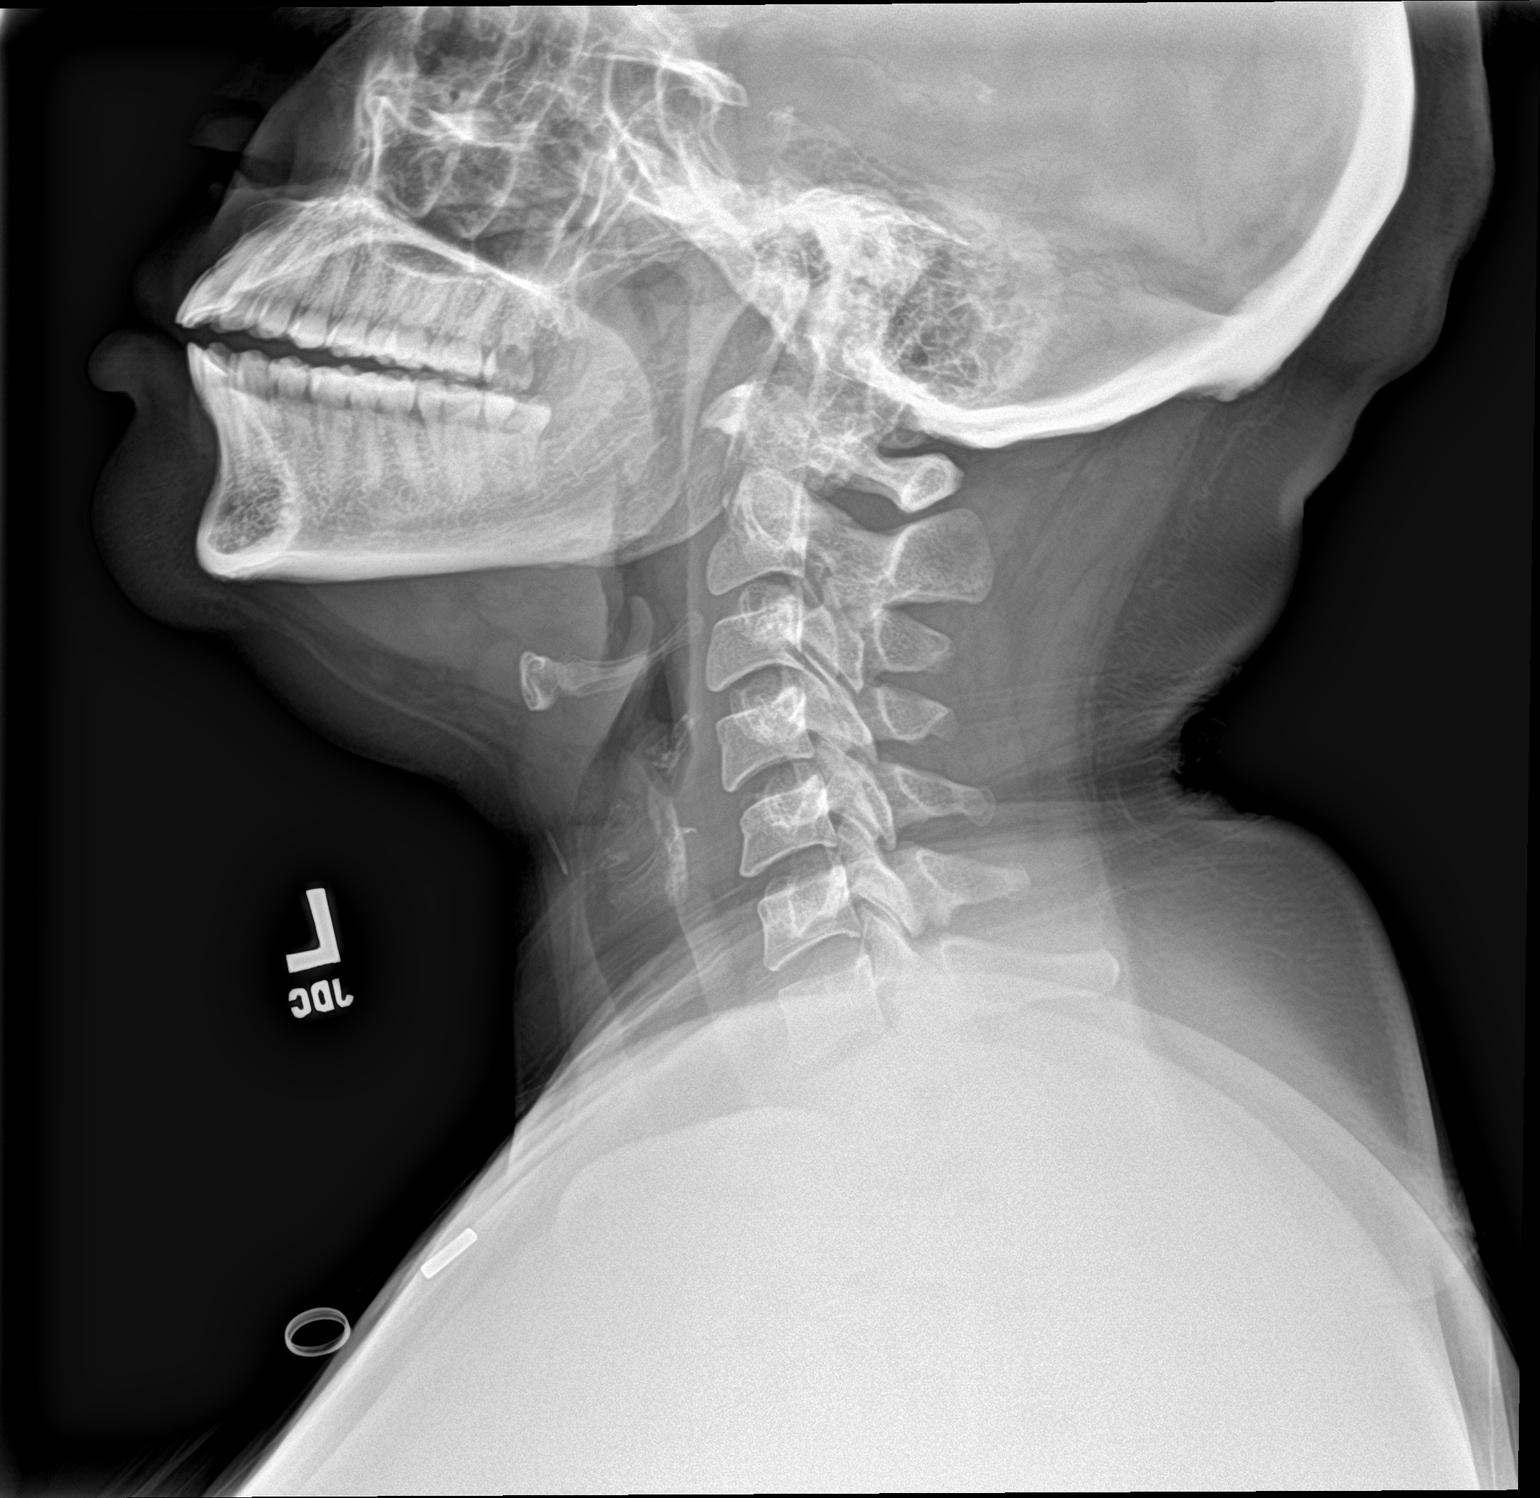

[neck ap]
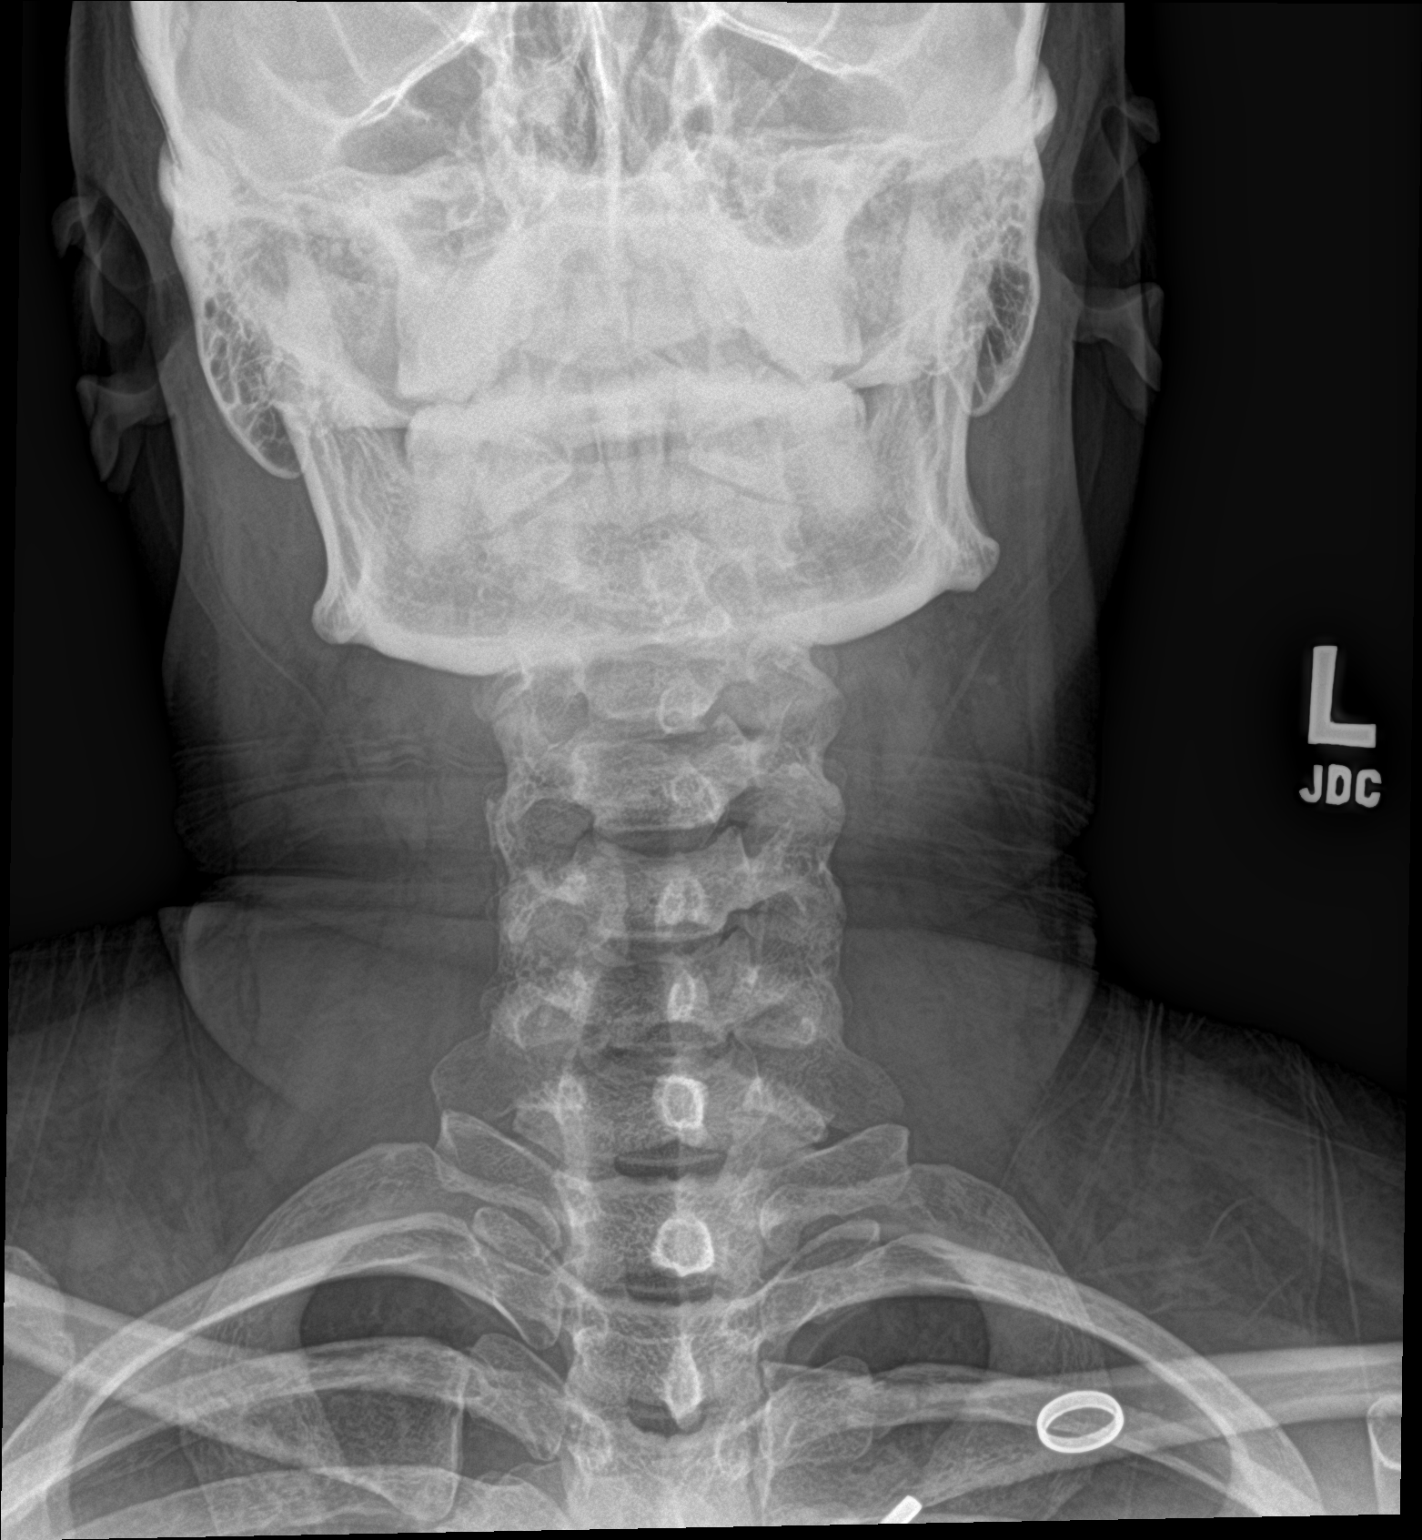

[2 of 2 positions shown; findings below may reference images not displayed]

FINDINGS: There is no evidence of retropharyngeal soft tissue swelling or
epiglottic enlargement. The cervical airway is unremarkable and no
radio-opaque foreign body identified.
IMPRESSION: No acute process identified.

## 2021-07-24 MED ORDER — CYCLOBENZAPRINE HCL 5 MG PO TABS: 5.0000 mg | ORAL_TABLET | Freq: Three times a day (TID) | ORAL | 0 refills | Status: DC | PRN

## 2021-07-24 MED ORDER — AMOXICILLIN 500 MG PO CAPS: 500.0000 mg | ORAL_CAPSULE | Freq: Three times a day (TID) | ORAL | 0 refills | Status: DC

## 2021-07-24 MED ORDER — GLUCAGON HCL RDNA (DIAGNOSTIC) 1 MG IJ SOLR
1.0000 mg | Freq: Once | INTRAMUSCULAR | Status: AC
  Administered 2021-07-24: 1 mg via INTRAVENOUS
  Filled 2021-07-24: qty 1

## 2021-07-24 MED ORDER — AMOXICILLIN 500 MG PO CAPS
500.0000 mg | ORAL_CAPSULE | Freq: Once | ORAL | Status: AC
  Administered 2021-07-24: 500 mg via ORAL
  Filled 2021-07-24: qty 1

## 2021-07-24 MED ORDER — ORPHENADRINE CITRATE 30 MG/ML IJ SOLN
60.0000 mg | INTRAMUSCULAR | Status: AC
  Administered 2021-07-24: 60 mg via INTRAMUSCULAR
  Filled 2021-07-24: qty 2

## 2021-07-24 MED ORDER — KETOROLAC TROMETHAMINE 30 MG/ML IJ SOLN
30.0000 mg | Freq: Once | INTRAMUSCULAR | Status: AC
  Administered 2021-07-24: 30 mg via INTRAVENOUS
  Filled 2021-07-24: qty 1

## 2021-07-24 NOTE — ED Provider Notes (Addendum)
[facility] Provider Note    Event Date/Time   First MD Initiated Contact with Patient 07/24/21 1556     (approximate)   History   Sore Throat   HPI  Teresa Meza is a 41 y.o. female presents to the ED for subsequent evaluation of ongoing lobus sensation to the throat.  Patient reports about 3 to 4 weeks of symptoms, and was evaluated in the ED on 12/29 for the same.  She was stable at that time, subsequently referred to outpatient urgent care for further evaluation.  She presents to the ED at their request, after presenting today for further evaluation.  Patient denies any swelling to the lips, tongue, or throat.  She notes her speech is clear, she is able to control oral secretions.  She does endorse however, sensation of swelling to the sublingual region and top of the throat.  Symptoms have been intermittent for the last several weeks.  She continues to be able to eat and drink without difficulty patient does not endorse any subsequent postprandial emesis, choking, gagging, or syncope.  Tenderness to the right mandible.  She localizes pain to the angle of the jaw at the right third wisdom tooth.  Denies any recent dental work.     Physical Exam   Triage Vital Signs: ED Triage Vitals  Enc Vitals Group     BP 07/24/21 1319 (!) 136/97     Pulse Rate 07/24/21 1319 94     Resp 07/24/21 1319 20     Temp 07/24/21 1319 98.7 F (37.1 C)     Temp Source 07/24/21 1319 Oral     SpO2 07/24/21 1319 98 %     Weight 07/24/21 1320 209 lb 7 oz (95 kg)     Height 07/24/21 1320 5\' 3"  (1.6 m)     Head Circumference --      Peak Flow --      Pain Score 07/24/21 1320 9     Pain Loc --      Pain Edu? --      Excl. in [place]? --     Most recent vital signs: Vitals:   07/24/21 1319  BP: (!) 136/97  Pulse: 94  Resp: 20  Temp: 98.7 F (37.1 C)  SpO2: 98%     General: Awake, no distress.  CV:  Good peripheral perfusion.  Resp:  Normal effort. CTA Abd:  No  distention. Soft, nontender ENT:  Uvula is midline and tonsils are flat.  No oropharyngeal lesions are appreciated.  Patient with a mild pericoronitis noted to the right mandible at the third molar.  No brawny sublingual edema is appreciated.  Patient without difficulty controlling oral secretions.  She is able to swallow without difficulty.  No palpable thyroid on exam.   ED Results / Procedures / Treatments   Labs (all labs ordered are listed, but only abnormal results are displayed) Labs Reviewed  BASIC METABOLIC PANEL - Abnormal; Notable for the following components:      Result Value   Glucose, Bld 106 (*)    All other components within normal limits  CBC WITH DIFFERENTIAL/PLATELET     EKG    RADIOLOGY  DG Soft Tissue Neck  IMPRESSION: No acute process identified.   PROCEDURES:  Critical Care performed: No  Procedures   MEDICATIONS ORDERED IN ED: Medications  glucagon (human recombinant) (GLUCAGEN) injection 1 mg (1 mg Intravenous Given 07/24/21 1721)  ketorolac (TORADOL) 30 MG/ML injection 30 mg (30  mg Intravenous Given 07/24/21 1923)  orphenadrine (NORFLEX) injection 60 mg (60 mg Intramuscular Given 07/24/21 1923)  amoxicillin (AMOXIL) capsule 500 mg (500 mg Oral Given 07/24/21 1923)     IMPRESSION / MDM / ASSESSMENT AND PLAN / ED COURSE  I reviewed the triage vital signs and the nursing notes.                              Differential diagnosis includes, but is not limited to, globus sensation, food bolus, pharyngitis, angioedema   Patient with ED evaluation of continued sense of knot in her throat.  Patient is evaluated for complaints in ED, found have a reassuring exam overall.  Patient without signs of acute airway obstruction, respiratory distress, or angioedema.  Stable to control oral secretions and speaking complete sentences.  Exam is also reassuring as it shows no acute oropharyngeal erythema.  Patient does have some mild pericarditis to the right lower  wisdom tooth.  Plain film soft tissue images of the neck reviewed by me, did not reveal any acute abnormalities.  CBC is normal and reassuring at this time as it shows no acute leukocytosis.  And BMP is negative for any acute electrolyte abnormality.  Patient does endorse improvement of her symptoms after IV glucagon bolus.  She again is stable at this time for outpatient management patient is being referred to GI medicine for further evaluation with either barium swallow or upper endoscopy if appropriate.  Patient be discharged with prescription for amoxicillin to treat a pericoronitis as well as a muscle relaxant for some ongoing musculoskeletal neck pain.  She will follow-up as discussed or return to the ED if necessary.    FINAL CLINICAL IMPRESSION(S) / ED DIAGNOSES   Final diagnoses:  Globus sensation  Pericoronitis     Rx / DC Orders   ED Discharge Orders          Ordered    cyclobenzaprine (FLEXERIL) 5 MG tablet  3 times daily PRN        07/24/21 1905    amoxicillin (AMOXIL) 500 MG capsule  3 times daily        07/24/21 1905             Note:  This document was prepared using Dragon voice recognition software and may include unintentional dictation errors.    Melvenia Needles, PA-C 07/24/21 [address] PA-C 07/24/21 Lona Kettle    Arta Silence, MD 07/24/21 2259

## 2021-07-24 NOTE — ED Notes (Signed)
MSE Jenise PA in triage 

## 2021-07-24 NOTE — Discharge Instructions (Addendum)
Your exam is overall reassuring at this time.  No signs of airway obstruction.  You reports some improvement after an IV dose of glucagon.  Take the antibiotic as prescribed, for the inflammation around the wisdom teeth.  Take the muscle relaxant as needed.  You should otherwise follow-up with a GI specialist for further evaluation of your complaint of intermittent throat tightness.  Return to the ED if needed.

## 2021-07-24 NOTE — ED Triage Notes (Signed)
Pt to ED from [facility], reports knot in throat for a few weeks. Was seen in ER for same on 12/29 Reports [facility] wants to have possible CT scan.  No swelling noted to facial area.  Clear speech, able to swallow saliva.   No orders at this time per Dr Katrinka Blazing

## 2021-07-24 NOTE — ED Provider Notes (Signed)
Emergency Medicine Provider Triage Evaluation Note  Teresa Meza, a 41 y.o. female  was evaluated in triage.  Pt complains of turn to the ED from [facility], for evaluation of a "knot in her throat."  Patient was evaluated in the ED 3 days ago for same complaint.  Work-up excluded any concern for focal abscess or toxic thyroid syndrome.  She was discharged for outpatient management of her symptoms.  She presents to the ED after presenting to [facility] as referred, for possible CT scan.  Patient denies any difficulty breathing, swallowing, or controlling oral secretions.  She again endorses some central upper pharyngeal fullness, and intermittent episodes of tightness sensation.  Review of Systems  Positive: Lump in throat Negative: NVD  Physical Exam  BP (!) 136/97    Pulse 94    Temp 98.7 F (37.1 C) (Oral)    Resp 20    Ht 5\' 3"  (1.6 m)    Wt 95 kg    SpO2 98%    BMI 37.10 kg/m  Gen:   Awake, no distress  NAD Resp:  Normal effort CTA MSK:   Moves extremities without difficulty  Other:  CVS: RRR  Medical Decision Making  Medically screening exam initiated at 1:52 PM.  Appropriate orders placed.  Teresa Meza was informed that the remainder of the evaluation will be completed by another provider, this initial triage assessment does not replace that evaluation, and the importance of remaining in the ED until their evaluation is complete.  Patient with subsequent ED evaluation of ongoing globus sensation to the throat.   , PA-C 07/24/21 1353    09/21/21, MD 07/24/21 1544

## 2021-07-31 ENCOUNTER — Other Ambulatory Visit: Payer: Self-pay

## 2021-08-01 ENCOUNTER — Other Ambulatory Visit: Payer: Self-pay

## 2021-08-01 ENCOUNTER — Ambulatory Visit (INDEPENDENT_AMBULATORY_CARE_PROVIDER_SITE_OTHER): Payer: Medicaid Other | Admitting: Gastroenterology

## 2021-08-01 ENCOUNTER — Encounter: Payer: Self-pay | Admitting: Gastroenterology

## 2021-08-01 VITALS — BP 122/79 | HR 99 | Temp 97.3°F

## 2021-08-01 DIAGNOSIS — R0989 Other specified symptoms and signs involving the circulatory and respiratory systems: Secondary | ICD-10-CM | POA: Diagnosis not present

## 2021-08-01 DIAGNOSIS — R1909 Other intra-abdominal and pelvic swelling, mass and lump: Secondary | ICD-10-CM

## 2021-08-01 NOTE — Progress Notes (Signed)
Arlyss Repress, MD [address]  [address]  [place], [place] [address]  Main: [phone]  Fax: [phone]    Gastroenterology Consultation  Referring Provider:     Ward, Elenora Fender, MD Primary Care Physician:  Ward, Elenora Fender, MD Primary Gastroenterologist:  Dr. Arlyss Repress Reason for Consultation:     Globus sensation        HPI:   Teresa Meza is a 41 y.o. female referred by Dr. Elesa Massed, Elenora Fender, MD  for consultation & management of globus sensation.  Patient is concerned about sensation of knot in her throat since December 2022.  Patient denies any trouble swallowing or sensation of food stuck in her throat.  Patient has been empirically started on Protonix 40 mg once daily since 07/19/2021 when she went to the ER.  She had an x-ray neck which was unrevealing.  Patient has significant physical impairment since she had a fall at work that resulted in significant weakness in her lower extremity.  She is undergoing physical therapy but with return of limited capacity to manage her ADLs.  Patient is also concerned about mass in her epigastric area that she notices has been getting worse.  She denies any other GI symptoms.  Patient came by herself today in wheelchair  NSAIDs: None  Antiplts/Anticoagulants/Anti thrombotics: None  GI Procedures: None  Past Medical History:  Diagnosis Date   Ectopic fetus    Elderly multigravida 08/26/2017   [ ]  NIPT [ ]  NST/AFI weekly starting at 36 weeks   Elevated blood pressure reading without diagnosis of hypertension 02/10/2018    Past Surgical History:  Procedure Laterality Date   ECTOPIC PREGNANCY SURGERY      Current Outpatient Medications:    acetaminophen (TYLENOL) 500 MG tablet, Take by mouth., Disp: , Rfl:    amoxicillin (AMOXIL) 500 MG capsule, Take 1 capsule (500 mg total) by mouth 3 (three) times daily., Disp: 30 capsule, Rfl: 0   acetaZOLAMIDE (DIAMOX) 250 MG tablet, acetazolamide 250 mg tablet  TAKE 1 TABLET  BY MOUTH THREE TIMES DAILY (Patient not taking: Reported on 08/01/2021), Disp: , Rfl:    cyclobenzaprine (FLEXERIL) 5 MG tablet, Take 1 tablet (5 mg total) by mouth 3 (three) times daily as needed. (Patient not taking: Reported on 08/01/2021), Disp: 15 tablet, Rfl: 0   gabapentin (NEURONTIN) 100 MG capsule, gabapentin 100 mg capsule  TAKE 1 CAPSULE BY MOUTH UP TO THREE TIMES DAILY (Patient not taking: Reported on 08/01/2021), Disp: , Rfl:    HYDROcodone-acetaminophen (NORCO/VICODIN) 5-325 MG tablet, Take 1 tablet by mouth every 6 (six) hours as needed. (Patient not taking: Reported on 08/01/2021), Disp: , Rfl:    meloxicam (MOBIC) 15 MG tablet, Take by mouth. (Patient not taking: Reported on 08/01/2021), Disp: , Rfl:    nortriptyline (PAMELOR) 10 MG capsule, Take 1 pill at night for one week then increase to 2 pills at night (Patient not taking: Reported on 08/01/2021), Disp: , Rfl:    pantoprazole (PROTONIX) 40 MG tablet, Take 1 tablet (40 mg total) by mouth daily. (Patient not taking: Reported on 08/01/2021), Disp: 30 tablet, Rfl: 0   sucralfate (CARAFATE) 1 g tablet, Take 1 tablet (1 g total) by mouth 4 (four) times daily -  with meals and at bedtime for 5 days. (Patient not taking: Reported on 08/01/2021), Disp: 20 tablet, Rfl: 0   tiZANidine (ZANAFLEX) 4 MG tablet, tizanidine 4 mg tablet  Take 1 tablet every 8 hours by oral  route. (Patient not taking: Reported on 08/01/2021), Disp: , Rfl:    traMADol (ULTRAM) 50 MG tablet, tramadol 50 mg tablet  TAKE 1 TABLET BY MOUTH EVERY 6 HOURS AS NEEDED (Patient not taking: Reported on 08/01/2021), Disp: , Rfl:    verapamil (CALAN) 80 MG tablet, Take by mouth. (Patient not taking: Reported on 08/01/2021), Disp: , Rfl:   Family History  Problem Relation Age of Onset   Diabetes Mellitus II Mother      Social History   Tobacco Use   Smoking status: Never   Smokeless tobacco: Never  Substance Use Topics   Alcohol use: No   Drug use: No    Allergies as of  08/01/2021 - Review Complete 08/01/2021  Allergen Reaction Noted   Sulfa antibiotics Hives, Itching, and Nausea Only 11/22/2015    Review of Systems:    All systems reviewed and negative except where noted in HPI.   Physical Exam:  BP 122/79 (BP Location: Left Arm, Patient Position: Sitting, Cuff Size: Large)    Pulse 99    Temp (!) 97.3 F (36.3 C) (Oral)  No LMP recorded. (Menstrual status: IUD).  General:   Alert,  Well-developed, well-nourished, pleasant and cooperative in NAD Head:  Normocephalic and atraumatic. Eyes:  Sclera clear, no icterus.   Conjunctiva pink. Ears:  Normal auditory acuity. Nose:  No deformity, discharge, or lesions. Mouth:  No deformity or lesions,oropharynx pink & moist. Neck:  Supple; no masses or thyromegaly. Lungs:  Respirations even and unlabored.  Clear throughout to auscultation.   No wheezes, crackles, or rhonchi. No acute distress. Heart:  Regular rate and rhythm; no murmurs, clicks, rubs, or gallops. Abdomen:  Normal bowel sounds. Soft, non-tender and non-distended, a mobile palpable subcutaneous lesion in the epigastric area, likely lipoma or a fat-containing ventral hernia noted.  No guarding or rebound tenderness.   Rectal: Not performed Msk:  Symmetrical without gross deformities. Good, equal movement & strength bilaterally. Pulses:  Normal pulses noted. Extremities:  No clubbing or edema.  No cyanosis. Neurologic:  Alert and oriented x3;  grossly normal neurologically. Skin:  Intact without significant lesions or rashes. No jaundice. Lymph Nodes:  No significant cervical adenopathy. Psych:  Alert and cooperative. Normal mood and affect.  Imaging Studies: Reviewed  Assessment and Plan:   Teresa Meza is a 41 y.o. female with history of chronic headache, idiopathic intracranial hypertension, s/p fall resulting in significant physical impairment, dependent on wheelchair is seen in consultation for sensation of knot in the throat.    Globus sensation Continue empiric Protonix 40 mg daily for now Recommend EGD for further evaluation  ?  Upper abdominal mass Recommend referral to general surgery for further evaluation   Follow up after EGD   Cephas Darby, MD

## 2021-08-06 ENCOUNTER — Encounter: Payer: Self-pay | Admitting: Gastroenterology

## 2021-08-07 ENCOUNTER — Ambulatory Visit: Payer: Self-pay | Admitting: Family Medicine

## 2021-08-07 ENCOUNTER — Encounter: Payer: Self-pay | Admitting: Gastroenterology

## 2021-08-07 ENCOUNTER — Ambulatory Visit: Payer: Medicaid Other | Admitting: Certified Registered"

## 2021-08-07 ENCOUNTER — Ambulatory Visit
Admission: RE | Admit: 2021-08-07 | Discharge: 2021-08-07 | Disposition: A | Discharge status: Home / Self Care | Payer: Medicaid Other | Attending: Gastroenterology | Admitting: Gastroenterology

## 2021-08-07 ENCOUNTER — Encounter
Admission: RE | Disposition: A | Discharge status: Home / Self Care | Payer: Self-pay | Source: Home / Self Care | Attending: Gastroenterology

## 2021-08-07 DIAGNOSIS — K3189 Other diseases of stomach and duodenum: Secondary | ICD-10-CM | POA: Insufficient documentation

## 2021-08-07 DIAGNOSIS — K219 Gastro-esophageal reflux disease without esophagitis: Secondary | ICD-10-CM | POA: Diagnosis not present

## 2021-08-07 DIAGNOSIS — R0989 Other specified symptoms and signs involving the circulatory and respiratory systems: Secondary | ICD-10-CM | POA: Diagnosis not present

## 2021-08-07 DIAGNOSIS — Z79899 Other long term (current) drug therapy: Secondary | ICD-10-CM | POA: Diagnosis not present

## 2021-08-07 DIAGNOSIS — R09A2 Foreign body sensation, throat: Secondary | ICD-10-CM

## 2021-08-07 DIAGNOSIS — B9681 Helicobacter pylori [H. pylori] as the cause of diseases classified elsewhere: Secondary | ICD-10-CM | POA: Insufficient documentation

## 2021-08-07 DIAGNOSIS — Q394 Esophageal web: Secondary | ICD-10-CM

## 2021-08-07 DIAGNOSIS — I10 Essential (primary) hypertension: Secondary | ICD-10-CM | POA: Insufficient documentation

## 2021-08-07 DIAGNOSIS — K319 Disease of stomach and duodenum, unspecified: Secondary | ICD-10-CM

## 2021-08-07 DIAGNOSIS — F458 Other somatoform disorders: Secondary | ICD-10-CM | POA: Diagnosis present

## 2021-08-07 DIAGNOSIS — K2950 Unspecified chronic gastritis without bleeding: Secondary | ICD-10-CM | POA: Diagnosis not present

## 2021-08-07 HISTORY — PX: ESOPHAGOGASTRODUODENOSCOPY (EGD) WITH PROPOFOL: SHX5813

## 2021-08-07 LAB — GLUCOSE, CAPILLARY: Glucose-Capillary: 123 mg/dL — ABNORMAL HIGH (ref 70–99)

## 2021-08-07 LAB — POCT PREGNANCY, URINE: Preg Test, Ur: NEGATIVE

## 2021-08-07 SURGERY — ESOPHAGOGASTRODUODENOSCOPY (EGD) WITH PROPOFOL
Anesthesia: General

## 2021-08-07 MED ORDER — LIDOCAINE HCL (PF) 1 % IJ SOLN
INTRAMUSCULAR | Status: AC
  Filled 2021-08-07: qty 2

## 2021-08-07 MED ORDER — LIDOCAINE HCL (CARDIAC) PF 100 MG/5ML IV SOSY
PREFILLED_SYRINGE | INTRAVENOUS | Status: DC | PRN
  Administered 2021-08-07: 100 mg via INTRAVENOUS

## 2021-08-07 MED ORDER — SODIUM CHLORIDE 0.9 % IV SOLN: INTRAVENOUS | Status: DC

## 2021-08-07 MED ORDER — PROPOFOL 10 MG/ML IV BOLUS
INTRAVENOUS | Status: DC | PRN
  Administered 2021-08-07: 60 mg via INTRAVENOUS
  Administered 2021-08-07: 20 mg via INTRAVENOUS

## 2021-08-07 MED ORDER — GLYCOPYRROLATE 0.2 MG/ML IJ SOLN
INTRAMUSCULAR | Status: DC | PRN
  Administered 2021-08-07: .2 mg via INTRAVENOUS

## 2021-08-07 MED ORDER — PROPOFOL 500 MG/50ML IV EMUL
INTRAVENOUS | Status: DC | PRN
Start: 2021-08-07 — End: 2021-08-07
  Administered 2021-08-07: 165 ug/kg/min via INTRAVENOUS

## 2021-08-07 NOTE — Anesthesia Procedure Notes (Signed)
Procedure Name: General with mask airway Date/Time: 08/07/2021 8:21 AM Performed by: Kelton Pillar, CRNA Pre-anesthesia Checklist: Patient identified, Emergency Drugs available, Suction available, Patient being monitored and Timeout performed Patient Re-evaluated:Patient Re-evaluated prior to induction Oxygen Delivery Method: Simple face mask Induction Type: IV induction Placement Confirmation: positive ETCO2 and CO2 detector Dental Injury: Teeth and Oropharynx as per pre-operative assessment

## 2021-08-07 NOTE — Anesthesia Preprocedure Evaluation (Signed)
Anesthesia Evaluation  Patient identified by MRN, date of birth, ID band Patient awake    Reviewed: Allergy & Precautions, H&P , NPO status , Patient's Chart, lab work & pertinent test results, reviewed documented beta blocker date and time   History of Anesthesia Complications Negative for: history of anesthetic complications  Airway Mallampati: II  TM Distance: >3 FB Neck ROM: full    Dental  (+) Dental Advidsory Given, Teeth Intact   Pulmonary neg pulmonary ROS,    Pulmonary exam normal breath sounds clear to auscultation       Cardiovascular Exercise Tolerance: Good hypertension, (-) angina(-) Past MI and (-) Cardiac Stents Normal cardiovascular exam(-) dysrhythmias (-) Valvular Problems/Murmurs Rhythm:regular Rate:Normal     Neuro/Psych negative neurological ROS  negative psych ROS   GI/Hepatic Neg liver ROS, GERD  ,  Endo/Other  diabetes, Gestational  Renal/GU negative Renal ROS  negative genitourinary   Musculoskeletal   Abdominal   Peds  Hematology negative hematology ROS (+)   Anesthesia Other Findings Past Medical History: 10/23/2017: Advanced maternal age in multigravida, unspecified trimester No date: Ectopic fetus 08/26/2017: Elderly multigravida     Comment:  ( ) NIPT ( ) NST/AFI weekly starting at 36 weeks 02/10/2018: Elevated blood pressure reading without diagnosis of  hypertension 10/14/2017: Gestational diabetes mellitus (GDM) affecting fourth  pregnancy 08/26/2017: Hx of gestational diabetes in prior pregnancy, currently  pregnant     Comment:  ( ) early 1GTT 01/24/2018: Indication for care in labor and delivery, antepartum 10/24/2017: Labor and delivery indication for care or intervention 10/08/2017: Obesity in pregnancy 02/10/2018: Preeclampsia, third trimester 08/26/2017: Supervision of high risk pregnancy, antepartum     Comment:    [facility] Prenatal Labs  Dating  6wk [place] Blood                type: --/--/B POS  Genetic Screen  NIPS: Normal XY                 Antibody:Negative (02/05 1516)  Anatomic [place]  Rubella:               4.25 (02/05 1516) Varicella: Immune  GTT Early: 1hr:189                28 wk:      RPR: Non Reactive (02/05 1516)   Rhogam  Not               needed HBsAg: Negative (02/05 1516)   TDaP vaccine                     HIV: Non Reactive (02/05 1516)   Flu Shot   Dec   Reproductive/Obstetrics negative OB ROS                             Anesthesia Physical Anesthesia Plan  ASA: 2  Anesthesia Plan: General   Post-op Pain Management:    Induction: Intravenous  PONV Risk Score and Plan: 3 and Propofol infusion and TIVA  Airway Management Planned: Natural Airway and Nasal Cannula  Additional Equipment:   Intra-op Plan:   Post-operative Plan:   Informed Consent: I have reviewed the patients History and Physical, chart, labs and discussed the procedure including the risks, benefits and alternatives for the proposed anesthesia with the patient or authorized representative who has indicated his/her understanding and acceptance.     Dental Advisory Given  Plan Discussed with: Anesthesiologist,  CRNA and Surgeon  Anesthesia Plan Comments:         Anesthesia Quick Evaluation

## 2021-08-07 NOTE — H&P (Signed)
Arlyss Repress, MD [address]  [address]  [place], [place] [address]  Main: [phone]  Fax: [phone] Pager: [phone]  Primary Care Physician:  [facility] Primary Gastroenterologist:  Dr. Arlyss Repress  Pre-Procedure History & Physical: HPI:  Teresa Meza is a 41 y.o. female is here for an endoscopy.   Past Medical History:  Diagnosis Date   Advanced maternal age in multigravida, unspecified trimester 10/23/2017   Ectopic fetus    Elderly multigravida 08/26/2017   [ ]  NIPT [ ]  NST/AFI weekly starting at 36 weeks   Elevated blood pressure reading without diagnosis of hypertension 02/10/2018   Gestational diabetes mellitus (GDM) affecting fourth pregnancy 10/14/2017   Hx of gestational diabetes in prior pregnancy, currently pregnant 08/26/2017   [ ]  early 1GTT   Indication for care in labor and delivery, antepartum 01/24/2018   Labor and delivery indication for care or intervention 10/24/2017   Obesity in pregnancy 10/08/2017   Preeclampsia, third trimester 02/10/2018   Supervision of high risk pregnancy, antepartum 08/26/2017     [facility] Prenatal Labs  Dating  6wk 10/10/2017 Blood type: --/--/B POS  Genetic Screen  NIPS: Normal XY   Antibody:Negative (02/05 1516)  Anatomic 10/24/2017  Rubella: 4.25 (02/05 1516) Varicella: Immune  GTT Early: 1hr:189       28 wk:      RPR: Non Reactive (02/05 1516)   Rhogam  Not needed HBsAg: Negative (02/05 1516)   TDaP vaccine                       HIV: Non Reactive (02/05 1516)   Flu Shot   Dec    Past Surgical History:  Procedure Laterality Date   ECTOPIC PREGNANCY SURGERY      Prior to Admission medications   Medication Sig Start Date End Date Taking? Authorizing Provider  amoxicillin (AMOXIL) 500 MG capsule Take 1 capsule (500 mg total) by mouth 3 (three) times daily. 07/24/21  Yes Menshew, 03-05-1991, PA-C  acetaminophen (TYLENOL) 500 MG tablet Take by mouth.    [provider]  acetaZOLAMIDE (DIAMOX) 250 MG tablet  acetazolamide 250 mg tablet  TAKE 1 TABLET BY MOUTH THREE TIMES DAILY Patient not taking: Reported on 08/01/2021    [provider]  cyclobenzaprine (FLEXERIL) 5 MG tablet Take 1 tablet (5 mg total) by mouth 3 (three) times daily as needed. Patient not taking: Reported on 08/01/2021 07/24/21   Menshew, 09/29/2021, PA-C  gabapentin (NEURONTIN) 100 MG capsule gabapentin 100 mg capsule  TAKE 1 CAPSULE BY MOUTH UP TO THREE TIMES DAILY Patient not taking: Reported on 08/01/2021 06/28/20   [provider]  HYDROcodone-acetaminophen (NORCO/VICODIN) 5-325 MG tablet Take 1 tablet by mouth every 6 (six) hours as needed. Patient not taking: Reported on 08/01/2021 03/18/21   [provider]  meloxicam (MOBIC) 15 MG tablet Take by mouth. Patient not taking: Reported on 08/01/2021 03/18/21   [provider]  nortriptyline (PAMELOR) 10 MG capsule Take 1 pill at night for one week then increase to 2 pills at night Patient not taking: Reported on 08/01/2021 07/29/19   [provider]  pantoprazole (PROTONIX) 40 MG tablet Take 1 tablet (40 mg total) by mouth daily. Patient not taking: Reported on 08/01/2021 07/19/21 08/18/21  09/29/2021, MD  sucralfate (CARAFATE) 1 g tablet Take 1 tablet (1 g total) by mouth 4 (four) times daily -  with meals and at  bedtime for 5 days. Patient not taking: Reported on 08/01/2021 07/19/21 07/24/21  Gilles Chiquito, MD  tiZANidine (ZANAFLEX) 4 MG tablet tizanidine 4 mg tablet  Take 1 tablet every 8 hours by oral route. Patient not taking: Reported on 08/01/2021    [provider]  traMADol (ULTRAM) 50 MG tablet tramadol 50 mg tablet  TAKE 1 TABLET BY MOUTH EVERY 6 HOURS AS NEEDED Patient not taking: Reported on 08/01/2021    [provider]  verapamil (CALAN) 80 MG tablet Take by mouth. Patient not taking: Reported on 08/01/2021 08/30/20 08/30/21  [provider]    Allergies as of 08/01/2021 - Review Complete  08/01/2021  Allergen Reaction Noted   Sulfa antibiotics Hives, Itching, and Nausea Only 11/22/2015    Family History  Problem Relation Age of Onset   Diabetes Mellitus II Mother     Social History   Socioeconomic History   Marital status: Married    Spouse name: Not on file   Number of children: Not on file   Years of education: Not on file   Highest education level: Not on file  Occupational History   Not on file  Tobacco Use   Smoking status: Never   Smokeless tobacco: Never  Vaping Use   Vaping Use: Never used  Substance and Sexual Activity   Alcohol use: No   Drug use: No   Sexual activity: Yes    Birth control/protection: I.U.D.  Other Topics Concern   Not on file  Social History Narrative   Not on file   Social Determinants of Health   Financial Resource Strain: Not on file  Food Insecurity: Not on file  Transportation Needs: Not on file  Physical Activity: Not on file  Stress: Not on file  Social Connections: Not on file  Intimate Partner Violence: Not on file    Review of Systems: See HPI, otherwise negative ROS  Physical Exam: BP 132/88    Pulse 85    Temp (!) 96 F (35.6 C) (Temporal)    Resp 16    Ht 5' 3.5" (1.613 m)    Wt 98.4 kg    SpO2 98%    BMI 37.84 kg/m  General:   Alert,  pleasant and cooperative in NAD Head:  Normocephalic and atraumatic. Neck:  Supple; no masses or thyromegaly. Lungs:  Clear throughout to auscultation.    Heart:  Regular rate and rhythm. Abdomen:  Soft, nontender and nondistended. Normal bowel sounds, without guarding, and without rebound.   Neurologic:  Alert and  oriented x4;  grossly normal neurologically.  Impression/Plan: Teresa Meza is here for an endoscopy to be performed for globus sensation  Risks, benefits, limitations, and alternatives regarding  endoscopy have been reviewed with the patient.  Questions have been answered.  All parties agreeable.   Lannette Donath, MD  08/07/2021, 7:52 AM

## 2021-08-07 NOTE — Op Note (Signed)
[facility] Gastroenterology Patient Name: Teresa BrownieLatosha Mccrae Procedure Date: 08/07/2021 8:03 AM MRN: [id number] Account #: [id number] Date of Birth: 24-Dec-1980 Admit Type: Outpatient Age: 41 [facility]OM 3 Gender: Female Note Status: Finalized Instrument Name: Laurette SchimkeUpper-Endoscope [id number] Procedure:             Upper GI endoscopy Indications:           Globus sensation Providers:             Toney Reilohini Reddy Ivory Maduro MD, MD Medicines:             General Anesthesia Complications:         No immediate complications. Estimated blood loss: None. Procedure:             Pre-Anesthesia Assessment:                        - Prior to the procedure, a History and Physical was                         performed, and patient medications and allergies were                         reviewed. The patient is competent. The risks and                         benefits of the procedure and the sedation options and                         risks were discussed with the patient. All questions                         were answered and informed consent was obtained.                         Patient identification and proposed procedure were                         verified by the physician, the nurse, the                         anesthesiologist, the anesthetist and the technician                         in the pre-procedure area in the procedure room in the                         endoscopy suite. Mental Status Examination: alert and                         oriented. Airway Examination: normal oropharyngeal                         airway and neck mobility. Respiratory Examination:                         clear to auscultation. CV Examination: normal.                         Prophylactic Antibiotics: The patient  does not require                         prophylactic antibiotics. Prior Anticoagulants: The                         patient has taken no previous anticoagulant or                          antiplatelet agents. ASA Grade Assessment: II - A                         patient with mild systemic disease. After reviewing                         the risks and benefits, the patient was deemed in                         satisfactory condition to undergo the procedure. The                         anesthesia plan was to use general anesthesia.                         Immediately prior to administration of medications,                         the patient was re-assessed for adequacy to receive                         sedatives. The heart rate, respiratory rate, oxygen                         saturations, blood pressure, adequacy of pulmonary                         ventilation, and response to care were monitored                         throughout the procedure. The physical status of the                         patient was re-assessed after the procedure.                        After obtaining informed consent, the endoscope was                         passed under direct vision. Throughout the procedure,                         the patient's blood pressure, pulse, and oxygen                         saturations were monitored continuously. The Endoscope                         was introduced through the mouth, and advanced to the  second part of duodenum. The upper GI endoscopy was                         accomplished without difficulty. The patient tolerated                         the procedure well. Findings:      The duodenal bulb and second portion of the duodenum were normal.      Diffuse mildly erythematous mucosa without bleeding was found in the       gastric fundus, in the gastric body and in the gastric antrum. Biopsies       were taken with a cold forceps for Helicobacter pylori testing.      The cardia and gastric fundus were normal on retroflexion.      Esophagogastric landmarks were identified: the gastroesophageal junction       was found at 35 cm  from the incisors.      A web was found in the lower third of the esophagus at 30. A TTS dilator       was passed through the scope. Dilation with a 15-16.5-18 mm x 8 cm CRE       balloon and an 18-19-20 mm x 8 cm CRE balloon dilator was performed to       20 mm. The dilation site was examined following endoscope reinsertion       and showed mild mucosal disruption. This was biopsied with a cold       forceps for histology. Impression:            - Normal duodenal bulb and second portion of the                         duodenum.                        - Erythematous mucosa in the gastric fundus, gastric                         body and antrum. Biopsied.                        - Esophagogastric landmarks identified.                        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 Dilated.                         Biopsied. Recommendation:        - Await pathology results.                        - Discharge patient to home (with escort).                        - Resume previous diet today.                        - Continue present medications. Procedure Code(s):     --- Professional ---                        [phone], Esophagogastroduodenoscopy, flexible,  transoral; with transendoscopic balloon dilation of                         esophagus (less than 30 mm diameter)                        43239, 59, Esophagogastroduodenoscopy, flexible,                         transoral; with biopsy, single or multiple Diagnosis Code(s):     --- Professional ---                        Q39.4, Esophageal web                        K31.89, Other diseases of stomach and duodenum                        F45.8, Other somatoform disorders CPT copyright 2019 American [employer] Association. All rights reserved. The codes documented in this report are preliminary and upon coder review may  be revised to meet current compliance requirements. Dr. Libby Maw Toney Reil MD, MD 08/07/2021  8:44:21 AM This report has been signed electronically. Number of Addenda: 0 Note Initiated On: 08/07/2021 8:03 AM Estimated Blood Loss:  Estimated blood loss: none.      [facility]

## 2021-08-07 NOTE — Transfer of Care (Signed)
Immediate Anesthesia Transfer of Care Note  Patient: Teresa Meza  Procedure(s) Performed: ESOPHAGOGASTRODUODENOSCOPY (EGD) WITH PROPOFOL  Patient Location: Endoscopy Unit  Anesthesia Type:General  Level of Consciousness: drowsy  Airway & Oxygen Therapy: Patient Spontanous Breathing and Patient connected to face mask oxygen  Post-op Assessment: Report given to RN and Post -op Vital signs reviewed and stable  Post vital signs: Reviewed and stable  Last Vitals:  Vitals Value Taken Time  BP 130/82 08/07/21 0850  Temp 36.2 C 08/07/21 0850  Pulse 115 08/07/21 0851  Resp 19 08/07/21 0851  SpO2 100 % 08/07/21 0851  Vitals shown include unvalidated device data.  Last Pain:  Vitals:   08/07/21 0850  TempSrc: Temporal  PainSc: Asleep         Complications: No notable events documented.

## 2021-08-08 ENCOUNTER — Encounter: Payer: Self-pay | Admitting: Gastroenterology

## 2021-08-08 LAB — SURGICAL PATHOLOGY

## 2021-08-09 ENCOUNTER — Other Ambulatory Visit: Payer: Self-pay

## 2021-08-09 ENCOUNTER — Telehealth: Payer: Self-pay

## 2021-08-09 ENCOUNTER — Encounter: Payer: Self-pay | Admitting: Surgery

## 2021-08-09 ENCOUNTER — Ambulatory Visit: Payer: Medicaid Other | Admitting: Surgery

## 2021-08-09 VITALS — BP 110/80 | HR 103 | Temp 99.2°F | Ht 63.5 in | Wt 217.0 lb

## 2021-08-09 DIAGNOSIS — R1084 Generalized abdominal pain: Secondary | ICD-10-CM

## 2021-08-09 DIAGNOSIS — R1011 Right upper quadrant pain: Secondary | ICD-10-CM | POA: Diagnosis not present

## 2021-08-09 DIAGNOSIS — A048 Other specified bacterial intestinal infections: Secondary | ICD-10-CM

## 2021-08-09 MED ORDER — OMEPRAZOLE 20 MG PO CPDR: 20.0000 mg | DELAYED_RELEASE_CAPSULE | Freq: Two times a day (BID) | ORAL | 0 refills | Status: DC

## 2021-08-09 MED ORDER — CLARITHROMYCIN 500 MG PO TABS: 500.0000 mg | ORAL_TABLET | Freq: Two times a day (BID) | ORAL | 0 refills | Status: AC

## 2021-08-09 MED ORDER — AMOXICILLIN 500 MG PO TABS: 1000.0000 mg | ORAL_TABLET | Freq: Two times a day (BID) | ORAL | 0 refills | Status: AC

## 2021-08-09 NOTE — Telephone Encounter (Signed)
-----   Message from Toney Reil, MD sent at 08/08/2021  4:23 PM EST ----- Teresa Meza  Please inform patient that she is tested positive for H. pylori which explains her epigastric pain and we can hold off on referral to general surgery for cholecystectomy at this time.  Plz send her the prescription for triple therapy to treat H Pylori for 14days  Omeprazole 20mg  BID Clarithromycin 500mg  BID Amoxicillin 1gm BID  Order H Pylori breath test in 4weeks after completing medication to confirm eradication. She should be off prilosec and H2 blocker atleast for 2weeks before the test  Thanks RV

## 2021-08-09 NOTE — Telephone Encounter (Signed)
Patient verbalized understanding  of advice

## 2021-08-09 NOTE — Telephone Encounter (Signed)
Her stomach issues are secondary to H Pylori. Globus sensation likely secondary to her spine issues. Sorry, I got confused with another pt about gall bladder. Yes, she will be seeing a surgeon for possible abdominal mass  RV
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
## 2021-08-09 NOTE — Patient Instructions (Addendum)
Advised to pursue a goal of 25 to 30 g of fiber daily.  Made aware that the majority of this may be through natural sources, but advised to be aware of actual consumption and to ensure minimal consumption by daily supplementation.  Various forms of supplements discussed.  Strongly advised to consume more fluids to ensure adequate hydration, instructed to watch color of urine to determine adequacy of hydration.  Clarity is pursued in urine output, and bowel activity that correlates to significant meal intake.  Patient is to avoid deferring having bowel movements, advised to take the time at the first sign of sensation, typically following meals and in the morning.  Subsequent utilization of MiraLAX to ensure at least daily movement, ideally twice daily bowel movements.  If multiple doses of MiraLAX are necessary utilize them. May take fiber supplement Psyllium fiber, Benefiber, or fiber gummies.   Ideally you should have 1-2 easy bowel movements a day.    We will get you set up for an Ultrasound of the gallbladder and a CT scan of the abdomen and pelvis with contrast.   You are scheduled for an Ultrasound and CT scan at [facility] [place] [facility] on 08/30/21. You will arrive there by 7:30 am and have nothing to eat or drink after midnight the night prior. You will need to pick up a prep kit for this exam and bring it with you to the exam.   We will have you return to the office after we get the results.

## 2021-08-09 NOTE — Telephone Encounter (Signed)
Patient verbalized understanding of instructions and sent medications to the pharmacy. She will come back for for h pylori breath test. She also wants to know if her fall could of cause her dysphagia and all her stomach issues. She states she has appointment with the surgeon today and is going to go see them to see what they say

## 2021-08-09 NOTE — Telephone Encounter (Signed)
Needed a note for work for sat printed it and gave it to patient

## 2021-08-13 NOTE — Anesthesia Postprocedure Evaluation (Signed)
Anesthesia Post Note  Patient: Teresa Meza  Procedure(s) Performed: ESOPHAGOGASTRODUODENOSCOPY (EGD) WITH PROPOFOL  Patient location during evaluation: Endoscopy Anesthesia Type: General Level of consciousness: awake and alert Pain management: pain level controlled Vital Signs Assessment: post-procedure vital signs reviewed and stable Respiratory status: spontaneous breathing, nonlabored ventilation, respiratory function stable and patient connected to nasal cannula oxygen Cardiovascular status: blood pressure returned to baseline and stable Postop Assessment: no apparent nausea or vomiting Anesthetic complications: no   No notable events documented.   Last Vitals:  Vitals:   08/07/21 0910 08/07/21 0920  BP: 134/89 137/85  Pulse: (!) 103 93  Resp: 19 (!) 22  Temp:    SpO2: 98% 100%    Last Pain:  Vitals:   08/07/21 0920  TempSrc:   PainSc: 0-No pain                 Lenard Simmer

## 2021-08-15 ENCOUNTER — Ambulatory Visit: Payer: Self-pay | Admitting: Family Medicine

## 2021-08-29 ENCOUNTER — Other Ambulatory Visit: Payer: Self-pay

## 2021-08-29 ENCOUNTER — Ambulatory Visit: Payer: Medicaid Other

## 2021-08-29 ENCOUNTER — Ambulatory Visit
Admission: RE | Admit: 2021-08-29 | Discharge: 2021-08-29 | Disposition: A | Discharge status: Home / Self Care | Payer: Medicaid Other | Source: Ambulatory Visit | Attending: Surgery | Admitting: Surgery

## 2021-08-29 DIAGNOSIS — R1011 Right upper quadrant pain: Secondary | ICD-10-CM | POA: Diagnosis not present

## 2021-08-29 IMAGING — US US ABDOMEN LIMITED
1 series · 14 of 25 positions shown · non-contrast
Comparison: None.

CLINICAL DATA: Right upper quadrant abdominal pain

EXAM:
ULTRASOUND ABDOMEN LIMITED RIGHT UPPER QUADRANT

[Series 1: us abdomen limited ruq (liver/gb) · 14 of 51 slices shown]
[im 1/51]
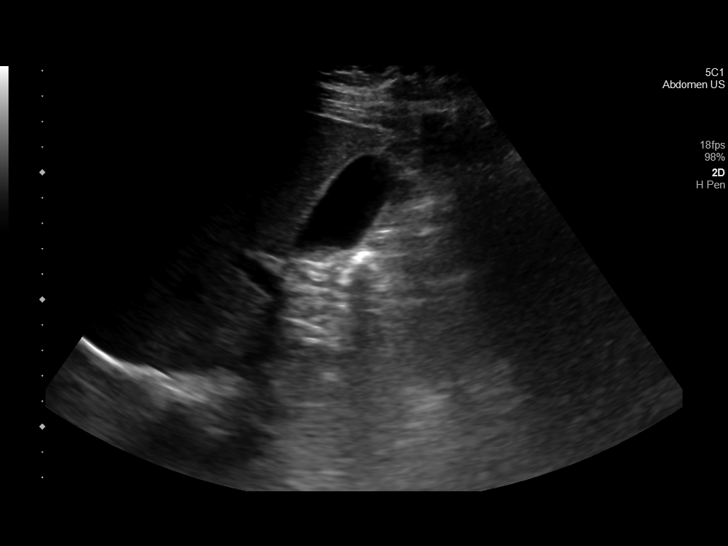
[im 5/51]
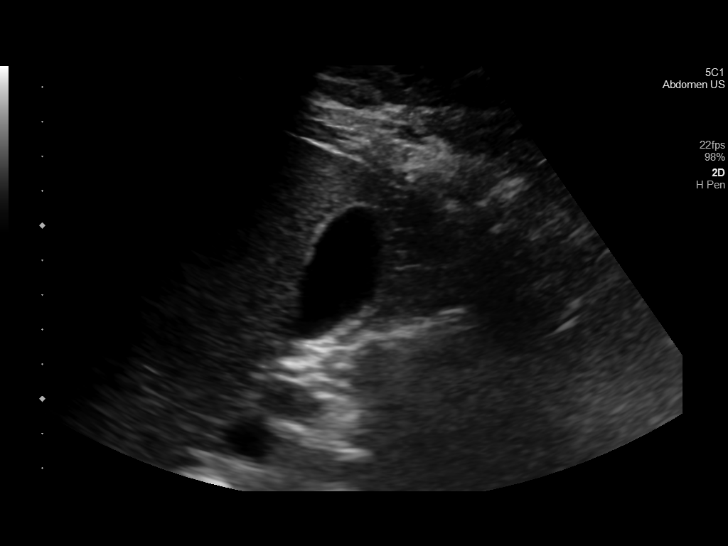
[im 9/51]
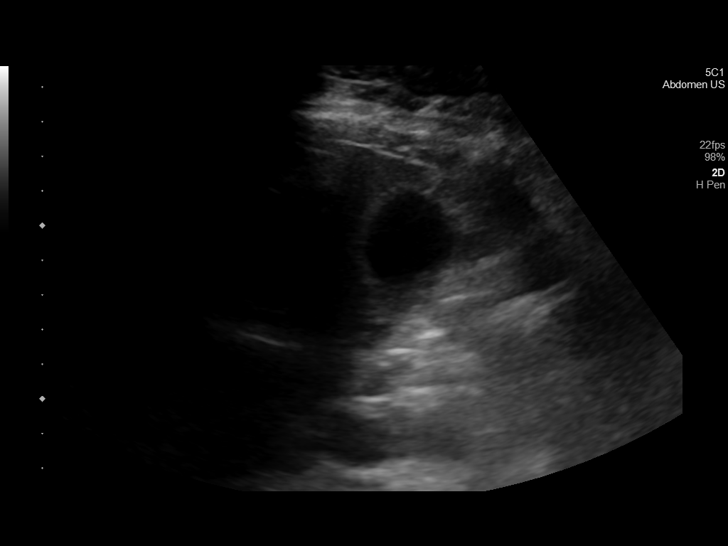
[im 13/51]
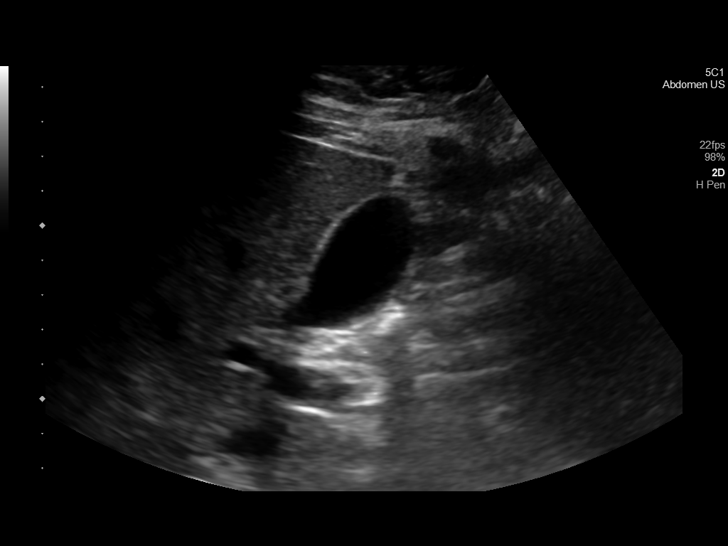
[im 17/51]
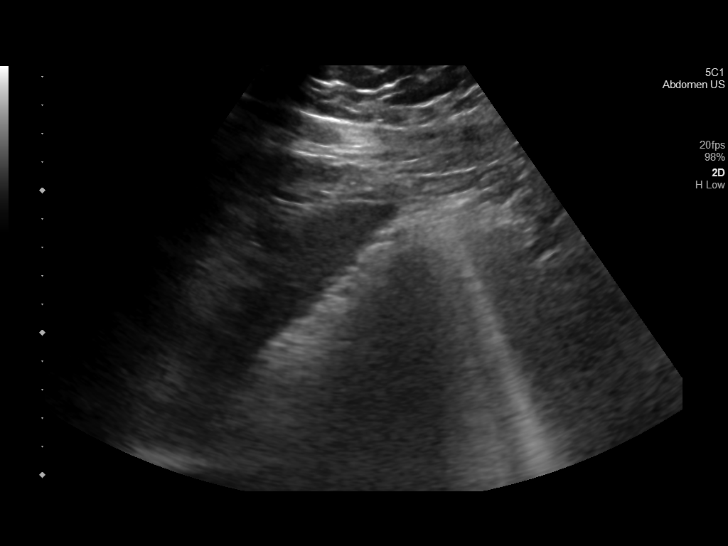
[im 19/51]
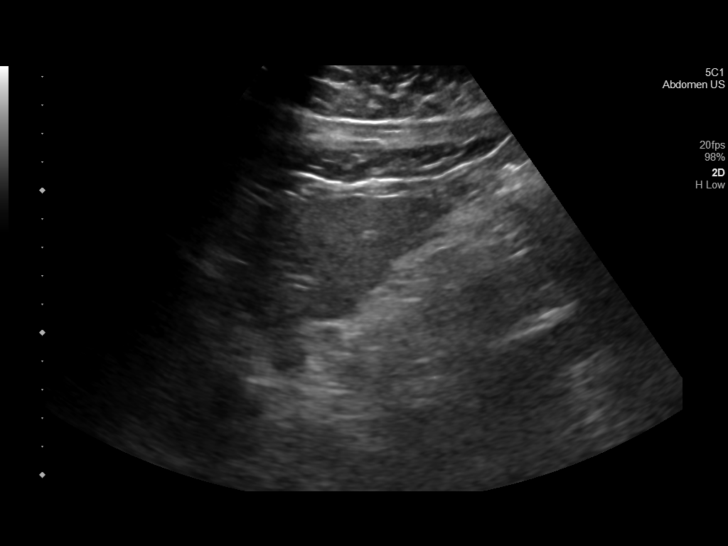
[im 23/51]
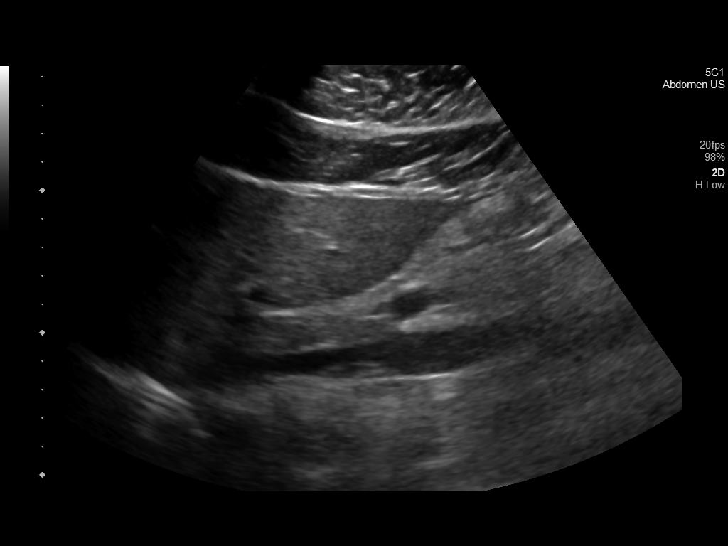
[im 28/51]
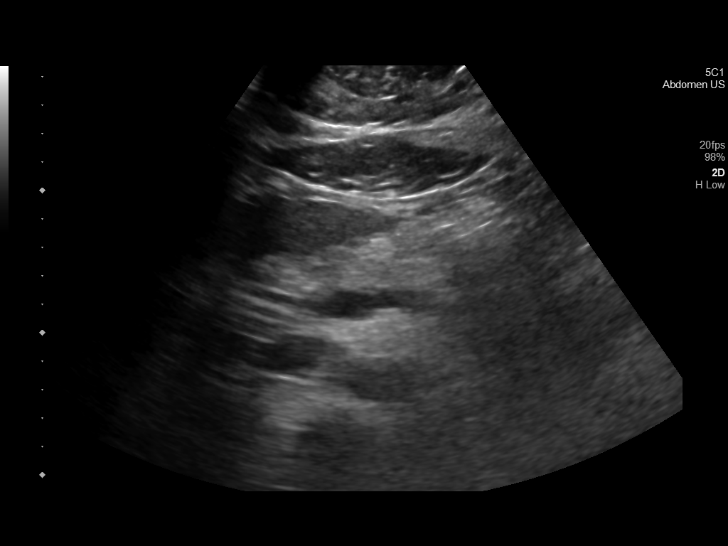
[im 32/51]
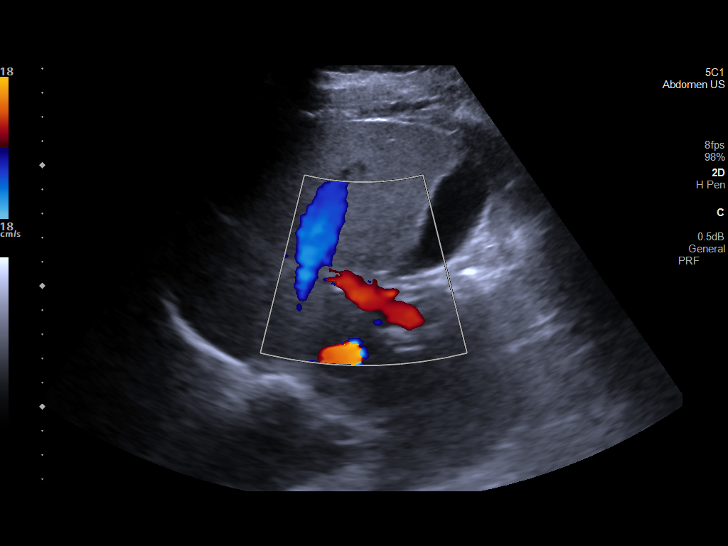
[im 34/51]
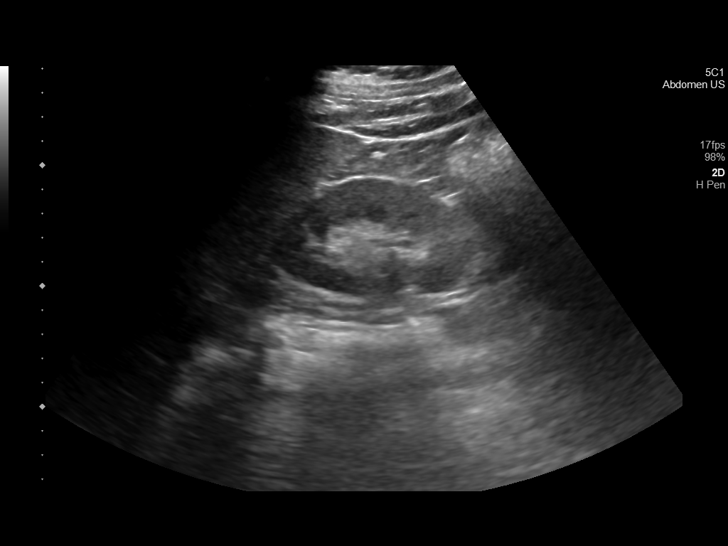
[im 38/51]
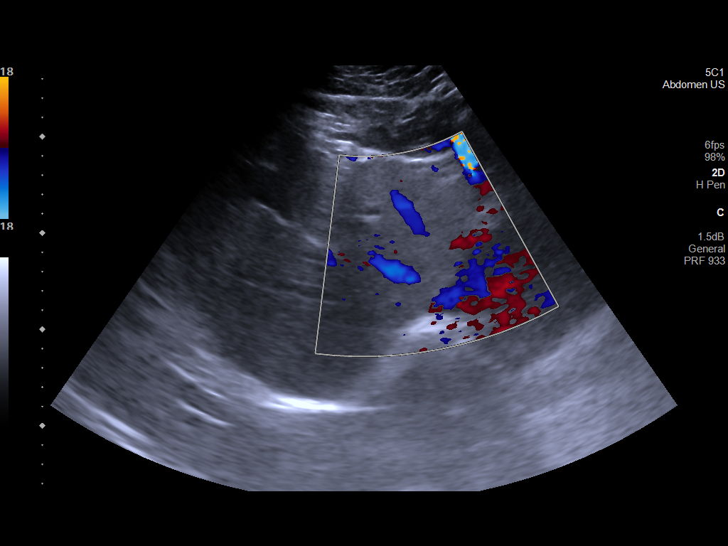
[im 42/51]
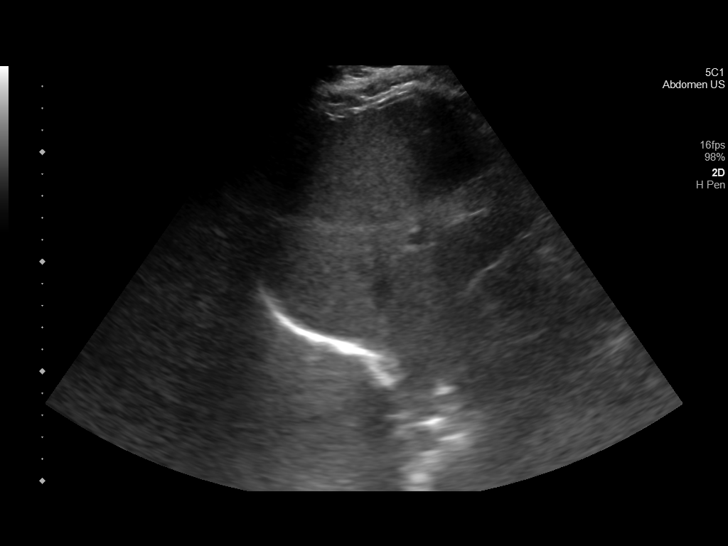
[im 46/51]
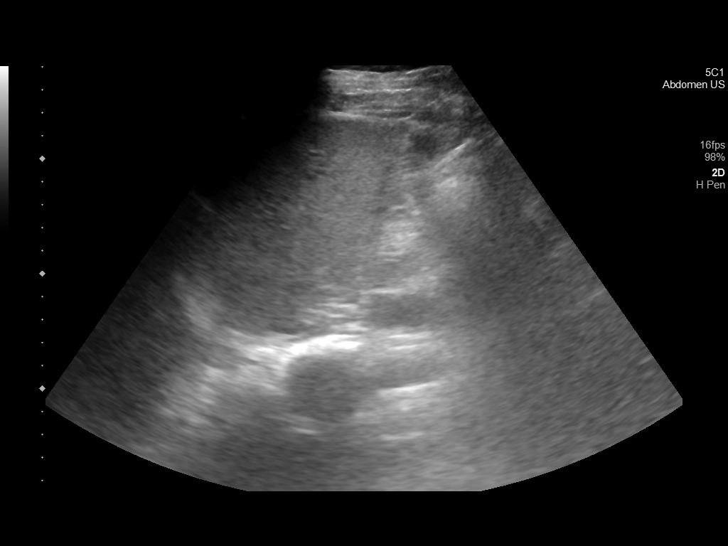
[im 51/51]
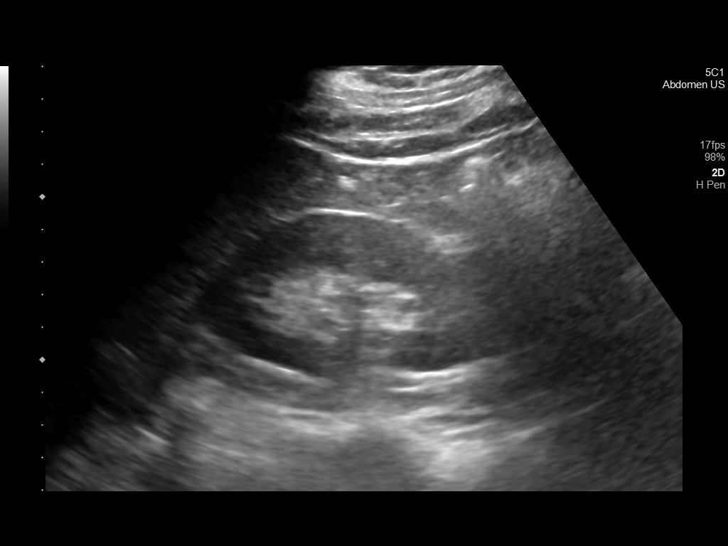

[14 of 25 positions shown; findings below may reference images not displayed]

FINDINGS: Gallbladder:

No gallstones or wall thickening visualized. No sonographic Murphy
sign noted by sonographer.

Common bile duct:

Diameter: 3 mm in proximal diameter

Liver:

No focal lesion identified. Within normal limits in parenchymal
echogenicity. Portal vein is patent on color Doppler imaging with
normal direction of blood flow towards the liver.

Other: None.
IMPRESSION: Normal right upper quadrant sonogram

## 2021-09-04 ENCOUNTER — Ambulatory Visit: Payer: Medicaid Other | Admitting: Surgery

## 2021-09-06 ENCOUNTER — Encounter: Payer: Self-pay | Admitting: Surgery

## 2021-09-06 ENCOUNTER — Other Ambulatory Visit: Payer: Self-pay

## 2021-09-06 ENCOUNTER — Ambulatory Visit (INDEPENDENT_AMBULATORY_CARE_PROVIDER_SITE_OTHER): Payer: Medicaid Other | Admitting: Surgery

## 2021-09-06 ENCOUNTER — Ambulatory Visit: Payer: Medicaid Other

## 2021-09-06 ENCOUNTER — Telehealth: Payer: Self-pay

## 2021-09-06 VITALS — BP 110/79 | HR 93 | Temp 98.9°F | Ht 63.5 in | Wt 217.0 lb

## 2021-09-06 DIAGNOSIS — K6289 Other specified diseases of anus and rectum: Secondary | ICD-10-CM | POA: Diagnosis not present

## 2021-09-06 DIAGNOSIS — G8929 Other chronic pain: Secondary | ICD-10-CM

## 2021-09-06 DIAGNOSIS — R1013 Epigastric pain: Secondary | ICD-10-CM
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
## 2021-09-06 NOTE — Telephone Encounter (Signed)
The pathogenesis of esophageal web is unknown.  It could be congenital.  Without clear-cut evidence, I cannot attribute esophageal web to her fall  RV

## 2021-09-06 NOTE — Patient Instructions (Addendum)
CT scan scheduled  09/18/21 @ 8:15 am [facility].   Please  pick up prep kit today @ [facility] when leaving the office today.  Please do not eat/drink 4 hours prior to having this scan.   Dr.Rodenberg will call you with the results.

## 2021-09-06 NOTE — Telephone Encounter (Signed)
Pt called requesting you to note in your office note or make a separate comment that you feel the webbing and closing of her esophagus was a secondary results of her fall that she had. She has an attorney that stated she needs this information in writing. If you have any questions, please contact the patient.

## 2021-09-07 NOTE — Telephone Encounter (Signed)
Patient verbalized understanding of information

## 2021-09-18 ENCOUNTER — Ambulatory Visit: Admission: RE | Admit: 2021-09-18 | Payer: Medicaid Other | Source: Ambulatory Visit

## 2021-09-19 ENCOUNTER — Other Ambulatory Visit: Payer: Self-pay | Admitting: Physical Medicine & Rehabilitation

## 2021-09-19 DIAGNOSIS — M542 Cervicalgia: Secondary | ICD-10-CM

## 2021-10-03 ENCOUNTER — Ambulatory Visit
Admission: RE | Admit: 2021-10-03 | Discharge: 2021-10-03 | Disposition: A | Discharge status: Home / Self Care | Payer: Medicaid Other | Source: Ambulatory Visit | Attending: Physical Medicine & Rehabilitation | Admitting: Physical Medicine & Rehabilitation

## 2021-10-03 ENCOUNTER — Other Ambulatory Visit: Payer: Self-pay

## 2021-10-03 DIAGNOSIS — M542 Cervicalgia: Secondary | ICD-10-CM | POA: Diagnosis present

## 2021-10-03 IMAGING — MR MR CERVICAL SPINE W/O CM
4 of 5 series · 23 of 48 positions shown · non-contrast
Comparison: No prior cervical spine MRI, correlation is made with
CT cervical spine [DATE]

CLINICAL DATA: Neck pain, radiating down to right arm to hand

EXAM:
MRI CERVICAL SPINE WITHOUT CONTRAST
TECHNIQUE: Multiplanar, multisequence MR imaging of the cervical spine was
performed. No intravenous contrast was administered.

[Series 3: T2 · sagittal · 3.0mm · 0.86mm/px · 6 of 13 slices shown (1 of 2)]
[im 1/13]
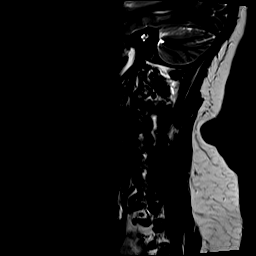
[im 3/13]
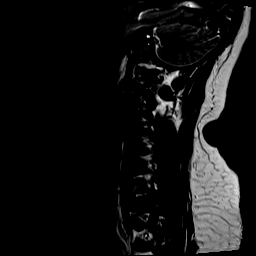
[im 5/13]
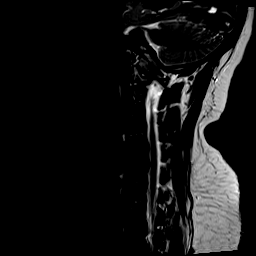
[im 8/13]
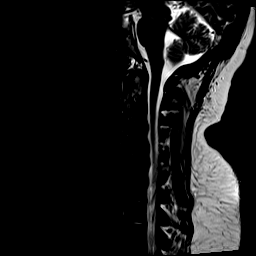
[im 10/13]
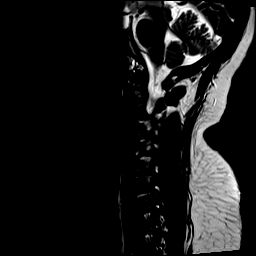
[im 13/13]
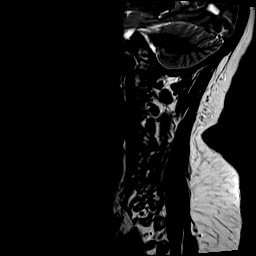

[Series 4: T1 · sagittal · 3.0mm · 0.43mm/px · 5 of 13 slices shown]
[im 1/13]
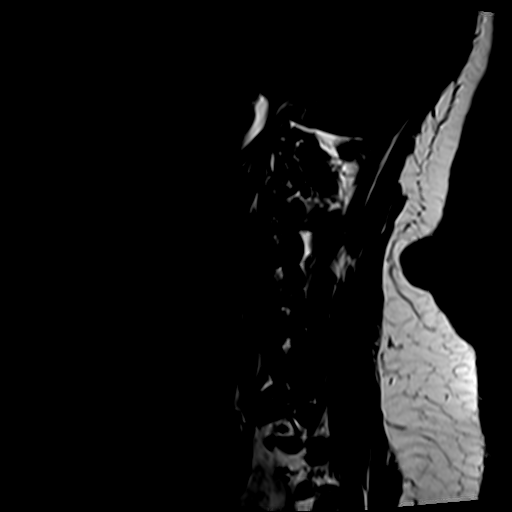
[im 3/13]
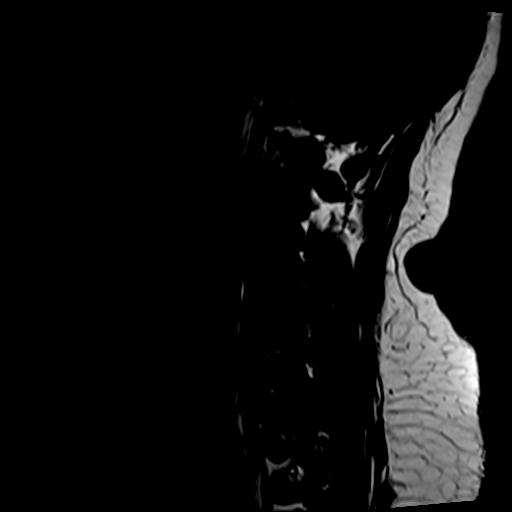
[im 5/13]
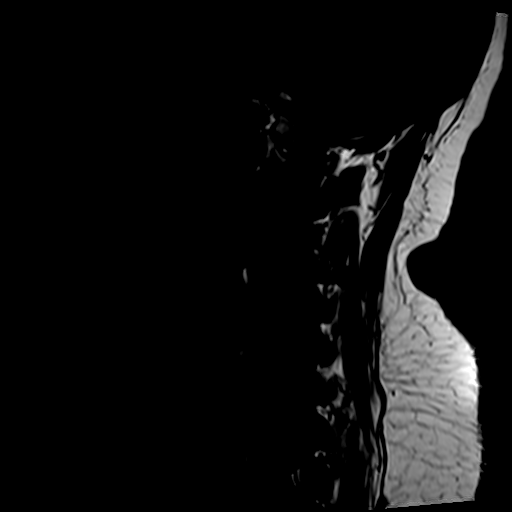
[im 8/13]
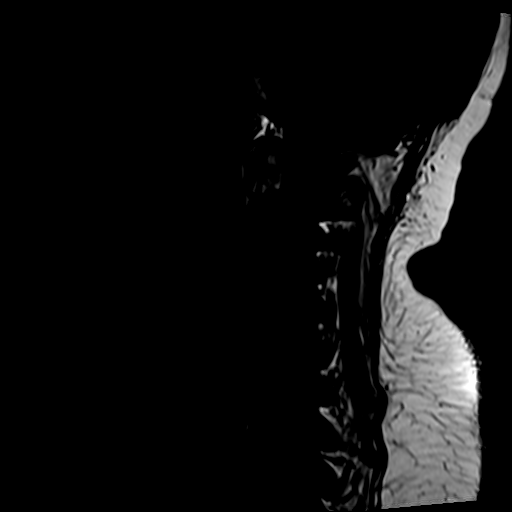
[im 13/13]
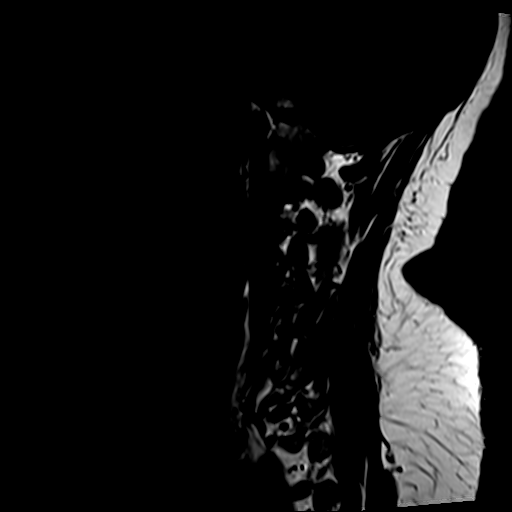

[Series 5: STIR · sagittal · 3.0mm · 0.43mm/px · 3 of 13 slices shown]
[im 3/13]
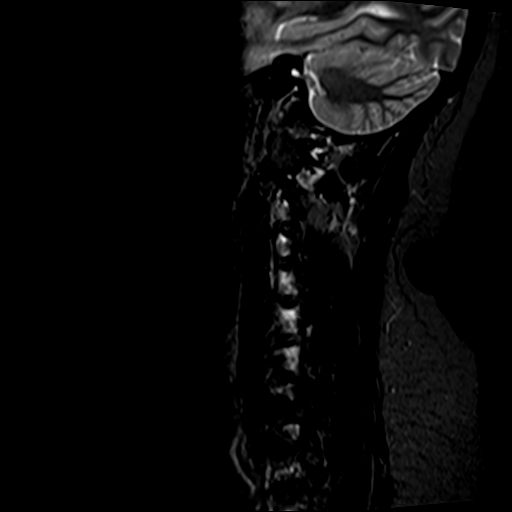
[im 8/13]
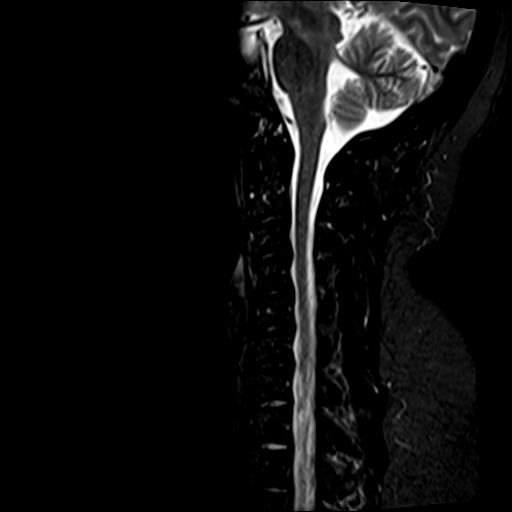
[im 13/13]
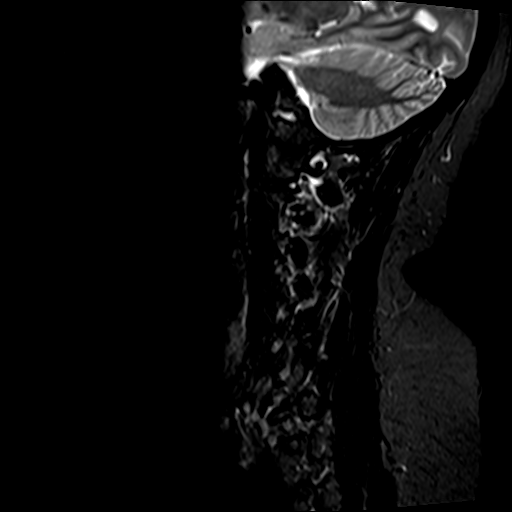

[Series 6: T2 · axial · 3.0mm · 0.35mm/px · z∈[-44,+69]mm · 9 of 34 slices shown (2 of 2)]
[im 1/34]
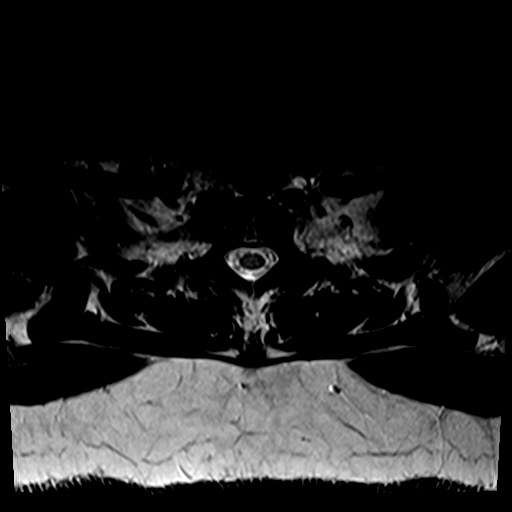
[im 5/34]
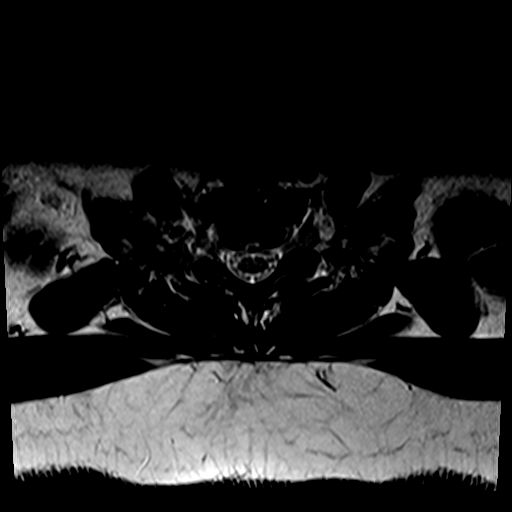
[im 10/34]
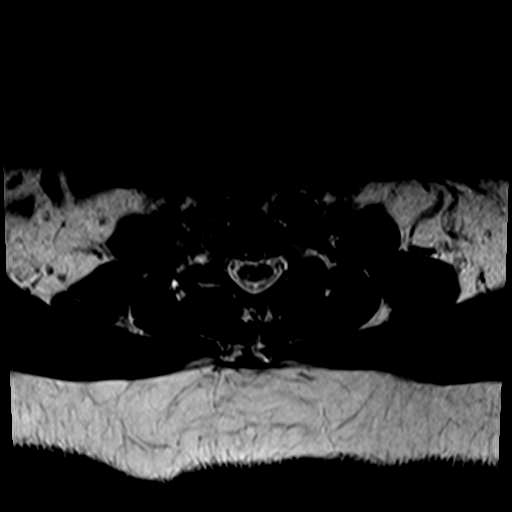
[im 15/34]
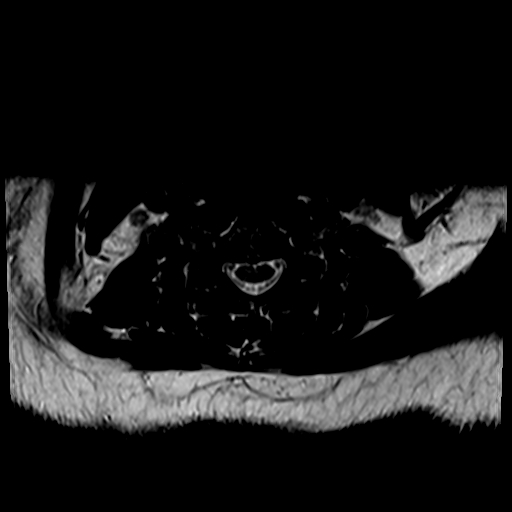
[im 17/34]
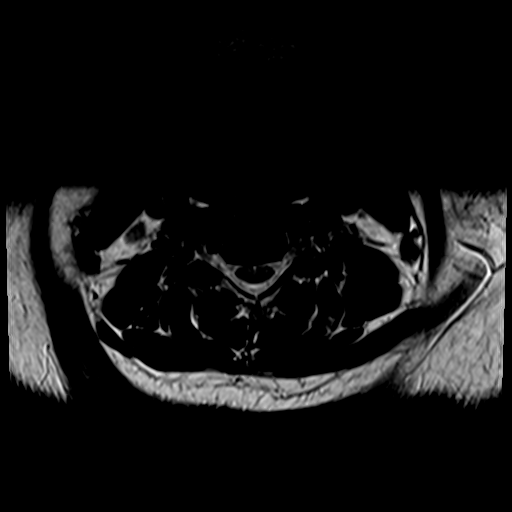
[im 19/34]
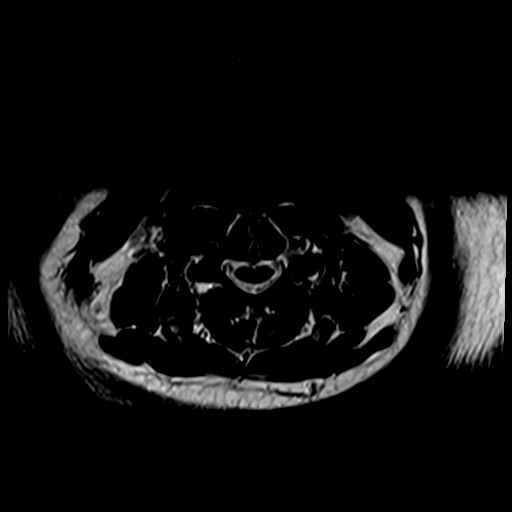
[im 24/34]
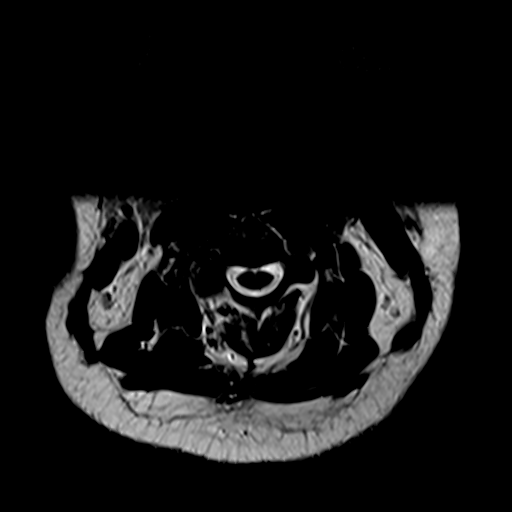
[im 29/34]
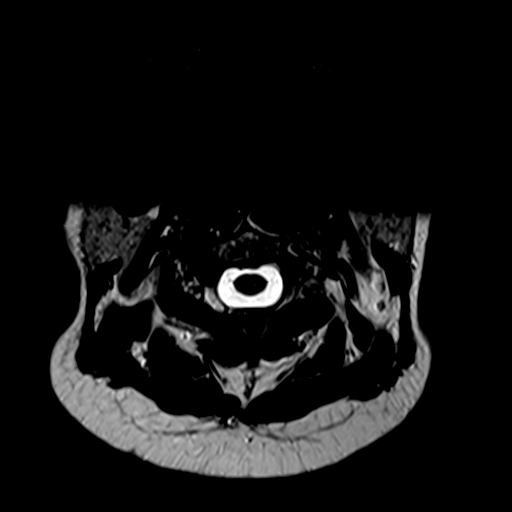
[im 34/34]
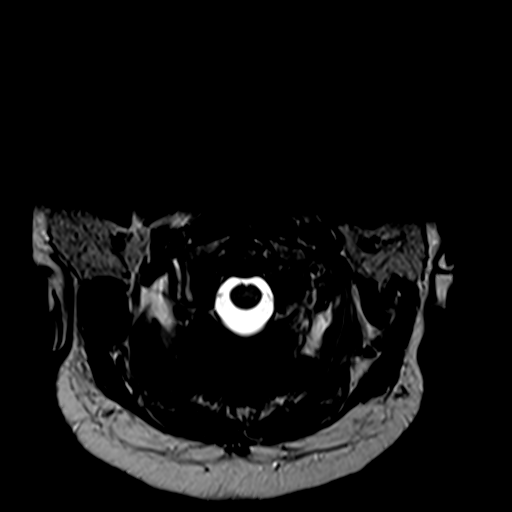

[23 of 48 positions shown; findings below may reference images not displayed]

FINDINGS: Evaluation is somewhat limited by motion artifact.

Alignment: Straightening of the normal cervical lordosis. Mild
reversal. Redemonstrated trace anterolisthesis C4 on C5.

Vertebrae: No acute fracture or suspicious osseous lesion.

Cord: Normal signal and morphology.

Posterior Fossa, vertebral arteries, paraspinal tissues: Negative.

Disc levels:

C2-C3: No significant disc bulge. No spinal canal stenosis or
neuroforaminal narrowing.

C3-C4: No significant disc bulge. No spinal canal stenosis or
neuroforaminal narrowing.

C4-C5: No significant disc bulge. No spinal canal stenosis or
neuroforaminal narrowing.

C5-C6: No significant disc bulge. No spinal canal stenosis or
neuroforaminal narrowing.

C6-C7: No significant disc bulge. No spinal canal stenosis or
neuroforaminal narrowing.

C7-T1: No significant disc bulge. No spinal canal stenosis or
neuroforaminal narrowing.
IMPRESSION: No significant spinal canal stenosis or neural foraminal narrowing.

## 2021-10-10 ENCOUNTER — Encounter: Payer: Self-pay | Admitting: Emergency Medicine

## 2021-10-10 ENCOUNTER — Other Ambulatory Visit: Payer: Self-pay

## 2021-10-10 ENCOUNTER — Emergency Department
Admission: EM | Admit: 2021-10-10 | Discharge: 2021-10-10 | Disposition: A | Discharge status: Home / Self Care | Payer: Medicaid Other | Attending: Emergency Medicine | Admitting: Emergency Medicine

## 2021-10-10 DIAGNOSIS — M436 Torticollis: Secondary | ICD-10-CM | POA: Diagnosis not present

## 2021-10-10 DIAGNOSIS — M7918 Myalgia, other site: Secondary | ICD-10-CM

## 2021-10-10 DIAGNOSIS — M25511 Pain in right shoulder: Secondary | ICD-10-CM | POA: Diagnosis present

## 2021-10-10 DIAGNOSIS — M62838 Other muscle spasm: Secondary | ICD-10-CM

## 2021-10-10 MED ORDER — LIDOCAINE 5 % EX PTCH: 1.0000 | MEDICATED_PATCH | Freq: Two times a day (BID) | CUTANEOUS | 0 refills | Status: AC

## 2021-10-10 MED ORDER — ACETAMINOPHEN 500 MG PO TABS
1000.0000 mg | ORAL_TABLET | Freq: Once | ORAL | Status: AC
  Administered 2021-10-10: 1000 mg via ORAL
  Filled 2021-10-10: qty 2

## 2021-10-10 MED ORDER — KETOROLAC TROMETHAMINE 30 MG/ML IJ SOLN
30.0000 mg | Freq: Once | INTRAMUSCULAR | Status: AC
  Administered 2021-10-10: 30 mg via INTRAMUSCULAR
  Filled 2021-10-10: qty 1

## 2021-10-10 MED ORDER — LIDOCAINE HCL (PF) 1 % IJ SOLN
10.0000 mL | Freq: Once | INTRAMUSCULAR | Status: AC
  Administered 2021-10-10: 10 mL
  Filled 2021-10-10: qty 10

## 2021-10-10 NOTE — ED Triage Notes (Addendum)
Pt to triage via w/c with no distress noted; pt reports she had "webbing removed from her esophagus" month ago; c/o neck pain with ROM and diff breathing when lying supine since; pt has been seen for same previously  ?

## 2021-10-10 NOTE — Discharge Instructions (Signed)
Please take Tylenol and ibuprofen/Advil for your pain.  It is safe to take them together, or to alternate them every few hours.  Take up to 1000mg of Tylenol at a time, up to 4 times per day.  Do not take more than 4000 mg of Tylenol in 24 hours.  For ibuprofen, take 400-600 mg, 4-5 times per day. ° °Please use lidocaine patches at your site of pain.  Apply 1 patch at a time, leave on for 12 hours, then remove for 12 hours.  12 hours on, 12 hours off.  Do not apply more than 1 patch at a time. ° °

## 2021-10-10 NOTE — ED Provider Notes (Signed)
? ?[facility] ?Provider Note ? ? ? Event Date/Time  ? First MD Initiated Contact with Patient 10/10/21 [phone]   ?  (approximate) ? ? ?History  ? ?Neck Pain ? ? ?HPI ? ?Teresa Meza is a 41 y.o. female who presents to the ED for evaluation of Neck Pain ?  ?I review PMR and neurology clinic visits from 3/20. Headache disorder and cervicalgia. Outpatient MR C spine on 3/15 normal.  ? ?Patient presents to the ED for evaluation of multiple weeks of continued right-sided shoulder and neck pain.  Reports pain to the posterior right shoulder, around her trapezius musculature, radiating upwards to her neck and over her occiput.  Reports pain occasionally radiating down her right-sided paraspinal spine.  Reports poorly controlled pain at home and associated anxiety.  Reports feeling like she has trouble breathing when she lays flat because she is concerned her throat might close off due to the pain. ? ?Denies any lower extremity edema, cough, dyspnea on exertion, chest pain or shortness of breath when upright. ? ?Physical Exam  ? ?Triage Vital Signs: ?ED Triage Vitals  ?Enc Vitals Group  ?   BP 10/10/21 0646 114/82  ?   Pulse Rate 10/10/21 0646 89  ?   Resp 10/10/21 0646 18  ?   Temp 10/10/21 0646 98.5 ?F (36.9 ?C)  ?   Temp Source 10/10/21 0646 Oral  ?   SpO2 10/10/21 0646 95 %  ?   Weight 10/10/21 0644 208 lb (94.3 kg)  ?   Height 10/10/21 0644 5\' 3"  (1.6 m)  ?   Head Circumference --   ?   Peak Flow --   ?   Pain Score 10/10/21 0644 9  ?   Pain Loc --   ?   Pain Edu? --   ?   Excl. in GC? --   ? ? ?Most recent vital signs: ?Vitals:  ? 10/10/21 0646  ?BP: 114/82  ?Pulse: 89  ?Resp: 18  ?Temp: 98.5 ?F (36.9 ?C)  ?SpO2: 95%  ? ? ?General: Awake, no distress.  Obese.  Appears anxious.  Otherwise pleasant and conversational. ?CV:  Good peripheral perfusion.  ?Resp:  Normal effort.  ?Abd:  No distention.  ?MSK:  No deformity noted.  No skin changes, erythema, rashes or signs of trauma. ?Right-sided  trapezius musculature is tender to palpation with a palpable muscular cord and reproducing her symptoms.  Similarly, tenderness to palpation to right-sided paraspinal musculature.  Midline spine is nontender without step-offs. ?Neuro:  No focal deficits appreciated. ?Other:   ? ? ?ED Results / Procedures / Treatments  ? ?Labs ?(all labs ordered are listed, but only abnormal results are displayed) ?Labs Reviewed - No data to display ? ?EKG ? ? ?RADIOLOGY ? ? ?Official radiology report(s): ?No results found. ? ?PROCEDURES and INTERVENTIONS: ? ?Procedures ? ?Trigger point injection ?After discussing risks versus benefits, patient is agreeable to proceed with trigger point injection for treatment of acute muscular spasm. ?Risks discussed include worsening pain, infection, neurovascular damage and pneumothorax.  ?Point of maximal tenderness was identified at right-sided trapezius musculature with right-sided paraspinal cervical musculature..  2 spots to the trapezius, 1 to the right-sided paraspinal thoracic and 1 to right-sided paraspinal cervical musculature. ?Overlying skin was cleaned with alcohol swabs. ?1% lidocaine without epinephrine was incrementally injected, after confirming negative drawback, into this musculature. Total of 10/12/21 injected across these 4 sites. ?Well-tolerated without any apparent complications. ? ? ?Medications  ?acetaminophen (TYLENOL) tablet  1,000 mg (1,000 mg Oral Given 10/10/21 0853)  ?ketorolac (TORADOL) 30 MG/ML injection 30 mg (30 mg Intramuscular Given 10/10/21 0853)  ?lidocaine (PF) (XYLOCAINE) 1 % injection 10 mL (10 mLs Infiltration Given by Other 10/10/21 0901)  ? ? ? ?IMPRESSION / MDM / ASSESSMENT AND PLAN / ED COURSE  ?I reviewed the triage vital signs and the nursing notes. ? ?41 year old female presents to the ED with subacute right-sided neck pain, likely MSK in etiology, with some signs of torticollis and ultimately suitable for outpatient management.  Reassuring  examination without evidence of trauma, neurologic or vascular deficits.  Reassuring clinical picture considering she had a normal MRI 1 week ago without any further trauma, injuries or events.  Provided trigger point injection, Tylenol and NSAIDs with improvement of her pain.  Discharged with lidocaine patches and recommendations for NSAIDs.  Provided printout of rehab exercises.  Discussed return precautions. ? ?Clinical Course as of 10/10/21 0935  ?Wed Oct 10, 2021  ?0902 Trigger point injection performed and well tolerated [DS]  ?0928 Reassessed.  Feeling better.  We discussed management at home and return precautions for the ED. [DS]  ?  ?Clinical Course User Index ?[DS] Delton Prairie, MD  ? ? ? ?FINAL CLINICAL IMPRESSION(S) / ED DIAGNOSES  ? ?Final diagnoses:  ?Trapezius muscle spasm  ?Torticollis, acute  ?Musculoskeletal pain  ? ? ? ?Rx / DC Orders  ? ?ED Discharge Orders   ? ?      Ordered  ?  lidocaine (LIDODERM) 5 %  Every 12 hours       ? 10/10/21 0929  ? ?  ?  ? ?  ? ? ? ?Note:  This document was prepared using Dragon voice recognition software and may include unintentional dictation errors. ?  ?Delton Prairie, MD ?10/10/21 [phone] ? ?

## 2021-10-30 ENCOUNTER — Encounter: Payer: Self-pay | Admitting: Emergency Medicine

## 2021-10-30 ENCOUNTER — Other Ambulatory Visit: Payer: Self-pay

## 2021-10-30 ENCOUNTER — Emergency Department
Admission: EM | Admit: 2021-10-30 | Discharge: 2021-10-30 | Disposition: A | Discharge status: Home / Self Care | Payer: Medicaid Other | Attending: Emergency Medicine | Admitting: Emergency Medicine

## 2021-10-30 ENCOUNTER — Emergency Department: Payer: Medicaid Other

## 2021-10-30 DIAGNOSIS — G43011 Migraine without aura, intractable, with status migrainosus: Secondary | ICD-10-CM | POA: Diagnosis not present

## 2021-10-30 DIAGNOSIS — R42 Dizziness and giddiness: Secondary | ICD-10-CM | POA: Insufficient documentation

## 2021-10-30 DIAGNOSIS — R519 Headache, unspecified: Secondary | ICD-10-CM | POA: Diagnosis present

## 2021-10-30 LAB — CBC WITH DIFFERENTIAL/PLATELET
Abs Immature Granulocytes: 0.03 10*3/uL (ref 0.00–0.07)
Basophils Absolute: 0 10*3/uL (ref 0.0–0.1)
Basophils Relative: 1 %
Eosinophils Absolute: 0.2 10*3/uL (ref 0.0–0.5)
Eosinophils Relative: 3 %
HCT: 40.8 % (ref 36.0–46.0)
Hemoglobin: 13.1 g/dL (ref 12.0–15.0)
Immature Granulocytes: 0 %
Lymphocytes Relative: 33 %
Lymphs Abs: 2.4 10*3/uL (ref 0.7–4.0)
MCH: 27.7 pg (ref 26.0–34.0)
MCHC: 32.1 g/dL (ref 30.0–36.0)
MCV: 86.3 fL (ref 80.0–100.0)
Monocytes Absolute: 0.5 10*3/uL (ref 0.1–1.0)
Monocytes Relative: 7 %
Neutro Abs: 4 10*3/uL (ref 1.7–7.7)
Neutrophils Relative %: 56 %
Platelets: 315 10*3/uL (ref 150–400)
RBC: 4.73 MIL/uL (ref 3.87–5.11)
RDW: 12.8 % (ref 11.5–15.5)
WBC: 7.1 10*3/uL (ref 4.0–10.5)
nRBC: 0 % (ref 0.0–0.2)

## 2021-10-30 LAB — COMPREHENSIVE METABOLIC PANEL
ALT: 15 U/L (ref 0–44)
AST: 20 U/L (ref 15–41)
Albumin: 3.6 g/dL (ref 3.5–5.0)
Alkaline Phosphatase: 62 U/L (ref 38–126)
Anion gap: 7 (ref 5–15)
BUN: 12 mg/dL (ref 6–20)
CO2: 24 mmol/L (ref 22–32)
Calcium: 8.9 mg/dL (ref 8.9–10.3)
Chloride: 105 mmol/L (ref 98–111)
Creatinine, Ser: 0.82 mg/dL (ref 0.44–1.00)
GFR, Estimated: >60 mL/min (ref >60)
Glucose, Bld: 181 mg/dL — ABNORMAL HIGH (ref 70–99)
Potassium: 3.7 mmol/L (ref 3.5–5.1)
Sodium: 136 mmol/L (ref 135–145)
Total Bilirubin: 0.8 mg/dL (ref 0.3–1.2)
Total Protein: 7.7 g/dL (ref 6.5–8.1)

## 2021-10-30 LAB — POC URINE PREG, ED: Preg Test, Ur: NEGATIVE

## 2021-10-30 LAB — TROPONIN I (HIGH SENSITIVITY): Troponin I (High Sensitivity): 2 ng/L (ref <18)

## 2021-10-30 IMAGING — CT CT HEAD W/O CM
4 series · 16 of 47 positions shown, 18 images · non-contrast
Comparison: CT head [DATE].

CLINICAL DATA: Dizziness, persistent/recurrent, cardiac or vascular
cause suspected



[Series 2: head wo · axial · 0.43mm/px · z∈[-122,-2]mm · 7 of 33 slices shown, 9 images]
[im 5/33  brain]
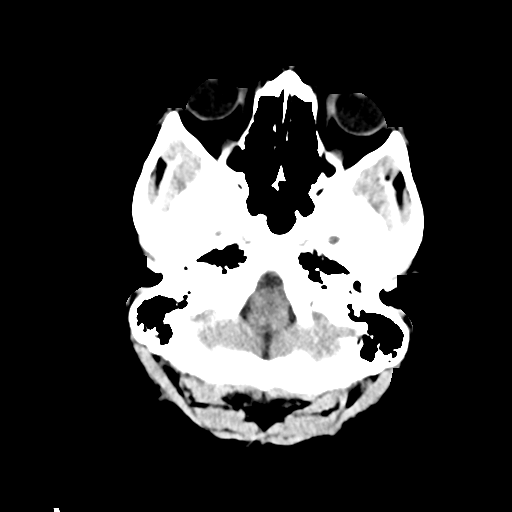
[im 5/33  bone]
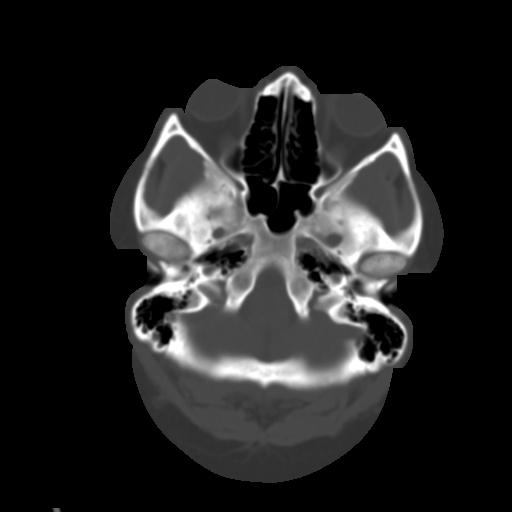
[im 9/33  brain]
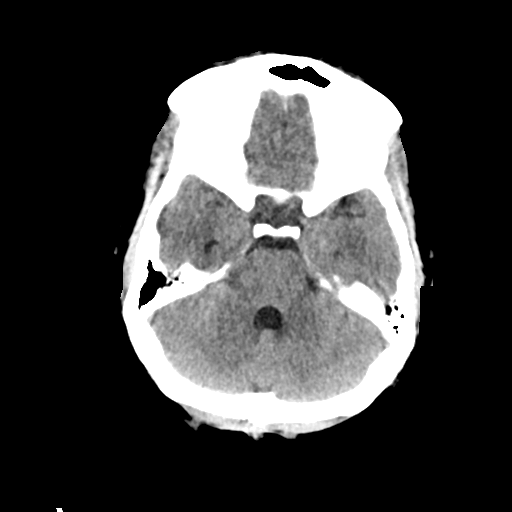
[im 13/33  brain]
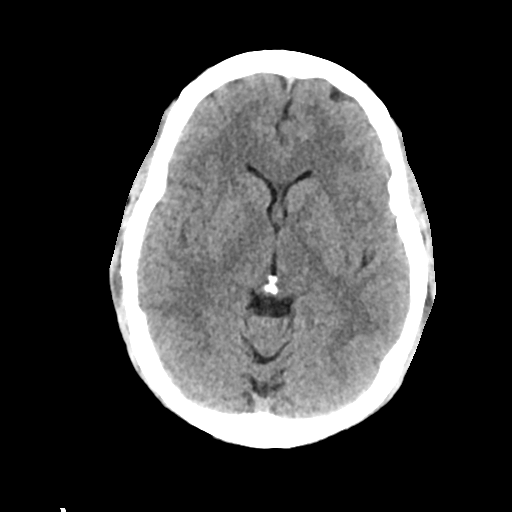
[im 17/33  brain]
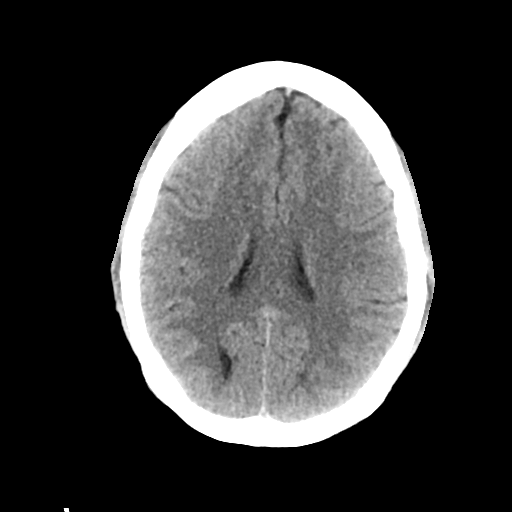
[im 21/33  brain]
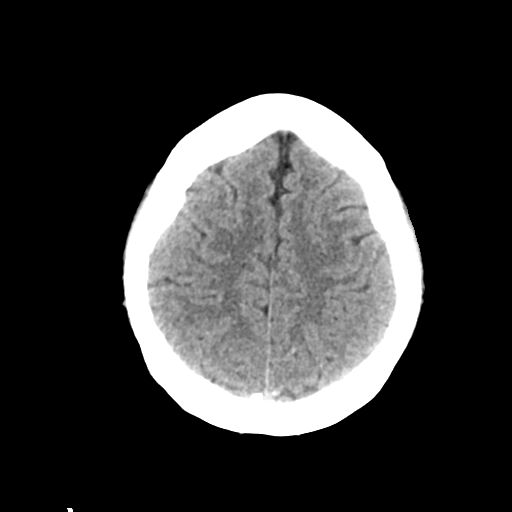
[im 21/33  bone]
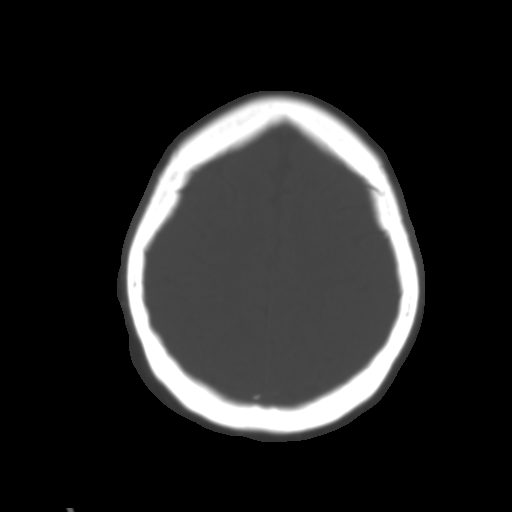
[im 25/33  brain]
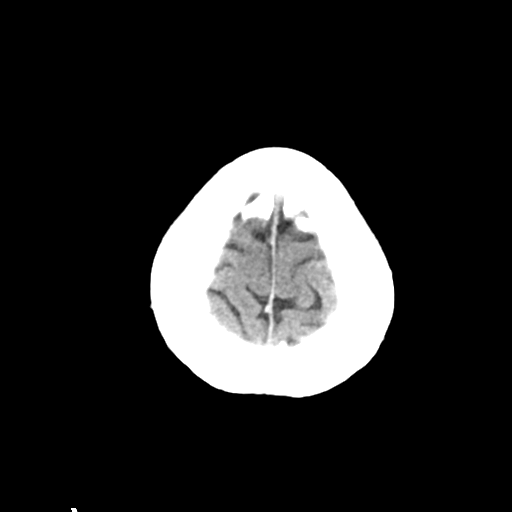
[im 29/33  brain]
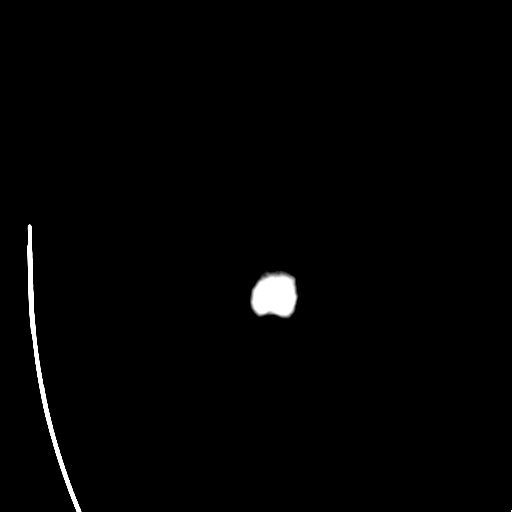

[Series 3: head bone · axial · 0.43mm/px · z∈[-126,-94]mm · 3 of 82 slices shown]
[im 9/82  bone]
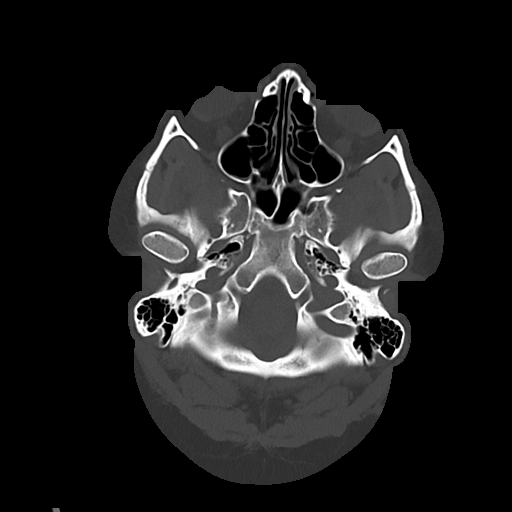
[im 17/82  bone]
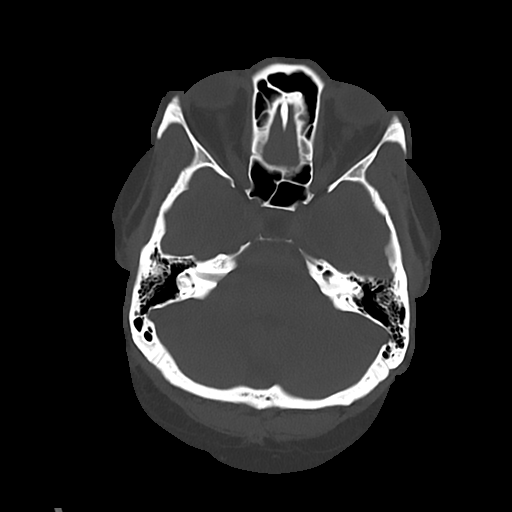
[im 25/82  bone]
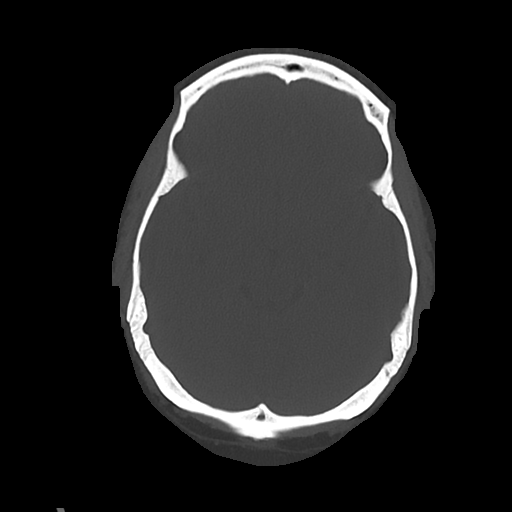

[Series 4: cor soft · coronal · 0.31mm/px · 3 of 65 slices shown]
[im 22/65  brain]
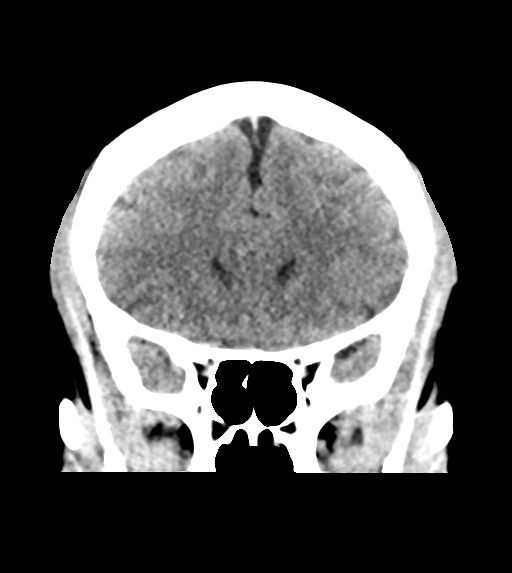
[im 29/65  brain]
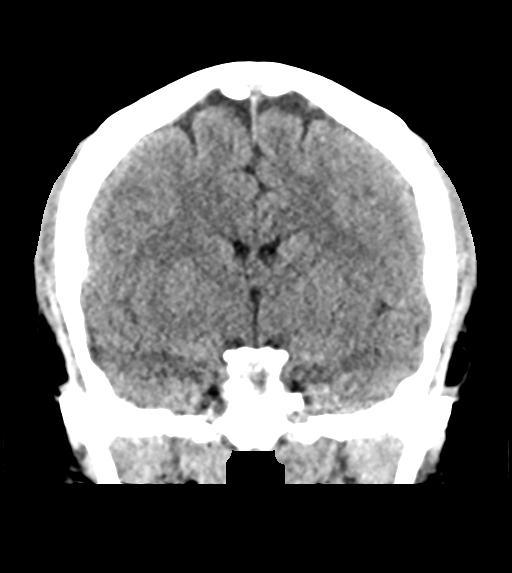
[im 36/65  brain]
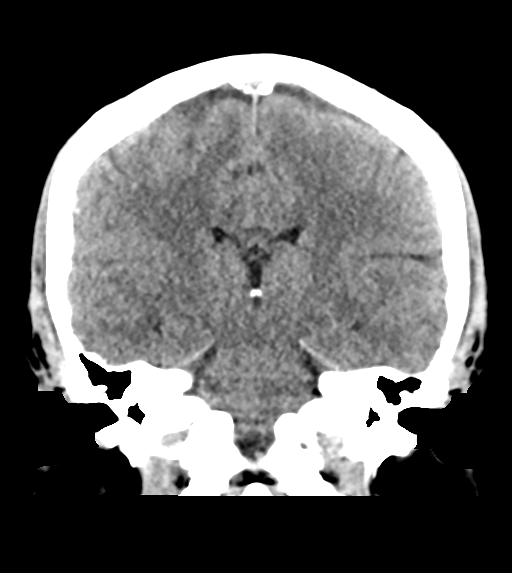

[Series 5: sag soft · sagittal · 0.33mm/px · 3 of 54 slices shown]
[im 18/54  brain]
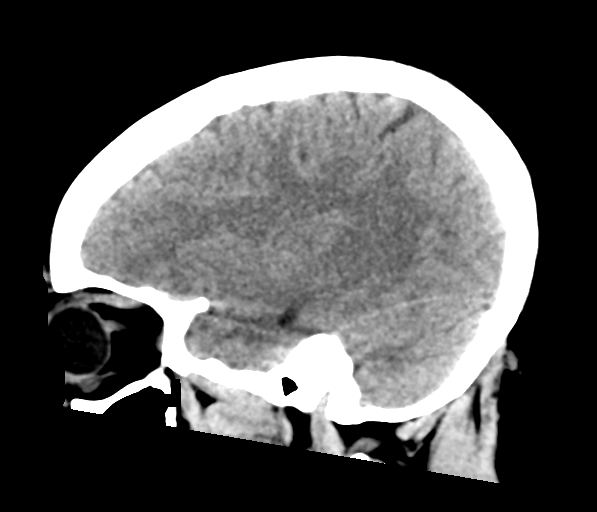
[im 27/54  brain]
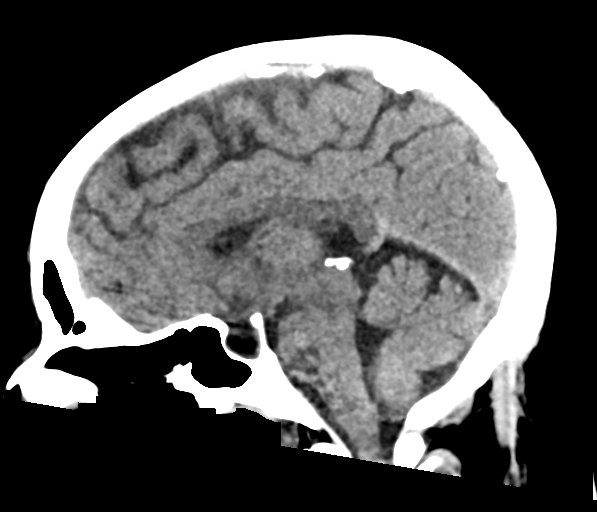
[im 36/54  brain]
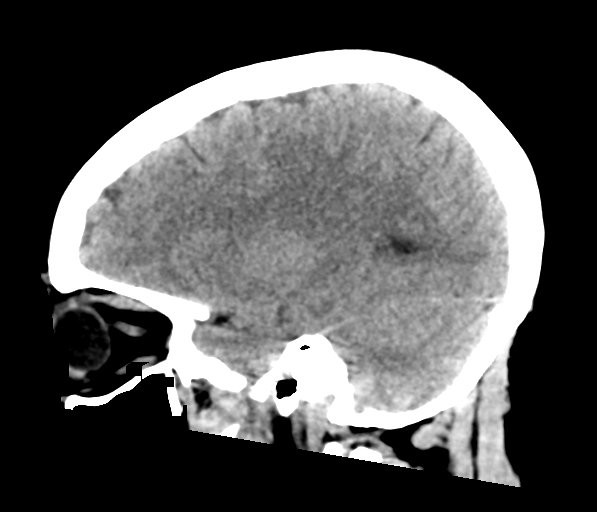

[16 of 47 positions shown; findings below may reference images not displayed]

FINDINGS: Brain: No evidence of acute infarction, hemorrhage, hydrocephalus,
extra-axial collection or mass lesion/mass effect. Partially empty
sella.

Vascular: No hyperdense vessel identified.

Skull: No acute fracture.

Sinuses/Orbits: Clear visualized sinuses.  Unremarkable orbits.

Other: No mastoid effusions
IMPRESSION: 1. No evidence of acute intracranial abnormality.
2. Partially empty sella, which is often a normal anatomic variant
but can be associated with idiopathic intracranial hypertension
(pseudotumor cerebri).

## 2021-10-30 MED ORDER — PROCHLORPERAZINE EDISYLATE 10 MG/2ML IJ SOLN
10.0000 mg | Freq: Once | INTRAMUSCULAR | Status: AC
  Administered 2021-10-30: 10 mg via INTRAVENOUS
  Filled 2021-10-30: qty 2

## 2021-10-30 MED ORDER — DIPHENHYDRAMINE HCL 50 MG/ML IJ SOLN
25.0000 mg | Freq: Once | INTRAMUSCULAR | Status: AC
  Administered 2021-10-30: 25 mg via INTRAVENOUS
  Filled 2021-10-30: qty 1

## 2021-10-30 MED ORDER — SODIUM CHLORIDE 0.9 % IV BOLUS
1000.0000 mL | Freq: Once | INTRAVENOUS | Status: AC
  Administered 2021-10-30: 1000 mL via INTRAVENOUS

## 2021-10-30 MED ORDER — ACETAMINOPHEN 500 MG PO TABS
1000.0000 mg | ORAL_TABLET | Freq: Once | ORAL | Status: AC
  Administered 2021-10-30: 1000 mg via ORAL
  Filled 2021-10-30: qty 2

## 2021-10-30 NOTE — ED Provider Notes (Signed)
? ?[facility] ?Provider Note ? ? ? Event Date/Time  ? First MD Initiated Contact with Patient 10/30/21 [phone]   ?  (approximate) ? ? ?History  ? ?Headache ? ? ?HPI ? ?Teresa Meza is a 41 y.o. female with a history of migraines who is followed by neurology who comes in with concern for headache.  On review of records patient was seen on 10/08/2021 by neurology and she asked has an appointment later today.  She has a history of chronic migraines and this was to follow-up with ophthalmology and potentially pain clinic for possible CRPS.  ? ?Patient reports having gradually increasing headache that started yesterday that has been getting a lot worse.  She reports the headache is in the back of her head that wraps around to the front and pierces through her eyes.  She denies any vision changes just more sensitivity to the light.  She reports having headaches previously but this 1 seems to be a little bit different character than what she typically gets.  She does report a history of chronic right arm and leg pain, weakness secondary to the pain that she is working on getting an EMG for.  She does report some increasing dizziness with the headache as well ? ? ?Physical Exam  ? ?Triage Vital Signs: ?ED Triage Vitals  ?Enc Vitals Group  ?   BP 10/30/21 0645 124/90  ?   Pulse Rate 10/30/21 0645 89  ?   Resp 10/30/21 0645 18  ?   Temp 10/30/21 0645 98.7 ?F (37.1 ?C)  ?   Temp Source 10/30/21 0645 Oral  ?   SpO2 10/30/21 0645 96 %  ?   Weight 10/30/21 0646 208 lb (94.3 kg)  ?   Height 10/30/21 0646 5\' 3"  (1.6 m)  ?   Head Circumference --   ?   Peak Flow --   ?   Pain Score 10/30/21 0646 9  ?   Pain Loc --   ?   Pain Edu? --   ?   Excl. in GC? --   ? ? ?Most recent vital signs: ?Vitals:  ? 10/30/21 0645  ?BP: 124/90  ?Pulse: 89  ?Resp: 18  ?Temp: 98.7 ?F (37.1 ?C)  ?SpO2: 96%  ? ? ? ?General: Awake, no distress.  ?CV:  Good peripheral perfusion.  ?Resp:  Normal effort.  ?Abd:  No distention.   ?Other:  Cranial nerves II through XII are intact although patient has chronic right arm and leg weakness so difficult to get her to do finger-to-nose testing secondary to the discomfort and pain in her arm and leg. ? ? ?ED Results / Procedures / Treatments  ? ?Labs ?(all labs ordered are listed, but only abnormal results are displayed) ?Labs Reviewed  ?COMPREHENSIVE METABOLIC PANEL - Abnormal; Notable for the following components:  ?    Result Value  ? Glucose, Bld 181 (*)   ? All other components within normal limits  ?CBC WITH DIFFERENTIAL/PLATELET  ?POC URINE PREG, ED  ?TROPONIN I (HIGH SENSITIVITY)  ? ? ? ?EKG ? ?My interpretation of EKG: ? ?Normal sinus rate of 89 without any ST elevation, T wave version in lead III, normal intervals.  Reviewed patient's prior EKGs she has had similar T wave inversions ? ? ?RADIOLOGY ?I have reviewed the ct head no ICH ? ?PROCEDURES: ? ?Critical Care performed: No ? ?Procedures ? ? ?MEDICATIONS ORDERED IN ED: ?Medications - No data to display ? ? ?IMPRESSION /  MDM / ASSESSMENT AND PLAN / ED COURSE  ?I reviewed the triage vital signs and the nursing notes. ?             ?               ? ?Patient comes in with chronic pain syndrome with concern for worsening headache.  No current vision changes doubt pseudotumor cerebri but she is working on getting outpatient ophthalmology evaluation.  She is also been seen by neurology and has an appointment later today.  No issues with her eyes suggest venous thrombus.  Doubt subarachnoid given gradually increasing.  Discussed with patient CT imaging due to limited neurological exam due to her chronic pain in her right arm and leg and she would like to proceed.  We will give patient some IV medications to help with symptoms.  Given she was dizzy labs were also checked to evaluate for dehydration, ACS. ? ?Pregnancy test negative.  CBC normal.  CMP overall reassuring.  Troponin negative symptoms have been going on for greater than 3  hours ? ?On reevaluation after migraine cocktail patient is feeling much better.  Offered her additional medications for her continued pain but she declined stating that she is got neurology follow-up this afternoon and would rather just follow-up with them ? ?Her CT head was negative other than the concern for partially empty sella which could just be a normal variant versus idiopathic intracranial hypertension.  I reviewed her prior CT imaging from 10/29 and she had this similar read that then.  She is working on getting outpatient ophthalmology follow-up and explained to her this is to look for papilledema.  We discussed lumbar puncture but given this has been an ongoing headaches for some time now without any blurred vision and with a plan to follow-up with ophthalmology we have elected to hold off on lumbar puncture until seen ophthalmology.  I think a lumbar puncture would be technically very difficult and I be worried about the risk of doing it given all of her chronic pain issues.  After discussion she also would not want to do a lumbar puncture now.  I again asked patient if the right-sided pain and weakness secondary to pain was at her baseline and she is adamant that this is her baseline self therefore do not feel patient is having a stroke. ? ? ?Patient also has a neurology appointment later this afternoon and they can further discuss this finding as well.  Patient is requesting discharge home.  She is requesting a work note for next Saturday given that is when she works next but explained I can only do it for 2 days from when I saw the patient.  She expressed understanding and felt comfortable with discharge ? ?I discussed the provisional nature of ED diagnosis, the treatment so far, the ongoing plan of care, follow up appointments and return precautions with the patient and any family or support people present. They expressed understanding and agreed with the plan, discharged home. ? ? ? ? ? ?FINAL  CLINICAL IMPRESSION(S) / ED DIAGNOSES  ? ?Final diagnoses:  ?Intractable migraine without aura and with status migrainosus  ? ? ? ?Rx / DC Orders  ? ?ED Discharge Orders   ? ? None  ? ?  ? ? ? ?Note:  This document was prepared using Dragon voice recognition software and may include unintentional dictation errors. ?  ?Concha Se, MD ?10/30/21 [phone] ? ?

## 2021-10-30 NOTE — Discharge Instructions (Addendum)
Your repeat CT scan is as below.  The reason they want you to follow-up with ophthalmology is to make sure you do not have any papilledema that you require a lumbar puncture to drain out fluid.  You should discuss the CT finding with your neurologist this afternoon and make sure to get an eye appointment as soon as possible.  Your CT however is unchanged from when you were seen last time.  Return to the ER if you develop worsening pain that is associated with vision changes or any other concerns ? ? ?IMPRESSION: ?1. No evidence of acute intracranial abnormality. ?2. Partially empty sella, which is often a normal anatomic variant ?but can be associated with idiopathic intracranial hypertension ?(pseudotumor cerebri). ?  ?

## 2021-10-30 NOTE — ED Notes (Signed)
See triage note  presents with h/a and some dizziness  describes h/a as pressure  afebrile on arrival  ?

## 2021-10-30 NOTE — ED Triage Notes (Signed)
Pt presents via POV with complaints of a headache and dizziness that started around 2300 last night. She describes the pain as pressure to the posterior part of her head. Hx of migraines and is compliant with her home medications (Effexor 37.5mg  BID). Denies CP or SOB. ?

## 2022-05-07 DIAGNOSIS — E119 Type 2 diabetes mellitus without complications: Secondary | ICD-10-CM | POA: Insufficient documentation

## 2022-12-25 ENCOUNTER — Encounter: Payer: Self-pay | Admitting: Family

## 2022-12-25 ENCOUNTER — Ambulatory Visit (INDEPENDENT_AMBULATORY_CARE_PROVIDER_SITE_OTHER): Payer: Medicaid Other | Admitting: Family

## 2022-12-25 VITALS — BP 120/84 | HR 84 | Temp 97.6°F | Ht 63.5 in | Wt 217.4 lb

## 2022-12-25 DIAGNOSIS — M5441 Lumbago with sciatica, right side: Secondary | ICD-10-CM | POA: Diagnosis not present

## 2022-12-25 DIAGNOSIS — G8929 Other chronic pain: Secondary | ICD-10-CM | POA: Diagnosis not present

## 2022-12-25 DIAGNOSIS — E559 Vitamin D deficiency, unspecified: Secondary | ICD-10-CM

## 2022-12-25 DIAGNOSIS — M2550 Pain in unspecified joint: Secondary | ICD-10-CM | POA: Diagnosis not present

## 2022-12-25 DIAGNOSIS — M5412 Radiculopathy, cervical region: Secondary | ICD-10-CM | POA: Diagnosis not present

## 2022-12-25 DIAGNOSIS — Z7984 Long term (current) use of oral hypoglycemic drugs: Secondary | ICD-10-CM

## 2022-12-25 DIAGNOSIS — E119 Type 2 diabetes mellitus without complications: Secondary | ICD-10-CM | POA: Diagnosis not present

## 2022-12-25 DIAGNOSIS — G932 Benign intracranial hypertension: Secondary | ICD-10-CM | POA: Diagnosis not present

## 2022-12-25 DIAGNOSIS — M25531 Pain in right wrist: Secondary | ICD-10-CM

## 2022-12-25 DIAGNOSIS — F322 Major depressive disorder, single episode, severe without psychotic features: Secondary | ICD-10-CM

## 2022-12-25 DIAGNOSIS — Z87828 Personal history of other (healed) physical injury and trauma: Secondary | ICD-10-CM

## 2022-12-25 LAB — CBC
HCT: 43.4 % (ref 36.0–46.0)
Hemoglobin: 14.1 g/dL (ref 12.0–15.0)
MCHC: 32.4 g/dL (ref 30.0–36.0)
MCV: 86 fl (ref 78.0–100.0)
Platelets: 275 10*3/uL (ref 150.0–400.0)
RBC: 5.05 Mil/uL (ref 3.87–5.11)
RDW: 13.6 % (ref 11.5–15.5)
WBC: 4.3 10*3/uL (ref 4.0–10.5)

## 2022-12-25 LAB — VITAMIN D 25 HYDROXY (VIT D DEFICIENCY, FRACTURES): VITD: 20.98 ng/mL — ABNORMAL LOW (ref 30.00–100.00)

## 2022-12-25 LAB — BASIC METABOLIC PANEL
BUN: 11 mg/dL (ref 6–23)
CO2: 23 mEq/L (ref 19–32)
Calcium: 9.3 mg/dL (ref 8.4–10.5)
Chloride: 103 mEq/L (ref 96–112)
Creatinine, Ser: 0.85 mg/dL (ref 0.40–1.20)
GFR: 83.98 mL/min (ref >60.00)
Glucose, Bld: 130 mg/dL — ABNORMAL HIGH (ref 70–99)
Potassium: 4.4 mEq/L (ref 3.5–5.1)
Sodium: 139 mEq/L (ref 135–145)

## 2022-12-25 LAB — SEDIMENTATION RATE: Sed Rate: 52 mm/hr — ABNORMAL HIGH (ref 0–20)

## 2022-12-25 LAB — MICROALBUMIN / CREATININE URINE RATIO
Creatinine,U: 164.1 mg/dL
Microalb Creat Ratio: 5.7 mg/g (ref 0.0–30.0)
Microalb, Ur: 9.3 mg/dL — ABNORMAL HIGH (ref 0.0–1.9)

## 2022-12-25 LAB — HEMOGLOBIN A1C: Hgb A1c MFr Bld: 6.5 % (ref 4.6–6.5)

## 2022-12-25 LAB — VITAMIN B12: Vitamin B-12: 289 pg/mL (ref 211–911)

## 2022-12-25 LAB — TSH: TSH: 2.08 u[IU]/mL (ref 0.35–5.50)

## 2022-12-25 NOTE — Assessment & Plan Note (Addendum)
MRI lumbar spine 02/2021, minimal degenerative disc disease at L5-S1 without stenosis or impingment. Mild disc dessication with tiny central disc protrusion.  I suspect this is worsened by sitting down often, immobility.  Pt states she has completed physical therapy, wasn't helpful for her.  Referral placed to neurospine to evaluate further as multiple radiculopathy concerns.  Spent over 60 minutes in clinic with pt going over acute and chronic concerns as well as looking over multiple imaging impressions, doctor consults with specialists, and prior lab work.

## 2022-12-25 NOTE — Addendum Note (Signed)
Addended by: Gabriel Rainwater on: 12/25/2022 04:47 PM   Modules accepted: Orders

## 2022-12-25 NOTE — Telephone Encounter (Signed)
Can you please update these medications in the chart [facility]?

## 2022-12-25 NOTE — Patient Instructions (Addendum)
Stop by the lab prior to leaving today. I will notify you of your results once received.    Double check at home,  Are you taking cymbalta and or effexor and if taking effexor are you taking 37.5 mg twice daily?   CALL NEUROLOGY OFFICE TO RESCHEDULE APPOINTMENT Was due for follow up April of 2023.  They will continue to manage your intracranial hypertension.   I need an updated medication list.  You do not have metformin on your list currently.   Start b12 1000 mcg once daily over the counter. This can help with nerve pain   A referral was placed today for psychiatry and psychology and also neuro-spine.  Please let us know if you have not heard back within 2 weeks about the referral.   Regards,   Glorya Bartley FNP-C

## 2022-12-25 NOTE — Telephone Encounter (Signed)
Medications has been updated. Please see additional new concerns from the patient.
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
## 2022-12-25 NOTE — Assessment & Plan Note (Signed)
Pt overdue for appt with neurology, did advise her to call to reschedule f/u appt with neurologist as pt with increasing worsening headaches as well. Reviewed CT head w/o contrast 4/23 partially empty sella

## 2022-12-25 NOTE — Assessment & Plan Note (Signed)
MRI cervical spine: lordosis. Trace anterolisthesis c4-c5, no significant spinal canal stenosis or neural foraminal narrowing , overall not much to account for radiculopathy   Referral placed for neurosurgeon to evaluate further for ongoing pain

## 2022-12-25 NOTE — Assessment & Plan Note (Addendum)
Pt continuing to wear brace at night time.  Referral placed to neurosurgeon to evaluate further. Possible carpal tunnel Right hand xray 01/09/21, no acute abnormality. Pt currently in a brace.

## 2022-12-26 ENCOUNTER — Other Ambulatory Visit: Payer: Self-pay | Admitting: Family

## 2022-12-26 DIAGNOSIS — R809 Proteinuria, unspecified: Secondary | ICD-10-CM

## 2022-12-26 DIAGNOSIS — E559 Vitamin D deficiency, unspecified: Secondary | ICD-10-CM

## 2022-12-26 DIAGNOSIS — G8929 Other chronic pain: Secondary | ICD-10-CM

## 2022-12-26 MED ORDER — GABAPENTIN 100 MG PO CAPS: ORAL_CAPSULE | ORAL | 3 refills | Status: DC

## 2022-12-26 MED ORDER — MELOXICAM 7.5 MG PO TABS: 7.5000 mg | ORAL_TABLET | Freq: Every day | ORAL | 0 refills | Status: DC

## 2022-12-26 MED ORDER — CHOLECALCIFEROL 1.25 MG (50000 UT) PO TABS: 1.0000 | ORAL_TABLET | ORAL | 0 refills | Status: AC

## 2022-12-26 MED ORDER — VENLAFAXINE HCL ER 75 MG PO CP24: ORAL_CAPSULE | ORAL | 0 refills | Status: DC

## 2022-12-26 MED ORDER — VENLAFAXINE HCL 37.5 MG PO TABS: ORAL_TABLET | ORAL | 0 refills | Status: DC

## 2022-12-26 MED ORDER — LOSARTAN POTASSIUM 25 MG PO TABS: 25.0000 mg | ORAL_TABLET | Freq: Every day | ORAL | 3 refills | Status: DC

## 2022-12-26 NOTE — Addendum Note (Signed)
Addended by: Mort Sawyers on: 12/26/2022 02:59 PM   Modules accepted: Orders

## 2022-12-26 NOTE — Telephone Encounter (Signed)
Please see the MyChart message reply(ies) for my assessment and plan.  The patient gave consent for this Medical Advice Message and is aware that it may result in a bill to their insurance company as well as the possibility that this may result in a co-payment or deductible. They are an established patient, but are not seeking medical advice exclusively about a problem treated during an in person or video visit in the last 7 days. I did not recommend an in person or video visit within 7 days of my reply.  I spent a total of 8 minutes cumulative time within 7 days through [employer] Mort Sawyers, FNP

## 2022-12-28 LAB — ANTI-NUCLEAR AB-TITER (ANA TITER)
ANA TITER: 1:320 {titer} — ABNORMAL HIGH
ANA Titer 1: 1:80 {titer} — ABNORMAL HIGH

## 2022-12-28 LAB — ANA: Anti Nuclear Antibody (ANA): POSITIVE — AB

## 2022-12-28 LAB — RHEUMATOID FACTOR: Rheumatoid fact SerPl-aCnc: <10 IU/mL (ref <14)

## 2022-12-30 ENCOUNTER — Other Ambulatory Visit: Payer: Self-pay | Admitting: Family

## 2022-12-30 DIAGNOSIS — G8929 Other chronic pain: Secondary | ICD-10-CM

## 2022-12-30 DIAGNOSIS — R768 Other specified abnormal immunological findings in serum: Secondary | ICD-10-CM

## 2022-12-30 DIAGNOSIS — R700 Elevated erythrocyte sedimentation rate: Secondary | ICD-10-CM

## 2022-12-30 DIAGNOSIS — M5412 Radiculopathy, cervical region: Secondary | ICD-10-CM

## 2022-12-31 MED ORDER — MELOXICAM 15 MG PO TABS: 15.0000 mg | ORAL_TABLET | Freq: Every day | ORAL | 0 refills | Status: AC

## 2022-12-31 MED ORDER — UMECLIDINIUM-VILANTEROL 62.5-25 MCG/ACT IN AEPB: 1.0000 | INHALATION_SPRAY | Freq: Every day | RESPIRATORY_TRACT | Status: DC

## 2022-12-31 NOTE — Addendum Note (Signed)
Addended by: Mort Sawyers on: 12/31/2022 02:20 PM   Modules accepted: Orders

## 2023-01-01 ENCOUNTER — Encounter: Payer: Self-pay | Admitting: *Deleted

## 2023-01-01 DIAGNOSIS — J45909 Unspecified asthma, uncomplicated: Secondary | ICD-10-CM | POA: Insufficient documentation

## 2023-01-07 ENCOUNTER — Other Ambulatory Visit: Payer: Self-pay | Admitting: Physician Assistant

## 2023-01-07 DIAGNOSIS — R519 Headache, unspecified: Secondary | ICD-10-CM

## 2023-01-07 DIAGNOSIS — H539 Unspecified visual disturbance: Secondary | ICD-10-CM

## 2023-01-10 NOTE — Progress Notes (Addendum)
Referring Physician:  Mort Sawyers, FNP [address] [place],  [place] [address]  Primary Physician:  Mort Sawyers, FNP  History of Present Illness: 01/14/2023 Ms. Teresa Meza has a history of chronic pain syndrome, migraines, GERD, DM, and obesity.   Slipped at work 2 years ago and she's had chronic right sided pain since that time. She has been very inactive since the fall and basically stays in her recliner. This was initially a WC case. It has been closed.   She has constant neck pain that radiates into the right arm/hand with numbness/tingling in the arm. She has weakness in right arm as well. She is wearing a wrist splint with minimal relief. No left arm pain.   Also with constant  LBP with constant posterior right leg pain to her foot. She has weakness, numbness,and tingling in her right leg. She has intermittent numbness in left foot. No left leg pain.   Pain is worse with prolonged sitting or standing. No alleviating factors.   She sees neurology at [facility]. Scheduled for an EMG of right arm and leg. Had lower extremity EMG done at [facility] pain previously.   Bowel/Bladder Dysfunction: none  Conservative measures:  Physical therapy:  did PT for about a year after fall ([facility] and [facility]) Multimodal medical therapy including regular antiinflammatories: neurontin, mobic Injections: no epidural steroid injections has had some injections at the ER.   Past Surgery: no spinal surgery  Teresa Meza has no symptoms of cervical myelopathy. Has some dexterity issues with right hand due to pain.   The symptoms are causing a significant impact on the patient's life.   Review of Systems:  A 10 point review of systems is negative, except for the pertinent positives and negatives detailed in the HPI.  Past Medical History: Past Medical History:  Diagnosis Date   Advanced maternal age in multigravida, unspecified trimester 10/23/2017   Ectopic fetus    Elderly  multigravida 08/26/2017   [ ]  NIPT [ ]  NST/AFI weekly starting at 36 weeks   Elevated blood pressure reading without diagnosis of hypertension 02/10/2018   Gestational diabetes mellitus (GDM) affecting fourth pregnancy 10/14/2017   Hx of gestational diabetes in prior pregnancy, currently pregnant 08/26/2017   [ ]  early 1GTT   Indication for care in labor and delivery, antepartum 01/24/2018   Labor and delivery indication for care or intervention 10/24/2017   Obesity in pregnancy 10/08/2017   Preeclampsia, third trimester 02/10/2018   Supervision of high risk pregnancy, antepartum 08/26/2017     Clinic [facility] Prenatal Labs  Dating  6wk [place] Blood type: --/--/B POS  Genetic Screen  NIPS: Normal XY   Antibody:Negative (02/05 1516)  Anatomic [place]  Rubella: 4.25 (02/05 1516) Varicella: Immune  GTT Early: 1hr:189       28 wk:      RPR: Non Reactive (02/05 1516)   Rhogam  Not needed HBsAg: Negative (02/05 1516)   TDaP vaccine                       HIV: Non Reactive (02/05 1516)   Flu Shot   Dec    Past Surgical History: Past Surgical History:  Procedure Laterality Date   ECTOPIC PREGNANCY SURGERY     ESOPHAGOGASTRODUODENOSCOPY (EGD) WITH PROPOFOL N/A 08/07/2021   Procedure: ESOPHAGOGASTRODUODENOSCOPY (EGD) WITH PROPOFOL;  Surgeon: Toney Reil, MD;  Location: [facility] ENDOSCOPY;  Service: Gastroenterology;  Laterality: N/A;    Allergies: Allergies as  of 01/14/2023 - Review Complete 12/25/2022  Allergen Reaction Noted   Sulfa antibiotics Hives, Itching, and Nausea Only 11/22/2015   Sulfur dioxide Hives 11/01/2021    Medications: Outpatient Encounter Medications as of 01/14/2023  Medication Sig   Cholecalciferol 1.25 MG (50000 UT) TABS Take 1 tablet by mouth once a week.   gabapentin (NEURONTIN) 100 MG capsule Take one tablet qam , one tablet mid day, and three tablets at bedtime   levonorgestrel (MIRENA) 20 MCG/DAY IUD 1 each by Intrauterine route once.   losartan (COZAAR) 25 MG tablet Take 1 tablet  (25 mg total) by mouth daily.   meloxicam (MOBIC) 15 MG tablet Take 1 tablet (15 mg total) by mouth daily.   metFORMIN (GLUCOPHAGE) 500 MG tablet Take by mouth 2 (two) times daily with a meal.   sucralfate (CARAFATE) 1 g tablet Take 1 g by mouth 4 (four) times daily.   umeclidinium-vilanterol (ANORO ELLIPTA) 62.5-25 MCG/ACT AEPB Inhale 1 puff into the lungs daily at 6 (six) AM.   venlafaxine (EFFEXOR) 37.5 MG tablet Take one tablet along with one (75 mg) tablet for total of 112.5 mg once daily   venlafaxine XR (EFFEXOR XR) 75 MG 24 hr capsule Take one tablet along with one (37.5 mg) tablet for total of 112.5 mg once daily   No facility-administered encounter medications on file as of 01/14/2023.    Social History: Social History   Tobacco Use   Smoking status: Never   Smokeless tobacco: Never  Vaping Use   Vaping Use: Never used  Substance Use Topics   Alcohol use: No   Drug use: No    Family Medical History: Family History  Problem Relation Age of Onset   Diabetes Mellitus II Mother    Arthritis Mother    Dementia Mother    Mental illness Mother    Bipolar disorder Father    Bipolar disorder Brother    Liver cancer Maternal Uncle    Kidney cancer Maternal Uncle     Physical Examination: There were no vitals filed for this visit.  General: Patient is well developed, well nourished. She is tearful due to pain. Light is hurting her eyes, so they were turned off.    Respiratory: Patient is breathing without any difficulty.   NEUROLOGICAL:     Awake, alert, oriented to person, place, and time.  Speech is clear and fluent. Fund of knowledge is appropriate.   Facial tone is symmetric.    She has diffuse posterior cervical tenderness and diffuse posterior lumbar tenderness.   No abnormal lesions on exposed skin.   Strength: Side Biceps Triceps Deltoid Interossei Grip Wrist Ext. Wrist Flex.  R -- -- -- -- -- -- --  L 5 5 5 5 5 5 5    Side Iliopsoas Quads Hamstring PF  DF EHL  R See below See below See below See below See below See below  L 5 5 5 5 5 5    She is wearing wrist splint on right wrist. She does not cooperate with strength testing in right arm due to severe pain. She holds arm close to her with elbow flexed. Fingers are warm to touch.   She does not give good effort with strength testing in right leg, but has at least 4+/5 strength.   Reflexes are 1+ and symmetric at the biceps and brachioradialis on left, not tested on right. Reflexes are 1+ symmetric at the patella and achilles.   Hoffman's is absent.   Bilateral upper  and lower extremity sensation is intact to light touch, but diminished in right arm and leg compared to left.   Gait not tested. She is in a WC.   Medical Decision Making  Imaging: MRI of cervical spine dated 10/03/21:  FINDINGS: Evaluation is somewhat limited by motion artifact.   Alignment: Straightening of the normal cervical lordosis. Mild reversal. Redemonstrated trace anterolisthesis C4 on C5.   Vertebrae: No acute fracture or suspicious osseous lesion.   Cord: Normal signal and morphology.   Posterior Fossa, vertebral arteries, paraspinal tissues: Negative.   Disc levels:   C2-C3: No significant disc bulge. No spinal canal stenosis or neuroforaminal narrowing.   C3-C4: No significant disc bulge. No spinal canal stenosis or neuroforaminal narrowing.   C4-C5: No significant disc bulge. No spinal canal stenosis or neuroforaminal narrowing.   C5-C6: No significant disc bulge. No spinal canal stenosis or neuroforaminal narrowing.   C6-C7: No significant disc bulge. No spinal canal stenosis or neuroforaminal narrowing.   C7-T1: No significant disc bulge. No spinal canal stenosis or neuroforaminal narrowing.   IMPRESSION: No significant spinal canal stenosis or neural foraminal narrowing.     Electronically Signed   By: Wiliam Ke M.D.   On: 10/03/2021 21:16   MRI of lumbar spine dated  03/18/21:  FINDINGS: Segmentation:  Standard.   Alignment:  Physiologic.   Vertebrae:  No fracture, evidence of discitis, or bone lesion.   Conus medullaris and cauda equina: Conus extends to the L1 level. Conus and cauda equina appear normal.   Paraspinal and other soft tissues: Negative.   Disc levels:   T12-L1 to L4-L5:  Negative.   L5-S1: Mild disc desiccation with tiny central disc protrusion. No stenosis.   IMPRESSION: 1. Minimal degenerative disc disease at L5-S1. No stenosis or impingement.     Electronically Signed   By: Obie Dredge M.D.   On: 03/18/2021 14:38    I have personally reviewed the images and agree with the above interpretation.  Assessment and Plan: Ms. Schnackenberg is a pleasant 42 y.o. female slipped at work 2 years ago and she's had chronic right sided pain since that time. Saw [facility] and pain management initially. WC case is closed.   She has constant neck pain that radiates into the right arm/hand with numbness/tingling in the arm. She has weakness in right arm as well. No left arm pain.   Previous cervical MRI from 10/03/21 showed no central or foraminal stenosis.   Also with constant  LBP with constant posterior right leg pain to her foot. She has weakness, numbness,and tingling in her right leg. She has intermittent numbness in left foot. No left leg pain.   Previous lumbar MRI from 03/18/21 showed minimal DDD L5-S1 with no central/foraminal stenosis.   Exam is limited as above due to pain.   Treatment options discussed with patient and following plan made:   - MRI of cervical spine to further evaluate cervical radiculopathy.  - MRI of lumbar spine to further evaluate lumbar radiculopathy.  - She requests wide bore MRI at GI.  - Agree with EMG scheduled with neurology.  - ROI to get EMG results for lower extremities from [facility].  - Depending on above MRI results, may consider referral to pain management.  - Once I have MRI  results, will call her to schedule a phone visit to review them.   I spent a total of 35 minutes in face-to-face and non-face-to-face activities related to this patient's care today  including review of outside records, review of imaging, review of symptoms, physical exam, discussion of differential diagnosis, discussion of treatment options, and documentation.   Thank you for involving me in the care of this patient.   ADDENDUM 01/20/23:  Reviewed with Dr. Myer Haff- depending on what the results from her workup shows, it may be appropriate to refer her to Docia Furl at [facility] to consider stimulation procedures (SCS versus Deep Brain Stim).    Drake Leach PA-C Dept. of Neurosurgery

## 2023-01-14 ENCOUNTER — Encounter: Payer: Self-pay | Admitting: Orthopedic Surgery

## 2023-01-14 ENCOUNTER — Ambulatory Visit (INDEPENDENT_AMBULATORY_CARE_PROVIDER_SITE_OTHER): Payer: Medicaid Other | Admitting: Orthopedic Surgery

## 2023-01-14 VITALS — BP 130/82 | Ht 63.5 in | Wt 217.0 lb

## 2023-01-14 DIAGNOSIS — M4722 Other spondylosis with radiculopathy, cervical region: Secondary | ICD-10-CM

## 2023-01-14 DIAGNOSIS — M5416 Radiculopathy, lumbar region: Secondary | ICD-10-CM

## 2023-01-14 DIAGNOSIS — M542 Cervicalgia: Secondary | ICD-10-CM | POA: Diagnosis not present

## 2023-01-14 DIAGNOSIS — M47812 Spondylosis without myelopathy or radiculopathy, cervical region: Secondary | ICD-10-CM

## 2023-01-14 DIAGNOSIS — M5412 Radiculopathy, cervical region: Secondary | ICD-10-CM

## 2023-01-14 DIAGNOSIS — M4726 Other spondylosis with radiculopathy, lumbar region: Secondary | ICD-10-CM | POA: Diagnosis not present

## 2023-01-14 DIAGNOSIS — M47816 Spondylosis without myelopathy or radiculopathy, lumbar region: Secondary | ICD-10-CM

## 2023-01-14 NOTE — Patient Instructions (Signed)
It was so nice to see you today. Thank you so much for coming in.    I reviewed your prior imaging of your neck and back and it showed minimal wear and tear.   I want to get an updated MRI of your neck and back to look into things further. We will get this approved through your insurance and [facility] will call you to schedule the appointment.   Keep your appointment with neurology for the EMG/nerve test.   It takes 5-7 days for me to get the MRI results back once you have them done. We will call you to set up a phone visit with me to review them.   Please do not hesitate to call if you have any questions or concerns. You can also message me in MyChart.   If you have not heard back about the MRI scans in the next week, please call the office so we can help you get them scheduled.   Drake Leach PA-C [phone]

## 2023-01-17 ENCOUNTER — Other Ambulatory Visit: Payer: Self-pay

## 2023-01-17 DIAGNOSIS — M5416 Radiculopathy, lumbar region: Secondary | ICD-10-CM

## 2023-01-17 DIAGNOSIS — M542 Cervicalgia: Secondary | ICD-10-CM

## 2023-01-17 DIAGNOSIS — M47816 Spondylosis without myelopathy or radiculopathy, lumbar region: Secondary | ICD-10-CM

## 2023-01-17 DIAGNOSIS — M5412 Radiculopathy, cervical region: Secondary | ICD-10-CM

## 2023-01-17 DIAGNOSIS — M47812 Spondylosis without myelopathy or radiculopathy, cervical region: Secondary | ICD-10-CM

## 2023-01-17 NOTE — Progress Notes (Signed)
Order for PT has been placed.

## 2023-01-27 ENCOUNTER — Encounter: Payer: Self-pay | Admitting: Physician Assistant

## 2023-01-28 ENCOUNTER — Other Ambulatory Visit: Payer: Medicaid Other

## 2023-01-29 ENCOUNTER — Other Ambulatory Visit: Payer: Medicaid Other

## 2023-02-05 ENCOUNTER — Other Ambulatory Visit: Payer: Self-pay

## 2023-02-05 ENCOUNTER — Emergency Department
Admission: EM | Admit: 2023-02-05 | Discharge: 2023-02-05 | Disposition: A | Discharge status: Home / Self Care | Payer: Medicaid Other | Source: Home / Self Care | Attending: Emergency Medicine | Admitting: Emergency Medicine

## 2023-02-05 DIAGNOSIS — R1084 Generalized abdominal pain: Secondary | ICD-10-CM | POA: Diagnosis present

## 2023-02-05 LAB — COMPREHENSIVE METABOLIC PANEL
ALT: 16 U/L (ref 0–44)
AST: 17 U/L (ref 15–41)
Albumin: 3.8 g/dL (ref 3.5–5.0)
Alkaline Phosphatase: 57 U/L (ref 38–126)
Anion gap: 3 — ABNORMAL LOW (ref 5–15)
BUN: 13 mg/dL (ref 6–20)
CO2: 26 mmol/L (ref 22–32)
Calcium: 8.9 mg/dL (ref 8.9–10.3)
Chloride: 105 mmol/L (ref 98–111)
Creatinine, Ser: 0.91 mg/dL (ref 0.44–1.00)
GFR, Estimated: >60 mL/min (ref >60)
Glucose, Bld: 127 mg/dL — ABNORMAL HIGH (ref 70–99)
Potassium: 3.9 mmol/L (ref 3.5–5.1)
Sodium: 134 mmol/L — ABNORMAL LOW (ref 135–145)
Total Bilirubin: 0.7 mg/dL (ref 0.3–1.2)
Total Protein: 8.1 g/dL (ref 6.5–8.1)

## 2023-02-05 LAB — URINALYSIS, ROUTINE W REFLEX MICROSCOPIC
Bilirubin Urine: NEGATIVE
Glucose, UA: NEGATIVE mg/dL
Hgb urine dipstick: NEGATIVE
Ketones, ur: NEGATIVE mg/dL
Leukocytes,Ua: NEGATIVE
Nitrite: NEGATIVE
Protein, ur: 30 mg/dL — AB
Specific Gravity, Urine: 1.021 (ref 1.005–1.030)
pH: 6 (ref 5.0–8.0)

## 2023-02-05 LAB — CBC
HCT: 42.5 % (ref 36.0–46.0)
Hemoglobin: 14 g/dL (ref 12.0–15.0)
MCH: 28.1 pg (ref 26.0–34.0)
MCHC: 32.9 g/dL (ref 30.0–36.0)
MCV: 85.3 fL (ref 80.0–100.0)
Platelets: 314 10*3/uL (ref 150–400)
RBC: 4.98 MIL/uL (ref 3.87–5.11)
RDW: 12.7 % (ref 11.5–15.5)
WBC: 6.1 10*3/uL (ref 4.0–10.5)
nRBC: 0 % (ref 0.0–0.2)

## 2023-02-05 LAB — LIPASE, BLOOD: Lipase: 38 U/L (ref 11–51)

## 2023-02-05 LAB — POC URINE PREG, ED: Preg Test, Ur: NEGATIVE

## 2023-02-05 MED ORDER — SUCRALFATE 1 G PO TABS: 1.0000 g | ORAL_TABLET | Freq: Four times a day (QID) | ORAL | 0 refills | Status: AC

## 2023-02-05 MED ORDER — ALUM & MAG HYDROXIDE-SIMETH 200-200-20 MG/5ML PO SUSP
30.0000 mL | Freq: Once | ORAL | Status: AC
  Administered 2023-02-05: 30 mL via ORAL
  Filled 2023-02-05: qty 30

## 2023-02-05 MED ORDER — HYDROMORPHONE HCL 1 MG/ML IJ SOLN
0.5000 mg | Freq: Once | INTRAMUSCULAR | Status: AC
  Administered 2023-02-05: 0.5 mg via INTRAVENOUS

## 2023-02-05 MED ORDER — LIDOCAINE VISCOUS HCL 2 % MT SOLN
15.0000 mL | Freq: Once | OROMUCOSAL | Status: AC
  Administered 2023-02-05: 15 mL via ORAL
  Filled 2023-02-05: qty 15

## 2023-02-05 MED ORDER — HYDROMORPHONE HCL 1 MG/ML IJ SOLN
0.5000 mg | Freq: Once | INTRAMUSCULAR | Status: DC
  Filled 2023-02-05: qty 0.5

## 2023-02-05 MED ORDER — ONDANSETRON 4 MG PO TBDP
4.0000 mg | ORAL_TABLET | Freq: Once | ORAL | Status: AC
  Administered 2023-02-05: 4 mg via ORAL
  Filled 2023-02-05: qty 1

## 2023-02-05 MED ORDER — HYDROCODONE-ACETAMINOPHEN 5-325 MG PO TABS: 1.0000 | ORAL_TABLET | ORAL | 0 refills | Status: AC | PRN

## 2023-02-05 NOTE — Discharge Instructions (Signed)
As we discussed please take your sucralfate tablets before breakfast, lunch, dinner and before going to bed for the next 2 weeks.  You may take your pain medication as needed but only as prescribed.  Do not drink alcohol or drive while taking this medication.  Please follow-up with your doctor within the next several days for recheck/reevaluation.  Return to the emergency department for any worsening abdominal pain or any other symptom personally concerning to yourself.

## 2023-02-05 NOTE — ED Provider Notes (Signed)
[facility] Provider Note    Event Date/Time   First MD Initiated Contact with Patient 02/05/23 1655     (approximate)  History   Chief Complaint: Abdominal Pain  HPI  Teresa Meza is a 42 y.o. female with a past medical history of "gastric pain" presents to the emergency department for abdominal pain.  According to the patient she has chronic gastric pain states she has seen a GI doctor for this in the past has had an endoscopy in the past.  She states her abdominal pain has been worse over the past several days to the point where she needed to come to the hospital today hoping for relief.  Patient states she does take her acid blocker every day.  Patient states the pain was causing her increased depression she called her doctor who referred her to the emergency department for evaluation.  I offered to have psychiatry see the patient in the emergency department however the patient states she would rather follow-up with her outpatient psychiatrist no SI or HI believe this is reasonable.  Physical Exam   Triage Vital Signs: ED Triage Vitals  Encounter Vitals Group     BP 02/05/23 1623 (!) 130/96     Systolic BP Percentile --      Diastolic BP Percentile --      Pulse Rate 02/05/23 1623 97     Resp 02/05/23 1623 18     Temp 02/05/23 1623 98.1 F (36.7 C)     Temp Source 02/05/23 1623 Oral     SpO2 02/05/23 1623 100 %     Weight --      Height 02/05/23 1623 5\' 3"  (1.6 m)     Head Circumference --      Peak Flow --      Pain Score 02/05/23 1622 9     Pain Loc --      Pain Education --      Exclude from Growth Chart --     Most recent vital signs: Vitals:   02/05/23 1623  BP: (!) 130/96  Pulse: 97  Resp: 18  Temp: 98.1 F (36.7 C)  SpO2: 100%    General: Awake, no distress.  CV:  Good peripheral perfusion.  Regular rate and rhythm  Resp:  Normal effort.  Equal breath sounds bilaterally.  Abd:  No distention.  Soft, mild diffuse tenderness  without focal or significant tenderness identified.  No rebound or guarding.   ED Results / Procedures / Treatments   MEDICATIONS ORDERED IN ED: Medications  ondansetron (ZOFRAN-ODT) disintegrating tablet 4 mg (4 mg Oral Given 02/05/23 1746)  alum & mag hydroxide-simeth (MAALOX/MYLANTA) 200-200-20 MG/5ML suspension 30 mL (30 mLs Oral Given 02/05/23 1746)    And  lidocaine (XYLOCAINE) 2 % viscous mouth solution 15 mL (15 mLs Oral Given 02/05/23 1746)  HYDROmorphone (DILAUDID) injection 0.5 mg (0.5 mg Intravenous Given 02/05/23 1749)     IMPRESSION / MDM / ASSESSMENT AND PLAN / ED COURSE  I reviewed the triage vital signs and the nursing notes.  Patient's presentation is most consistent with acute presentation with potential threat to life or bodily function.  Patient presents emergency department for abdominal pain more so in the mid to upper abdomen which is consistent with her chronic abdominal pain that she has suffered from for quite some time per patient.  Patient states it has gotten worse.  She follows up with a GI doctor takes her acid blocking medications.  Patient denies any fever denies any dysuria or hematuria.  Patient's lab work in the emergency department shows a normal CBC including a normal white blood cell count, reassuring chemistry including normal LFTs normal lipase.  Patient's urinalysis shows no concerning finding.  We will treat the patient's pain and reassess.  As long as the patient's pain is controlled given her normal labs reassuring physical exam and chronicity of symptoms I believe it be reasonable to have the patient follow-up with her doctor.  She also wishes to follow-up with her own psychiatrist regarding her worsening depression which she relates to the pain.   Patient is feeling much better after medications.  Will discharge with a short course of pain medication as well as 2 weeks of sucralfate.  Patient to continue taking her acid blocking medication to  follow-up with her doctor. To my typical abdominal pain return precautions. FINAL CLINICAL IMPRESSION(S) / ED DIAGNOSES   Abdominal pain    Note:  This document was prepared using Dragon voice recognition software and may include unintentional dictation errors.   Minna Antis, MD 02/05/23 1900

## 2023-02-05 NOTE — ED Triage Notes (Signed)
Patient states "I have chronic gastric pain and today it was hurting so bad that it made me sad and depressed". Patient denies SI/HI.

## 2023-02-06 ENCOUNTER — Telehealth: Payer: Self-pay

## 2023-02-06 DIAGNOSIS — F332 Major depressive disorder, recurrent severe without psychotic features: Secondary | ICD-10-CM | POA: Insufficient documentation

## 2023-02-06 DIAGNOSIS — F4312 Post-traumatic stress disorder, chronic: Secondary | ICD-10-CM | POA: Insufficient documentation

## 2023-02-06 NOTE — Transitions of Care (Post Inpatient/ED Visit) (Signed)
02/06/2023  Name: Teresa Meza MRN: [id number] DOB: 02-29-1981  Today's TOC FU Call Status: Today's TOC FU Call Status:: Successful TOC FU Call Competed TOC FU Call Complete Date: 02/06/23  Transition Care Management Follow-up Telephone Call Date of Discharge: 02/05/23 Discharge Facility: [facility]) Type of Discharge: Emergency Department Reason for ED Visit: Other: (Generalized abdominal pain) How have you been since you were released from the hospital?: Better Any questions or concerns?: No  Items Reviewed: Did you receive and understand the discharge instructions provided?: Yes Medications obtained,verified, and reconciled?: Yes (Medications Reviewed) Any new allergies since your discharge?: No Dietary orders reviewed?: Yes Do you have support at home?: Yes  Medications Reviewed Today: Medications Reviewed Today     Reviewed by Merleen Nicely, LPN (Licensed Practical Nurse) on 02/06/23 at 1215  Med List Status: <None>   Medication Order Taking? Sig Documenting Provider Last Dose Status Informant  acetaminophen (TYLENOL) 500 MG tablet [id number] Yes Take 500 mg by mouth every 6 (six) hours as needed. [provider] Taking Active   Cholecalciferol 1.25 MG (50000 UT) TABS [id number] Yes Take 1 tablet by mouth once a week. Mort Sawyers, FNP Taking Active   cyanocobalamin (VITAMIN B12) 1000 MCG tablet [id number] Yes Take 1,000 mcg by mouth daily. [provider] Taking Active   DULoxetine (CYMBALTA) 30 MG capsule [id number] Yes Take 30 mg by mouth daily. [provider] Taking Active   gabapentin (NEURONTIN) 100 MG capsule [id number] Yes Take one tablet qam , one tablet mid day, and three tablets at bedtime Mort Sawyers, FNP Taking Active   HYDROcodone-acetaminophen (NORCO/VICODIN) 5-325 MG tablet [id number] Yes Take 1 tablet by mouth every 4 (four) hours as needed. Minna Antis, MD Taking Active   levonorgestrel  (MIRENA) 20 MCG/DAY IUD [id number] Yes 1 each by Intrauterine route once. [provider] Taking Active   losartan (COZAAR) 25 MG tablet [id number] Yes Take 1 tablet (25 mg total) by mouth daily. Mort Sawyers, FNP Taking Active   Lumateperone Tosylate (CAPLYTA) 21 MG CAPS [id number] Yes Take 21 mg by mouth daily. [provider] Taking Active   meloxicam (MOBIC) 15 MG tablet [id number] Yes Take 1 tablet (15 mg total) by mouth daily. Mort Sawyers, FNP Taking Active   metFORMIN (GLUCOPHAGE) 500 MG tablet [id number] Yes Take by mouth 2 (two) times daily with a meal. [provider] Taking Active   pantoprazole (PROTONIX) 40 MG tablet [id number] Yes Take 40 mg by mouth daily. [provider] Taking Active   sucralfate (CARAFATE) 1 g tablet [id number] Yes Take 1 tablet (1 g total) by mouth 4 (four) times daily for 15 days. Minna Antis, MD Taking Active   topiramate (TOPAMAX) 25 MG tablet [id number] Yes Take 25 mg by mouth daily. [provider] Taking Active   umeclidinium-vilanterol (ANORO ELLIPTA) 62.5-25 MCG/ACT AEPB [id number] Yes Inhale 1 puff into the lungs daily at 6 (six) AM. Mort Sawyers, FNP Taking Active   venlafaxine (EFFEXOR) 37.5 MG tablet [id number] Yes Take one tablet along with one (75 mg) tablet for total of 112.5 mg once daily Mort Sawyers, FNP Taking Active   venlafaxine XR (EFFEXOR XR) 75 MG 24 hr capsule [id number] Yes Take one tablet along with one (37.5 mg) tablet for total of 112.5 mg once daily Mort Sawyers, FNP Taking Active             Home Care and Equipment/Supplies: Were Home Health Services Ordered?: NA Any new equipment or medical supplies  ordered?: NA  Functional Questionnaire: Do you need assistance with bathing/showering or dressing?: No Do you need assistance with meal preparation?: No Do you need assistance with eating?: No Do you have difficulty maintaining continence: No Do you need assistance with  getting out of bed/getting out of a chair/moving?: No Do you have difficulty managing or taking your medications?: No  Follow up appointments reviewed: PCP Follow-up appointment confirmed?: No (declined) MD Provider Line Number:[phone] Given: Yes Specialist Hospital Follow-up appointment confirmed?: No Do you need transportation to your follow-up appointment?: No Do you understand care options if your condition(s) worsen?: Yes-patient verbalized understanding    SIGNATURE  Woodfin Ganja LPN [facility] Nurse Health Advisor Direct Dial [phone]

## 2023-02-13 ENCOUNTER — Other Ambulatory Visit: Payer: Self-pay | Admitting: Physician Assistant

## 2023-02-13 DIAGNOSIS — R519 Headache, unspecified: Secondary | ICD-10-CM

## 2023-02-13 DIAGNOSIS — H93A3 Pulsatile tinnitus, bilateral: Secondary | ICD-10-CM

## 2023-02-13 DIAGNOSIS — R200 Anesthesia of skin: Secondary | ICD-10-CM

## 2023-02-13 DIAGNOSIS — H539 Unspecified visual disturbance: Secondary | ICD-10-CM

## 2023-03-05 ENCOUNTER — Telehealth: Payer: Self-pay | Admitting: Family

## 2023-03-05 NOTE — Telephone Encounter (Signed)
Patient contacted the office to make a HFU, patient asked for this appointment to be virtual. I advised patient that Tabitha's HFU visits are almost always in person, and I would have to ask Tab[facility]a. Okay to make virtual? Or keep patient in person. Patient says she is in a lot of pain and it is hard for her to get into the office.

## 2023-03-06 NOTE — Telephone Encounter (Signed)
No I do not do HFU virtual.  Pt will need to be seen in person especially if in that much pain considering her ER visit was one month ago. If too much pain she needs to be seen more urgently aka ER and or UC

## 2023-03-06 NOTE — Telephone Encounter (Signed)
Called patient reviewed information made in office visit appointment for Monday. If any new symptoms or changes she will go to Urgent care or ED.

## 2023-03-06 NOTE — Telephone Encounter (Signed)
Noted  

## 2023-03-10 ENCOUNTER — Ambulatory Visit: Payer: Medicaid Other | Admitting: Family

## 2023-03-10 ENCOUNTER — Other Ambulatory Visit: Payer: Medicaid Other

## 2023-03-18 ENCOUNTER — Other Ambulatory Visit: Payer: Self-pay

## 2023-03-19 ENCOUNTER — Ambulatory Visit (INDEPENDENT_AMBULATORY_CARE_PROVIDER_SITE_OTHER): Payer: Medicaid Other | Admitting: Gastroenterology

## 2023-03-19 ENCOUNTER — Encounter: Payer: Self-pay | Admitting: Gastroenterology

## 2023-03-19 VITALS — BP 116/84 | HR 80 | Temp 97.9°F | Ht 63.5 in | Wt 214.0 lb

## 2023-03-19 DIAGNOSIS — R3914 Feeling of incomplete bladder emptying: Secondary | ICD-10-CM | POA: Diagnosis not present

## 2023-03-19 DIAGNOSIS — R194 Change in bowel habit: Secondary | ICD-10-CM | POA: Diagnosis not present

## 2023-03-19 DIAGNOSIS — R1013 Epigastric pain: Secondary | ICD-10-CM | POA: Diagnosis not present

## 2023-03-19 DIAGNOSIS — Z8619 Personal history of other infectious and parasitic diseases: Secondary | ICD-10-CM | POA: Diagnosis not present

## 2023-03-19 NOTE — Patient Instructions (Signed)
Stop the Pantoprazole for 2 weeks before coming for your  h pylori breath test. Do not eat or drink for 1 hour before coming. our lab is open on Mondays 1pm to 4pm, Tuesdays 8:30am to 4pm. Wednesdays 8:30am to 4pm and thursdays 1pm to 4pm. The [facility] drawing stations are located at [facility] on [address], [facility] located inside the [facility] on [address] beside the [employer] and there is a [facility] in [place] on the second floor of the [employer].   1 cup full of Miralax daily with a large glass of water.

## 2023-03-19 NOTE — Progress Notes (Signed)
Arlyss Repress, MD [address]  [address]  [place], [place] [address]  Main: [phone]  Fax: [phone]    Gastroenterology Consultation  Referring Provider:     Mort Sawyers, FNP Primary Care Physician:  Mort Sawyers, FNP Primary Gastroenterologist:  Dr. Arlyss Repress Reason for Consultation: Dyspepsia, abdominal bloating        HPI:   Teresa Meza is a 41 y.o. female referred by Mort Sawyers, FNP  for consultation & management of globus sensation.  Patient is concerned about sensation of knot in her throat since December 2022.  Patient denies any trouble swallowing or sensation of food stuck in her throat.  Patient has been empirically started on Protonix 40 mg once daily since 07/19/2021 when she went to the ER.  She had an x-ray neck which was unrevealing.  Patient has significant physical impairment since she had a fall at work that resulted in significant weakness in her lower extremity.  She is undergoing physical therapy but with return of limited capacity to manage her ADLs.  Patient is also concerned about mass in her epigastric area that she notices has been getting worse.  She denies any other GI symptoms.  Follow-up visit 03/19/2023 Teresa Meza is here for follow-up of recurrence of symptoms of dyspepsia which include epigastric discomfort associated with abdominal bloating.  She does have bowel movements of variable consistency and does report sensation of incomplete emptying.  She had H. pylori infection based on the upper endoscopy in 2023, treated with triple therapy.  She did not undergo confirmation of eradication.  She is taking Protonix 40 mg once daily.  She admits to eating healthy, her meals are prepared by her husband.  Her medications are also administered by her husband.  She reports significant improvement in difficulty swallowing since dilation of esophageal web.  Patient came in wheelchair enough need by her caregiver  NSAIDs:  None  Antiplts/Anticoagulants/Anti thrombotics: None  GI Procedures:  Upper endoscopy 08/07/2021 - Normal duodenal bulb and second portion of the duodenum. - Erythematous mucosa in the gastric fundus, gastric body and antrum. Biopsied. - Esophagogastric landmarks identified. 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 Dilated. Biopsied.  DIAGNOSIS:  A. STOMACH ERYTHEMA; COLD BIOPSY:  - MODERATE CHRONIC ACTIVE HELICOBACTER-ASSOCIATED GASTRITIS.  - NEGATIVE FOR INTESTINAL METAPLASIA, DYSPLASIA, AND MALIGNANCY.   B.  ESOPHAGUS, RANDOM; COLD BIOPSY:  - UNREMARKABLE SQUAMOUS MUCOSA.  - NEGATIVE FOR INTRAEPITHELIAL EOSINOPHILS, DYSPLASIA, AND MALIGNANCY.    Past Medical History:  Diagnosis Date   Advanced maternal age in multigravida, unspecified trimester 10/23/2017   Ectopic fetus    Elderly multigravida 08/26/2017   [ ]  NIPT [ ]  NST/AFI weekly starting at 36 weeks   Elevated blood pressure reading without diagnosis of hypertension 02/10/2018   Gestational diabetes mellitus (GDM) affecting fourth pregnancy 10/14/2017   Hx of gestational diabetes in prior pregnancy, currently pregnant 08/26/2017   [ ]  early 1GTT   Indication for care in labor and delivery, antepartum 01/24/2018   Labor and delivery indication for care or intervention 10/24/2017   Obesity in pregnancy 10/08/2017   Preeclampsia, third trimester 02/10/2018   Supervision of high risk pregnancy, antepartum 08/26/2017     [facility] Prenatal Labs  Dating  6wk [place] Blood type: --/--/B POS  Genetic Screen  NIPS: Normal XY   Antibody:Negative (02/05 1516)  Anatomic [place]  Rubella: 4.25 (02/05 1516) Varicella: Immune  GTT Early: 1hr:189       28  wk:      RPR: Non Reactive (02/05 1516)   Rhogam  Not needed HBsAg: Negative (02/05 1516)   TDaP vaccine                       HIV: Non Reactive (02/05 1516)   Flu Shot   Dec    Past Surgical History:  Procedure Laterality Date   ECTOPIC PREGNANCY SURGERY     ESOPHAGOGASTRODUODENOSCOPY (EGD) WITH  PROPOFOL N/A 08/07/2021   Procedure: ESOPHAGOGASTRODUODENOSCOPY (EGD) WITH PROPOFOL;  Surgeon: Toney Reil, MD;  Location: [facility] ENDOSCOPY;  Service: Gastroenterology;  Laterality: N/A;    Current Outpatient Medications:    CAPLYTA 42 MG capsule, Take 42 mg by mouth at bedtime., Disp: , Rfl:    Cholecalciferol 1.25 MG (50000 UT) TABS, Take 1 tablet by mouth once a week., Disp: 8 tablet, Rfl: 0   clonazePAM (KLONOPIN) 0.5 MG tablet, Take 0.5 mg by mouth 2 (two) times daily., Disp: , Rfl:    cyanocobalamin (VITAMIN B12) 1000 MCG tablet, Take 1,000 mcg by mouth daily., Disp: , Rfl:    DULoxetine (CYMBALTA) 30 MG capsule, Take 30 mg by mouth daily., Disp: , Rfl:    gabapentin (NEURONTIN) 100 MG capsule, Take one tablet qam , one tablet mid day, and three tablets at bedtime, Disp: 150 capsule, Rfl: 3   HYDROcodone-acetaminophen (NORCO/VICODIN) 5-325 MG tablet, Take 1 tablet by mouth every 4 (four) hours as needed., Disp: 8 tablet, Rfl: 0   losartan (COZAAR) 25 MG tablet, Take 1 tablet (25 mg total) by mouth daily., Disp: 90 tablet, Rfl: 3   meloxicam (MOBIC) 15 MG tablet, Take 1 tablet (15 mg total) by mouth daily., Disp: 30 tablet, Rfl: 0   metFORMIN (GLUCOPHAGE) 500 MG tablet, Take by mouth 2 (two) times daily with a meal., Disp: , Rfl:    pantoprazole (PROTONIX) 40 MG tablet, Take 40 mg by mouth daily., Disp: , Rfl:    prazosin (MINIPRESS) 1 MG capsule, Take 1 mg by mouth at bedtime., Disp: , Rfl:    sucralfate (CARAFATE) 1 g tablet, Take 1 tablet (1 g total) by mouth 4 (four) times daily for 15 days., Disp: 60 tablet, Rfl: 0   umeclidinium-vilanterol (ANORO ELLIPTA) 62.5-25 MCG/ACT AEPB, Inhale 1 puff into the lungs daily at 6 (six) AM., Disp: 1 each, Rfl:    topiramate (TOPAMAX) 50 MG tablet, Take 50 mg by mouth daily. (Patient not taking: Reported on 03/19/2023), Disp: , Rfl:   Family History  Problem Relation Age of Onset   Diabetes Mellitus II Mother    Arthritis Mother    Dementia  Mother    Mental illness Mother    Bipolar disorder Father    Bipolar disorder Brother    Liver cancer Maternal Uncle    Kidney cancer Maternal Uncle      Social History   Tobacco Use   Smoking status: Never   Smokeless tobacco: Never  Vaping Use   Vaping status: Never Used  Substance Use Topics   Alcohol use: No   Drug use: No    Allergies as of 03/19/2023 - Review Complete 03/19/2023  Allergen Reaction Noted   Sulfa antibiotics Hives, Itching, and Nausea Only 11/22/2015   Sulfur dioxide Hives 11/01/2021    Review of Systems:    All systems reviewed and negative except where noted in HPI.   Physical Exam:  BP 116/84 (BP Location: Right Arm, Patient Position: Sitting, Cuff Size: Large)  Pulse 80   Temp 97.9 F (36.6 C) (Oral)   Ht 5' 3.5" (1.613 m)   Wt 214 lb (97.1 kg)   BMI 37.31 kg/m  No LMP recorded. (Menstrual status: IUD).  General:   Alert,  Well-developed, well-nourished, pleasant and cooperative in NAD Head:  Normocephalic and atraumatic. Eyes:  Sclera clear, no icterus.   Conjunctiva pink. Ears:  Normal auditory acuity. Nose:  No deformity, discharge, or lesions. Mouth:  No deformity or lesions,oropharynx pink & moist. Neck:  Supple; no masses or thyromegaly. Lungs:  Respirations even and unlabored.  Clear throughout to auscultation.   No wheezes, crackles, or rhonchi. No acute distress. Heart:  Regular rate and rhythm; no murmurs, clicks, rubs, or gallops. Abdomen:  Normal bowel sounds. Soft, non-tender and non-distended, a mobile palpable subcutaneous lesion in the epigastric area, likely lipoma or a fat-containing ventral hernia noted.  No guarding or rebound tenderness.   Rectal: Not performed Msk:  Symmetrical without gross deformities. Good, equal movement & strength bilaterally. Pulses:  Normal pulses noted. Extremities:  No clubbing or edema.  No cyanosis. Neurologic:  Alert and oriented x3;  grossly normal neurologically. Skin:  Intact  without significant lesions or rashes. No jaundice. Lymph Nodes:  No significant cervical adenopathy. Psych:  Alert and cooperative. Normal mood and affect.  Imaging Studies: Reviewed  Assessment and Plan:   Teresa Meza is a 42 y.o. female with history of chronic headache, idiopathic intracranial hypertension, s/p fall resulting in significant physical impairment, dependent on wheelchair, history of H. pylori infection status posttreatment with triple therapy, esophageal web status post dilation is seen in consultation for recurrence of symptoms of dyspepsia and change in bowel habits, with incomplete emptying  Dyspepsia, history of H. pylori status post triple therapy Recommend H. pylori breath test when off PPI for 2 weeks Stop Protonix at least for 2 weeks No known cholelithiasis based on right upper quadrant ultrasound in 2023   Irregular bowel habits with sensation of incomplete emptying  Trial of MiraLAX 17 g daily with large cup of water Continue high-fiber diet and adequate intake of water  Follow up as needed   Arlyss Repress, MD

## 2023-04-07 ENCOUNTER — Ambulatory Visit: Payer: Medicaid Other | Admitting: Primary Care

## 2023-05-08 ENCOUNTER — Encounter: Payer: Self-pay | Admitting: Internal Medicine

## 2023-05-08 ENCOUNTER — Ambulatory Visit: Payer: Medicaid Other | Attending: Internal Medicine | Admitting: Internal Medicine

## 2023-05-08 VITALS — Resp 16 | Ht 63.0 in | Wt 214.0 lb

## 2023-05-08 DIAGNOSIS — R768 Other specified abnormal immunological findings in serum: Secondary | ICD-10-CM

## 2023-05-08 DIAGNOSIS — M25531 Pain in right wrist: Secondary | ICD-10-CM | POA: Diagnosis not present

## 2023-05-08 DIAGNOSIS — M797 Fibromyalgia: Secondary | ICD-10-CM

## 2023-05-08 DIAGNOSIS — G8929 Other chronic pain: Secondary | ICD-10-CM

## 2023-05-08 NOTE — Patient Instructions (Signed)
I recommend checking out the [facility] patient-centered guide for fibromyalgia and chronic pain management: [email or web address]
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
## 2023-05-09 ENCOUNTER — Other Ambulatory Visit: Payer: Self-pay | Admitting: Family

## 2023-05-09 DIAGNOSIS — E559 Vitamin D deficiency, unspecified: Secondary | ICD-10-CM

## 2023-05-12 ENCOUNTER — Other Ambulatory Visit: Payer: Self-pay | Admitting: Family

## 2023-05-12 DIAGNOSIS — Z1231 Encounter for screening mammogram for malignant neoplasm of breast: Secondary | ICD-10-CM

## 2023-05-12 LAB — SJOGRENS SYNDROME-A EXTRACTABLE NUCLEAR ANTIBODY: SSA (Ro) (ENA) Antibody, IgG: <1 AI

## 2023-05-12 LAB — RNP ANTIBODY: Ribonucleic Protein(ENA) Antibody, IgG: <1 AI

## 2023-05-12 LAB — ANTI-DNA ANTIBODY, DOUBLE-STRANDED: ds DNA Ab: <1 [IU]/mL

## 2023-05-12 LAB — SJOGRENS SYNDROME-B EXTRACTABLE NUCLEAR ANTIBODY: SSB (La) (ENA) Antibody, IgG: <1 AI

## 2023-05-12 LAB — C3 AND C4
C3 Complement: 147 mg/dL (ref 83–193)
C4 Complement: 30 mg/dL (ref 15–57)

## 2023-05-12 LAB — RHEUMATOID FACTOR: Rheumatoid fact SerPl-aCnc: <10 [IU]/mL (ref <14)

## 2023-05-12 LAB — CYCLIC CITRUL PEPTIDE ANTIBODY, IGG: Cyclic Citrullin Peptide Ab: <16 U

## 2023-05-12 LAB — ANTI-SMITH ANTIBODY: ENA SM Ab Ser-aCnc: <1 AI

## 2023-05-12 LAB — SEDIMENTATION RATE: Sed Rate: 28 mm/h — ABNORMAL HIGH (ref 0–20)

## 2023-05-13 ENCOUNTER — Telehealth: Payer: Self-pay | Admitting: *Deleted

## 2023-05-13 NOTE — Telephone Encounter (Signed)
Patient contacted the office stating she is having "deep sharp aching pain in inner left hip". Patient states it is a "burning sensation." Patient states the pain shoots down her left leg. Patient states her hip is sore to the touch. Patient states the pain "radiate to her rectum". Patient states laying and sitting cause the pain to be worse and elevation of her leg helps some. Patient states she would also like to discuss her lab results.

## 2023-05-19 ENCOUNTER — Other Ambulatory Visit: Payer: Self-pay

## 2023-05-19 ENCOUNTER — Emergency Department: Payer: Medicaid Other

## 2023-05-19 ENCOUNTER — Emergency Department
Admission: EM | Admit: 2023-05-19 | Discharge: 2023-05-19 | Disposition: A | Discharge status: Home / Self Care | Payer: Medicaid Other | Attending: Emergency Medicine | Admitting: Emergency Medicine

## 2023-05-19 DIAGNOSIS — R102 Pelvic and perineal pain: Secondary | ICD-10-CM | POA: Insufficient documentation

## 2023-05-19 DIAGNOSIS — R1032 Left lower quadrant pain: Secondary | ICD-10-CM | POA: Insufficient documentation

## 2023-05-19 LAB — POC URINE PREG, ED: Preg Test, Ur: NEGATIVE

## 2023-05-19 MED ORDER — MORPHINE SULFATE (PF) 4 MG/ML IV SOLN
4.0000 mg | Freq: Once | INTRAVENOUS | Status: AC
  Administered 2023-05-19: 4 mg via INTRAMUSCULAR
  Filled 2023-05-19: qty 1

## 2023-05-19 MED ORDER — KETOROLAC TROMETHAMINE 15 MG/ML IJ SOLN
30.0000 mg | Freq: Once | INTRAMUSCULAR | Status: AC
  Administered 2023-05-19: 30 mg via INTRAMUSCULAR
  Filled 2023-05-19: qty 2

## 2023-05-19 NOTE — ED Triage Notes (Signed)
Pt reports L flank and pelvic pain. Reports that the pain is on the side where her IUD is placed. New IUD was placed 5-6 months ago. States the pain goes through to her hip and back as well. Denies vaginal bleeding but reports vaginal burning on L side. Pt also reports concern for her rheumatoid inflammatory markers being elevated. Pt has several other complaints.   Pt is alert and oriented. Breathing unlabored. Speaking in full sentences.

## 2023-05-19 NOTE — Discharge Instructions (Addendum)
Fortunately your ultrasound did not show any emergency conditions that explain your pain.  Please follow-up with your gynecologist for further discussion about your IUD, pelvic pain.  Thank you for choosing [facility] for your health care today!  Please see your primary doctor this week for a follow up appointment.   If you have any new, worsening, or unexpected symptoms call your doctor right away or come back to the emergency department for reevaluation.  It was my pleasure to care for you today.   Daneil Dan Modesto Charon, MD

## 2023-05-19 NOTE — ED Provider Notes (Signed)
[facility] Provider Note    Event Date/Time   First MD Initiated Contact with Patient 05/19/23 [phone]     (approximate)   History   Flank Pain and multiple complaints    HPI  Teresa Meza is a 42 y.o. female   Past medical history of chronic pain and fibromyalgia who presents to the emergency department with pelvic pain.  This pelvic pain developed after getting a new IUD placed around 2 months ago.  Sharp pain to the left side pelvic area that is worse with certain positions and movements.  She denies vaginal bleeding or discharge.  She denies dysuria or frequency.  She denies abdominal pain nausea vomiting or diarrhea.  She has no other acute medical complaints.   External Medical Documents Reviewed: Rheumatology note from 05/08/2023 document past medical history of chronic pain and medications      Physical Exam   Triage Vital Signs: ED Triage Vitals  Encounter Vitals Group     BP 05/19/23 0645 121/86     Systolic BP Percentile --      Diastolic BP Percentile --      Pulse Rate 05/19/23 0645 92     Resp 05/19/23 0645 18     Temp 05/19/23 0645 98.3 F (36.8 C)     Temp Source 05/19/23 0645 Oral     SpO2 05/19/23 0645 97 %     Weight 05/19/23 0644 213 lb (96.6 kg)     Height 05/19/23 0644 5\' 3"  (1.6 m)     Head Circumference --      Peak Flow --      Pain Score 05/19/23 0643 10     Pain Loc --      Pain Education --      Exclude from Growth Chart --     Most recent vital signs: Vitals:   05/19/23 1052 05/19/23 1101  BP:  118/79  Pulse:  78  Resp:  12  Temp: (!) 97.5 F (36.4 C) 98.1 F (36.7 C)  SpO2:  100%    General: Awake, no distress.  CV:  Good peripheral perfusion.  Resp:  Normal effort. Abd:  No distention.  Other:  No acute distress normal vital signs.  Soft nontender abdomen to deep palpation all quadrants.  No fever.  Defers pelvic exam.   ED Results / Procedures / Treatments   Labs (all labs ordered  are listed, but only abnormal results are displayed) Labs Reviewed  POC URINE PREG, ED     I ordered and reviewed the above labs they are notable for negative pregnancy test    PROCEDURES:  Critical Care performed: No  Procedures   MEDICATIONS ORDERED IN ED: Medications  ketorolac (TORADOL) 15 MG/ML injection 30 mg (30 mg Intramuscular Given 05/19/23 0813)  morphine (PF) 4 MG/ML injection 4 mg (4 mg Intramuscular Given 05/19/23 0946)     IMPRESSION / MDM / ASSESSMENT AND PLAN / ED COURSE  I reviewed the triage vital signs and the nursing notes.                                Patient's presentation is most consistent with acute presentation with potential threat to life or bodily function.  Differential diagnosis includes, but is not limited to, ovarian torsion, displaced IUD, pregnancy related complications like ectopic pregnancy, considered but less likely urinary tract infection, intra-abdominal infection or obstruction, kidney stone  The patient is on the cardiac monitor to evaluate for evidence of arrhythmia and/or significant heart rate changes.  MDM:    This patient with pelvic pain lower left side intermittent worse with certain positions and movements, after IUD was placed 2 months ago, this pain has been intermittent for the last 1 to 2 months.  Considered ovarian torsion, pregnancy related complications, checked pregnancy test which was fortunately negative will proceed with a transvaginal ultrasound to rule out torsion and to assess the placement of the IUD which may be the cause of her pain as well.  Denies discharge, low risk for STI, deferring testing today as she had negative testing very recently.  No urine symptoms to suggest UTI  Benign abdominal exam rules against surgical abdominal pathologies.  Symptoms do not match with kidney stone.  Doubt vascular emergency given her low risk factors and consistent symptoms with this pathology.  Ultimately  after transvaginal ultrasound if no obvious causative factor identified, will discuss if she wants her IUD removed today or if she will rather follow-up with her [facility] gynecologist for removal and discussion of alternative birth control options.  -- Unremarkable workup.  Patient stable.  Elects to follow-up with gynecology.  Discharge     FINAL CLINICAL IMPRESSION(S) / ED DIAGNOSES   Final diagnoses:  Pelvic pain  LLQ pain     Rx / DC Orders   ED Discharge Orders     None        Note:  This document was prepared using Dragon voice recognition software and may include unintentional dictation errors.    Pilar Jarvis, MD 05/19/23 [phone]

## 2023-05-22 ENCOUNTER — Inpatient Hospital Stay: Admission: RE | Admit: 2023-05-22 | Payer: Medicaid Other | Source: Ambulatory Visit

## 2023-05-22 NOTE — Telephone Encounter (Signed)
Patient contacted the office for her lab results again. Patient labs were drawn on 05/08/2023. Patient wants a call back. Patient states she was concerned about the amount of inflammation. Patient states she has waited too long for results. Please advise.

## 2023-05-31 ENCOUNTER — Other Ambulatory Visit: Payer: Self-pay | Admitting: Family

## 2023-05-31 DIAGNOSIS — E559 Vitamin D deficiency, unspecified: Secondary | ICD-10-CM

## 2023-06-13 ENCOUNTER — Telehealth: Payer: Self-pay | Admitting: *Deleted

## 2023-06-13 DIAGNOSIS — R700 Elevated erythrocyte sedimentation rate: Secondary | ICD-10-CM

## 2023-06-13 DIAGNOSIS — R768 Other specified abnormal immunological findings in serum: Secondary | ICD-10-CM

## 2023-06-13 NOTE — Telephone Encounter (Signed)
Patient contacted the office regarding her lab results. Patient states she was seen on 05/08/2023 and had her labs drawn. Patient has called twice before to get her results and has not heard anything and is concerned and frustrated. Patient states she is concerned about the elevated inflammation. Patient would like a call back today regarding her lab results.

## 2023-06-16 MED ORDER — PREDNISONE 5 MG PO TABS
ORAL_TABLET | ORAL | 0 refills | Status: AC
Start: 2023-06-16 — End: 2023-07-28

## 2023-06-16 NOTE — Addendum Note (Signed)
Addended by: Fuller Plan on: 06/16/2023 09:58 AM   Modules accepted: Orders

## 2023-06-16 NOTE — Telephone Encounter (Signed)
Patient advised lab results show a mildly elevated sedmentation rate of 28. This is improved compared to those checked before coming to see [place]. The antibody tests for the positive ANA were all negative. So Dr. Dimple Casey does not believe she has lupus or a related disease. Due to previous esophagus problems ibuprofen or aleve are not recommended. If she is having continued symptoms Dr. Dimple Casey can send a prescription for low dose prednisone for a few weeks and see if this resolves her inflammation. Otherwise Dr. Dimple Casey believes she would be safe to monitor symptoms for now. We can schedule a follow up in about 2 months. Patient has scheduled a follow up for January 2025. Patient would like for you to send in a prescription for the prednisone to [facility] [address]

## 2023-06-16 NOTE — Telephone Encounter (Signed)
Patient advised prescription has been sent to the pharmacy.

## 2023-06-16 NOTE — Telephone Encounter (Signed)
Sent Rx now

## 2023-06-16 NOTE — Progress Notes (Signed)
Now addressed in telephone note

## 2023-06-16 NOTE — Telephone Encounter (Signed)
Lab results show a mildly elevated sedmentation rate of 28. This is improved compared to those checked before coming to see [place]. The antibody tests for the positive ANA were all negative. So I do not believe she has lupus or a related disease. Due to previous esophagus problems ibuprofen or aleve are not recommended. If she is having continued symptoms I can send a prescription for low dose prednisone for a few weeks and see if this resolves her inflammation. Otherwise I believe she would be safe to monitor symptoms for now. We can schedule a follow up in about 2 months.

## 2023-07-24 NOTE — Progress Notes (Signed)
 GYN Clinic Note   CC: pelvic pain   HPI: Teresa Meza 43 y.o. [phone] with a hx of obesity, T2DM, anxiety, OSA, migraines, fibromyalgia, chronic pain, and HTN who presents for pelvic pain.   Reports pelvic pain intermittently since placement of Liletta IUD in 2019. Reports sharp, stabbing, and heating pain in the vagina and shots up into left pelvis. Occurs a few times a week. Unaffected by movement and eating. Worse with intercourse. Denies nausea/ vomiting, fevers. Denies abnormal vaginal discharge. Reports previously had Mirena IUD and did not have pain with this.   Seen in the ED on 05/19/23 for pelvic pain. Noted, labs, and imaging reviewed. UPT negative. Pelvic ultrasound wnl (see below).   Last GYN visit: Bobbette Brunswick, CNM in 04/02/21 PCP: No Pcp  ROS: All other systems reviewed and negative   PMHx: Past Medical History:  Diagnosis Date  . Allergy Sulfur  . Anxiety   . Cervicalgia   . Chronic pain syndrome   . Depression June 11 22  . Diabetes mellitus without complication (CMS/HHS-HCC)   . Globus sensation   . H/O gestational diabetes in prior pregnancy, currently pregnant (HHS-HCC)   . Loss of sensation    in right arm  . Migraines   . Obesity (BMI 30-39.9), unspecified   . Sleep apnea June 11 22      PSHx: Past Surgical History:  Procedure Laterality Date  . PELVIC LAPAROSCOPY  2005   Right ectopic  . Liletta IUD insertion  04/13/2018  . EGD  08/07/2021  . LAPAROSCOPIC REMOVAL ECTOPIC PREGNANCY       OBHx: OB History  Gravida Para Term Preterm AB Living  4 3 3   1 3   SAB IAB Ectopic Molar Multiple Live Births      1     3    # Outcome Date GA Lbr Len/2nd Weight Sex Type Anes PTL Lv  4 Term 02/14/18 [redacted]w[redacted]d  3.685 kg (8 lb 2 oz)  Vag-Spont EPI    3 Term 10/06/13 [redacted]w[redacted]d  2.92 kg (6 lb 7 oz) F Vag-Spont     2 Term 05/08/06 [redacted]w[redacted]d  3.459 kg (7 lb 10 oz) M Vag-Spont        Complications: Gestational diabetes (HHS-HCC)  1 Ectopic 2005     ECTOPIC         GYN Hx: - LMP: No LMP recorded (lmp unknown). (Menstrual status: IUD).  - Menses: occasional spotting with Liletta - Pap hx: +history of abnormals, last 10/04/22 NILM with + HRHPV (neg 16/18). Never had any excisional procedures - STI hx: Hx chlamydia - Sexual preference: Sexually active with husband - Dyspareunia or sexual concerns: pain with intercourse - Contraceptive method: Liletta IUD 04/13/18 - Fertility desires: satisfied parity - HPV vaccination: did not receive - Abdominal surgeries: lap right salpingectomy for ectopic - GYN procedures: none    FHx: Denies FHx of ovarian, breast, uterine, cervical, and colon cancer   Meds: Current Outpatient Medications on File Prior to Visit  Medication Sig Dispense Refill  . clonazePAM (KLONOPIN) 0.5 MG tablet Take 0.5 mg by mouth once daily    . DULoxetine (CYMBALTA) 30 MG DR capsule Take 1 capsule (30 mg total) by mouth 2 (two) times daily Take 1 tablet twice daily for 2 weeks then increase to 2 tablets twice daily. 60 capsule 2  . gabapentin  (NEURONTIN ) 300 MG capsule Take 1 capsule (300 mg total) by mouth 2 (two) times daily 60 capsule 2  . lumateperone (CAPLYTA)  42 mg capsule Take 42 mg by mouth once daily    . meloxicam  (MOBIC ) 15 MG tablet Take 1 tablet (15 mg total) by mouth once daily 30 tablet 2  . prazosin (MINIPRESS) 1 MG capsule Take 1 mg by mouth at bedtime    . topiramate (TOPAMAX) 50 MG tablet Take 50 mg nightly continue. 30 tablet 2  . umeclidinium-vilanteroL (ANORO ELLIPTA ) 62.5-25 mcg/actuation inhaler Inhale 1 Puff into the lungs once daily 60 each 1  . acetaminophen  (TYLENOL ) 500 MG tablet Take by mouth (Patient not taking: Reported on 07/25/2023)    . metFORMIN  (GLUCOPHAGE ) 500 MG tablet Take 1 tablet (500 mg total) by mouth 2 (two) times daily with meals 60 tablet 11  . venlafaxine  (EFFEXOR -XR) 37.5 MG XR capsule Take 37.5 mg by mouth once daily (Patient not taking: Reported on 07/25/2023)     No current  facility-administered medications on file prior to visit.     Allergies: Allergies  Allergen Reactions  . Sulfa (Sulfonamide Antibiotics) Hives  . Sulfur Dioxide Hives     SocHx: Social History   Tobacco Use  . Smoking status: Never  . Smokeless tobacco: Never  Vaping Use  . Vaping status: Never Used  Substance Use Topics  . Alcohol use: Never  . Drug use: Never     OBJECTIVE: BP 132/76   Ht 161.3 cm (5' 3.5)   Wt 96.6 kg (213 lb)   LMP  (LMP Unknown)   BMI 37.14 kg/m    Gen: NAD HEENT: [place]/AT  Heart: Regular rate Lungs: Normal work of breathing Abd: nondistended Ext: No BLE edema  TRANSABDOMINAL AND TRANSVAGINAL ULTRASOUND OF PELVIS- 05/19/23   TECHNIQUE:  Both transabdominal and transvaginal ultrasound examinations of the pelvis were performed. Transabdominal technique was performed for global imaging of the pelvis including uterus, ovaries, adnexal regions, and pelvic cul-de-sac.   It was necessary to proceed with endovaginal exam following the transabdominal exam to visualize the endometrium and ovaries. Color and duplex Doppler ultrasound was utilized to evaluate blood flow to  the ovaries.   COMPARISON:  Pelvic ultrasound dated March 22, 2019.   FINDINGS:  Uterus   Measurements: 7.2 x 5.2 x 6.2 cm = volume: 121.2 mL. No fibroids or other mass visualized. IUD in place near the level of the uterine  fundus.   Endometrium   Thickness: 3 mm.  No focal abnormality visualized.   Right ovary   Measurements: 3.8 x 2.8 x 3.3 cm = volume: 18.2 mL. Normal appearance/no adnexal mass.   Left ovary   Measurements: 4.8 x 2.5 x 3.6 cm = volume: 22.8 mL. Normal appearance/no adnexal mass.   Pulsed Doppler evaluation of both ovaries demonstrates normal  low-resistance arterial and venous waveforms.   Other findings   No abnormal free fluid.   IMPRESSION:  1. No acute sonographic abnormality.  2. Normal sonographic appearance of the ovaries.  3. IUD  in place.    ASSESSMENT/PLAN: Teresa Meza 43 y.o. [phone] with a hx of obesity, T2DM, anxiety, OSA, migraines, fibromyalgia, chronic pain, and HTN who presents for pelvic pain.   #Pelvic pain #IUD in place - Discussed differential for pelvic pain which includes gyn causes (fibroids, endometriosis, etc), GI (constipation, IBS, etc.), and MSK. - Pelvic u/s 05/19/23 normal: 7.2x5.2x6.2cm uterus, no fibroids, IUD at fundus, normal ovaries bilaterally  - Patient think pain is due to IUD since pain started when IUD was placed. She desires removal of this one and placement of a new one (Mirena)  -  Will bring back for IUD removal and replacement  #Hx abnormal pap - 10/04/22 NILM with + HRHPV (neg 16/18) - Per [facility], plan for repeat pap smear in 1 year- March 2025   #Health maintenance: - Cervical cancer screening: see above - Breast cancer screening: due, encouraged patient to re-schedule - Defer remainder to PCP    RTC for IUD removal and replacement  DEMETRA DICIE DINSMORE, MD

## 2023-07-25 DIAGNOSIS — B977 Papillomavirus as the cause of diseases classified elsewhere: Secondary | ICD-10-CM | POA: Insufficient documentation

## 2023-07-25 DIAGNOSIS — R102 Pelvic and perineal pain unspecified side: Secondary | ICD-10-CM | POA: Insufficient documentation

## 2023-07-25 DIAGNOSIS — I10 Essential (primary) hypertension: Secondary | ICD-10-CM | POA: Insufficient documentation

## 2023-07-29 ENCOUNTER — Telehealth: Payer: Self-pay

## 2023-07-30 ENCOUNTER — Other Ambulatory Visit: Payer: Self-pay | Admitting: Family

## 2023-07-30 DIAGNOSIS — J4520 Mild intermittent asthma, uncomplicated: Secondary | ICD-10-CM

## 2023-07-30 MED ORDER — UMECLIDINIUM-VILANTEROL 62.5-25 MCG/ACT IN AEPB
1.0000 | INHALATION_SPRAY | Freq: Every day | RESPIRATORY_TRACT | 0 refills | Status: DC
Start: 2023-07-30 — End: 2023-08-29

## 2023-07-30 NOTE — Telephone Encounter (Signed)
 Spoke to pt, scheduled f/u for 08/05/23

## 2023-07-30 NOTE — Telephone Encounter (Signed)
 I did not receive a refill request however I am guessing it is the anoro. I sent in a one month supply. Pt is overdue for f/u she can schedule prior to next refill and we can discuss gabapentin further.

## 2023-08-05 ENCOUNTER — Ambulatory Visit: Payer: Medicaid Other | Admitting: Family

## 2023-08-14 ENCOUNTER — Ambulatory Visit: Payer: Medicaid Other | Admitting: Family

## 2023-08-14 ENCOUNTER — Encounter: Payer: Self-pay | Admitting: Family

## 2023-08-14 VITALS — BP 116/76 | HR 83 | Temp 98.3°F | Ht 63.0 in

## 2023-08-14 DIAGNOSIS — M797 Fibromyalgia: Secondary | ICD-10-CM | POA: Diagnosis not present

## 2023-08-14 DIAGNOSIS — M5412 Radiculopathy, cervical region: Secondary | ICD-10-CM | POA: Diagnosis not present

## 2023-08-14 DIAGNOSIS — G8929 Other chronic pain: Secondary | ICD-10-CM | POA: Diagnosis not present

## 2023-08-14 DIAGNOSIS — G932 Benign intracranial hypertension: Secondary | ICD-10-CM

## 2023-08-14 DIAGNOSIS — R809 Proteinuria, unspecified: Secondary | ICD-10-CM

## 2023-08-14 DIAGNOSIS — E119 Type 2 diabetes mellitus without complications: Secondary | ICD-10-CM | POA: Diagnosis not present

## 2023-08-14 LAB — POCT GLYCOSYLATED HEMOGLOBIN (HGB A1C): Hemoglobin A1C: 6.2 % — AB (ref 4.0–5.6)

## 2023-08-14 MED ORDER — GABAPENTIN 300 MG PO CAPS
300.0000 mg | ORAL_CAPSULE | Freq: Three times a day (TID) | ORAL | 0 refills | Status: DC
Start: 2023-08-14 — End: 2023-11-18

## 2023-08-14 NOTE — Assessment & Plan Note (Signed)
Reviewed rheumatology notes. See above for pain control modalities.  Cont with psychiatry and psychology

## 2023-08-14 NOTE — Addendum Note (Signed)
Addended by: Lovena Neighbours on: 08/14/2023 01:16 PM   Modules accepted: Orders

## 2023-08-14 NOTE — Patient Instructions (Addendum)
  I would advise reaching back out to Dr. Christena Flake office to determine next steps. I believe best course of action would be to make an appointment with them to discuss the next steps  Ask psychiatry if they would consider increasing cymbalta to 60 mg once daily for fibromyalgia pain control.   We will also increase gabapentin.  Increase to 300 mg in the am, 100 mg mid day and then 300 mg at night time New RX for 300 mg has [facility] to [facility].  With your remaining 100 mg tablets just use these for mid day.  After one week if still without pain control or reduction of pain, increase to 300 mg three times daily.   Restart vitamin D3 2000 I/U once daily over the counter.

## 2023-08-14 NOTE — Assessment & Plan Note (Signed)
Cont f/u with neurology for management

## 2023-08-14 NOTE — Assessment & Plan Note (Signed)
Followed by neurosurgery, neurology and rheumatology  We are going to titrate up gabapentin  300 mg qam, 100 mg mid day and 300 mg at night time After one week if no improvement or only slight increase to 300 mg tid if tolerating well  Can cause sedative effects, monitor.  Advised pt to f/u with neurosurgery as MRIs were not approved per her report, and per last note consideration for referral for spinal options  Did recommend that pt f/u with psychiatry to suggest increase of cymbalta for fibromyalgia

## 2023-08-14 NOTE — Progress Notes (Signed)
Established Patient Office Visit  Subjective:      CC:  Chief Complaint  Patient presents with   Medical Management of Chronic Issues    HPI: Teresa Meza is a 43 y.o. female presenting on 08/14/2023 for Medical Management of Chronic Issues  Chronic idiopathic pain syndrome, right wrist pain, ervical radiculopathy: did end up seeing neurosurgery  but she states limited due to insurance not approving MRI. She did do physical therapy in the past, states was not helpful for her.   IIH, has since re established with neurology and is being followed for headaches. She was referred to another neurologist of whom she is now seeing at [facility] with Dr. Marlene Bast. She was advised to continue cymbalta, gabapentin 300 mg twice daily , topamax 50 mg nightly for headache prevention. Neurology suggested could trial of nortriptyline if needed. Did give recommendations per note for pain clinic.   Positive ANA, seeing Dr. Dimple Casey rheumatology , diagnosed with fibromyalgia, on cymbalta 30 mg once daily.   Psychiatry: she is now on caplyta, prazosin, and clonazepam as well as cymbalta.   New complaints: Still in a lot of pain. On combo cymbalta and gabapentin curious if she can increase.      Social history:  Relevant past medical, surgical, family and social history reviewed and updated as indicated. Interim medical history since our last visit reviewed.  Allergies and medications reviewed and updated.  DATA REVIEWED: CHART IN EPIC     ROS: Negative unless specifically indicated above in HPI.    Current Outpatient Medications:    CAPLYTA 42 MG capsule, Take 42 mg by mouth at bedtime., Disp: , Rfl:    Cholecalciferol 1.25 MG (50000 UT) TABS, Take 1 tablet by mouth once a week., Disp: 8 tablet, Rfl: 0   clonazePAM (KLONOPIN) 0.5 MG tablet, Take 0.5 mg by mouth 2 (two) times daily., Disp: , Rfl:    cyanocobalamin (VITAMIN B12) 1000 MCG tablet, Take 1,000 mcg by mouth daily., Disp: , Rfl:     DULoxetine (CYMBALTA) 30 MG capsule, Take 30 mg by mouth daily., Disp: , Rfl:    gabapentin (NEURONTIN) 300 MG capsule, Take 1 capsule (300 mg total) by mouth 3 (three) times daily., Disp: 270 capsule, Rfl: 0   HYDROcodone-acetaminophen (NORCO/VICODIN) 5-325 MG tablet, Take 1 tablet by mouth every 4 (four) hours as needed., Disp: 8 tablet, Rfl: 0   levonorgestrel (MIRENA) 20 MCG/DAY IUD, by Intrauterine route., Disp: , Rfl:    losartan (COZAAR) 25 MG tablet, Take 1 tablet (25 mg total) by mouth daily., Disp: 90 tablet, Rfl: 3   meloxicam (MOBIC) 15 MG tablet, Take 1 tablet (15 mg total) by mouth daily., Disp: 30 tablet, Rfl: 0   metFORMIN (GLUCOPHAGE) 500 MG tablet, Take by mouth 2 (two) times daily with a meal., Disp: , Rfl:    pantoprazole (PROTONIX) 40 MG tablet, Take 40 mg by mouth daily., Disp: , Rfl:    prazosin (MINIPRESS) 1 MG capsule, Take 1 mg by mouth at bedtime., Disp: , Rfl:    topiramate (TOPAMAX) 50 MG tablet, Take 50 mg by mouth daily., Disp: , Rfl:    umeclidinium-vilanterol (ANORO ELLIPTA) 62.5-25 MCG/ACT AEPB, Inhale 1 puff into the lungs daily at 6 (six) AM., Disp: 1 each, Rfl: 0   sucralfate (CARAFATE) 1 g tablet, Take 1 tablet (1 g total) by mouth 4 (four) times daily for 15 days., Disp: 60 tablet, Rfl: 0      Objective:    BP  116/76 (BP Location: Left Arm, Patient Position: Sitting, Cuff Size: Large)   Pulse 83   Temp 98.3 F (36.8 C) (Temporal)   Ht 5\' 3"  (1.6 m)   SpO2 98%   BMI 37.73 kg/m   Wt Readings from Last 3 Encounters:  05/19/23 213 lb (96.6 kg)  05/08/23 214 lb (97.1 kg)  03/19/23 214 lb (97.1 kg)    Physical Exam Constitutional:      General: She is not in acute distress.    Appearance: Normal appearance. She is normal weight. She is not ill-appearing, toxic-appearing or diaphoretic.  HENT:     Head: Normocephalic.  Cardiovascular:     Rate and Rhythm: Normal rate and regular rhythm.  Pulmonary:     Effort: Pulmonary effort is normal.   Musculoskeletal:        General: Normal range of motion.  Neurological:     General: No focal deficit present.     Mental Status: She is alert and oriented to person, place, and time. Mental status is at baseline.  Psychiatric:        Mood and Affect: Mood normal.        Behavior: Behavior normal.        Thought Content: Thought content normal.        Judgment: Judgment normal.           Assessment & Plan:  Chronic idiopathic pain syndrome Assessment & Plan: Followed by neurosurgery, neurology and rheumatology  We are going to titrate up gabapentin  300 mg qam, 100 mg mid day and 300 mg at night time After one week if no improvement or only slight increase to 300 mg tid if tolerating well  Can cause sedative effects, monitor.  Advised pt to f/u with neurosurgery as MRIs were not approved per her report, and per last note consideration for referral for spinal options  Did recommend that pt f/u with psychiatry to suggest increase of cymbalta for fibromyalgia    Orders: -     Gabapentin; Take 1 capsule (300 mg total) by mouth 3 (three) times daily.  Dispense: 270 capsule; Refill: 0  Fibromyalgia Assessment & Plan: Reviewed rheumatology notes. See above for pain control modalities.  Cont with psychiatry and psychology   Orders: -     Gabapentin; Take 1 capsule (300 mg total) by mouth 3 (three) times daily.  Dispense: 270 capsule; Refill: 0  Cervical radiculopathy -     Gabapentin; Take 1 capsule (300 mg total) by mouth 3 (three) times daily.  Dispense: 270 capsule; Refill: 0  Positive for microalbuminuria -     Microalbumin / creatinine urine ratio  Diabetes mellitus without complication (HCC) -     POCT glycosylated hemoglobin (Hb A1C)  IIH (idiopathic intracranial hypertension) Assessment & Plan: Cont f/u with neurology for management       Return in about 6 months (around 02/11/2024) for f/u CPE, f/u diabetes.  Mort Sawyers, MSN, APRN, FNP-C [facility]
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
## 2023-08-18 ENCOUNTER — Ambulatory Visit: Payer: Medicaid Other | Admitting: Internal Medicine

## 2023-08-29 ENCOUNTER — Other Ambulatory Visit: Payer: Self-pay | Admitting: Family

## 2023-08-29 DIAGNOSIS — J4520 Mild intermittent asthma, uncomplicated: Secondary | ICD-10-CM

## 2023-09-02 ENCOUNTER — Other Ambulatory Visit (INDEPENDENT_AMBULATORY_CARE_PROVIDER_SITE_OTHER): Payer: Self-pay | Admitting: Radiology

## 2023-09-02 DIAGNOSIS — R809 Proteinuria, unspecified: Secondary | ICD-10-CM | POA: Diagnosis not present

## 2023-09-04 ENCOUNTER — Encounter: Payer: Self-pay | Admitting: Family

## 2023-09-04 LAB — MICROALBUMIN / CREATININE URINE RATIO
Creatinine,U: 243.8 mg/dL
Microalb Creat Ratio: 37.4 mg/g — ABNORMAL HIGH (ref 0.0–30.0)
Microalb, Ur: 9.1 mg/dL — ABNORMAL HIGH (ref 0.0–1.9)

## 2023-10-01 ENCOUNTER — Other Ambulatory Visit: Payer: Self-pay | Admitting: Family

## 2023-10-01 ENCOUNTER — Encounter: Payer: Self-pay | Admitting: Family

## 2023-10-01 DIAGNOSIS — E119 Type 2 diabetes mellitus without complications: Secondary | ICD-10-CM
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
## 2023-10-14 ENCOUNTER — Ambulatory Visit: Payer: Medicaid Other | Admitting: Internal Medicine

## 2023-10-17 ENCOUNTER — Other Ambulatory Visit: Payer: Self-pay

## 2023-10-17 ENCOUNTER — Emergency Department
Admission: EM | Admit: 2023-10-17 | Discharge: 2023-10-17 | Disposition: A | Discharge status: Home / Self Care | Attending: Emergency Medicine | Admitting: Emergency Medicine

## 2023-10-17 DIAGNOSIS — J039 Acute tonsillitis, unspecified: Secondary | ICD-10-CM | POA: Diagnosis not present

## 2023-10-17 DIAGNOSIS — E119 Type 2 diabetes mellitus without complications: Secondary | ICD-10-CM | POA: Diagnosis not present

## 2023-10-17 DIAGNOSIS — J029 Acute pharyngitis, unspecified: Secondary | ICD-10-CM | POA: Diagnosis present

## 2023-10-17 LAB — COMPREHENSIVE METABOLIC PANEL WITH GFR
ALT: 17 U/L (ref 0–44)
AST: 17 U/L (ref 15–41)
Albumin: 3.5 g/dL (ref 3.5–5.0)
Alkaline Phosphatase: 50 U/L (ref 38–126)
Anion gap: 6 (ref 5–15)
BUN: 12 mg/dL (ref 6–20)
CO2: 25 mmol/L (ref 22–32)
Calcium: 8.9 mg/dL (ref 8.9–10.3)
Chloride: 105 mmol/L (ref 98–111)
Creatinine, Ser: 0.81 mg/dL (ref 0.44–1.00)
GFR, Estimated: >60 mL/min (ref >60)
Glucose, Bld: 149 mg/dL — ABNORMAL HIGH (ref 70–99)
Potassium: 3.9 mmol/L (ref 3.5–5.1)
Sodium: 136 mmol/L (ref 135–145)
Total Bilirubin: 0.7 mg/dL (ref 0.0–1.2)
Total Protein: 7.2 g/dL (ref 6.5–8.1)

## 2023-10-17 LAB — CBC WITH DIFFERENTIAL/PLATELET
Abs Immature Granulocytes: 0.01 10*3/uL (ref 0.00–0.07)
Basophils Absolute: 0 10*3/uL (ref 0.0–0.1)
Basophils Relative: 1 %
Eosinophils Absolute: 0.1 10*3/uL (ref 0.0–0.5)
Eosinophils Relative: 2 %
HCT: 40.6 % (ref 36.0–46.0)
Hemoglobin: 13.6 g/dL (ref 12.0–15.0)
Immature Granulocytes: 0 %
Lymphocytes Relative: 26 %
Lymphs Abs: 1.6 10*3/uL (ref 0.7–4.0)
MCH: 28.8 pg (ref 26.0–34.0)
MCHC: 33.5 g/dL (ref 30.0–36.0)
MCV: 86 fL (ref 80.0–100.0)
Monocytes Absolute: 0.3 10*3/uL (ref 0.1–1.0)
Monocytes Relative: 5 %
Neutro Abs: 4 10*3/uL (ref 1.7–7.7)
Neutrophils Relative %: 66 %
Platelets: 263 10*3/uL (ref 150–400)
RBC: 4.72 MIL/uL (ref 3.87–5.11)
RDW: 12.8 % (ref 11.5–15.5)
WBC: 6.1 10*3/uL (ref 4.0–10.5)
nRBC: 0 % (ref 0.0–0.2)

## 2023-10-17 LAB — GROUP A STREP BY PCR: Group A Strep by PCR: NOT DETECTED

## 2023-10-17 LAB — MONONUCLEOSIS SCREEN: Mono Screen: NEGATIVE

## 2023-10-17 MED ORDER — AMOXICILLIN-POT CLAVULANATE 875-125 MG PO TABS: 1.0000 | ORAL_TABLET | Freq: Two times a day (BID) | ORAL | 0 refills | Status: AC

## 2023-10-17 MED ORDER — PREDNISONE 10 MG (21) PO TBPK: ORAL_TABLET | ORAL | 0 refills | Status: AC

## 2023-10-17 NOTE — Discharge Instructions (Signed)
 Please follow up with primary care if not improving over the weekend.  Return to the ER for symptoms that change or worsen.

## 2023-10-17 NOTE — ED Triage Notes (Addendum)
 Pt comes with c/o sore throat. Pt states pain, swelling and red yellow noted to back of her throat. Pt states she had sinus infection and got over it. Pt states this popped up afterwards. Pt states fever at home too

## 2023-10-17 NOTE — ED Notes (Signed)
 See triage notes. Patient c/o sore throat for the past four days. Patient stated she had a esophageal procedure about a year ago.

## 2023-10-17 NOTE — ED Provider Notes (Signed)
 [facility] Provider Note    Event Date/Time   First MD Initiated Contact with Patient 10/17/23 [phone]     (approximate)   History   Sore Throat   HPI  Teresa Meza is a 43 y.o. female  with history of IDH, DM II and as listed in EMR presents to the emergency department for evaluation of sore throat. She had URI/sinus infection about a week ago. Sore throat started 4 days ago and fever started today. Symptoms progressively worsening. She denies headache, cough, diarrhea, abdominal pain.      Physical Exam   Triage Vital Signs: ED Triage Vitals  Encounter Vitals Group     BP 10/17/23 0853 (!) 109/98     Systolic BP Percentile --      Diastolic BP Percentile --      Pulse Rate 10/17/23 0853 85     Resp 10/17/23 0853 18     Temp 10/17/23 0853 99.1 F (37.3 C)     Temp src --      SpO2 10/17/23 0853 97 %     Weight 10/17/23 0851 212 lb (96.2 kg)     Height 10/17/23 0851 5' 3.5" (1.613 m)     Head Circumference --      Peak Flow --      Pain Score 10/17/23 0853 9     Pain Loc --      Pain Education --      Exclude from Growth Chart --     Most recent vital signs: Vitals:   10/17/23 0853  BP: (!) 109/98  Pulse: 85  Resp: 18  Temp: 99.1 F (37.3 C)  SpO2: 97%    General: Awake, no distress.  CV:  Good peripheral perfusion.  Resp:  Normal effort.  Abd:  No distention.  Other:  Submental and anterior cervical nodes tender and swollen. Tonsils erythematous with exudate.   ED Results / Procedures / Treatments   Labs (all labs ordered are listed, but only abnormal results are displayed) Labs Reviewed  COMPREHENSIVE METABOLIC PANEL WITH GFR - Abnormal; Notable for the following components:      Result Value   Glucose, Bld 149 (*)    All other components within normal limits  GROUP A STREP BY PCR  MONONUCLEOSIS SCREEN  CBC WITH DIFFERENTIAL/PLATELET     EKG  Not indicated   RADIOLOGY  Image and radiology report reviewed  and interpreted by me. Radiology report consistent with the same.  Not indicated.  PROCEDURES:  Critical Care performed: No  Procedures   MEDICATIONS ORDERED IN ED:  Medications - No data to display   IMPRESSION / MDM / ASSESSMENT AND PLAN / ED COURSE   I have reviewed the triage note.  Differential diagnosis includes, but is not limited to, strep throat, mono, viral pharyngitis  Patient's presentation is most consistent with acute complicated illness / injury requiring diagnostic workup.  43 year old female presents to the ER with 4 day history of sore throat now with fever today. See HPI.  On exam, tonsils are swollen with exudate. Uvula is midline. Submental and anterior cervical nodes are swollen. She is maintaining secretions without difficulty.   CBC is normal. Nonfasting glucose is 149. Strep screen is negative. Mononucleosis test is negative. Results reviewed with the patient and family.  Based on enlarged tonsils with exudate plus fever following previous illness, will treat with Augmentin and prednisone. She is to follow up with primary care if  not improving over the weekend. ER return precautions discussed.      FINAL CLINICAL IMPRESSION(S) / ED DIAGNOSES   Final diagnoses:  Exudative tonsillitis     Rx / DC Orders   ED Discharge Orders          Ordered    amoxicillin-clavulanate (AUGMENTIN) 875-125 MG tablet  2 times daily        10/17/23 1106    predniSONE (STERAPRED UNI-PAK 21 TAB) 10 MG (21) TBPK tablet        10/17/23 1106             Note:  This document was prepared using Dragon voice recognition software and may include unintentional dictation errors.   Chinita Pester, FNP 10/17/23 1109    Janith Lima, MD 10/17/23 [phone]

## 2023-10-30 ENCOUNTER — Telehealth: Payer: Self-pay

## 2023-10-30 NOTE — Telephone Encounter (Signed)
 Patient left a message on my voicemail on 10/29/2023 and states she was seen in the ER for Tonsillitis and she is still having symptoms. Patient was seen in the ER on 10/17/2023. She states she is still have a lot of throat pain when she swallows. She states the pain radiates to her ears. She states that she also is running a fever off and on. She has finished all the antibiotics and needs to know what she should do. She will sometime and a dull aching pain in the epigastric area. Advised patient she needed to call her PCP because they would be the ones that would deal with a tonsillitis. Informed her if they clear her and they think it is coming from her esophagus to call us back.

## 2023-11-02 ENCOUNTER — Emergency Department

## 2023-11-02 ENCOUNTER — Other Ambulatory Visit: Payer: Self-pay

## 2023-11-02 ENCOUNTER — Emergency Department
Admission: EM | Admit: 2023-11-02 | Discharge: 2023-11-02 | Disposition: A | Discharge status: Home / Self Care | Attending: Emergency Medicine | Admitting: Emergency Medicine

## 2023-11-02 DIAGNOSIS — E119 Type 2 diabetes mellitus without complications: Secondary | ICD-10-CM | POA: Insufficient documentation

## 2023-11-02 DIAGNOSIS — J029 Acute pharyngitis, unspecified: Secondary | ICD-10-CM | POA: Insufficient documentation

## 2023-11-02 LAB — CBC
HCT: 44.2 % (ref 36.0–46.0)
Hemoglobin: 14.7 g/dL (ref 12.0–15.0)
MCH: 28.2 pg (ref 26.0–34.0)
MCHC: 33.3 g/dL (ref 30.0–36.0)
MCV: 84.8 fL (ref 80.0–100.0)
Platelets: 236 10*3/uL (ref 150–400)
RBC: 5.21 MIL/uL — ABNORMAL HIGH (ref 3.87–5.11)
RDW: 12.7 % (ref 11.5–15.5)
WBC: 5.1 10*3/uL (ref 4.0–10.5)
nRBC: 0 % (ref 0.0–0.2)

## 2023-11-02 LAB — BASIC METABOLIC PANEL WITH GFR
Anion gap: 6 (ref 5–15)
BUN: 9 mg/dL (ref 6–20)
CO2: 23 mmol/L (ref 22–32)
Calcium: 8.7 mg/dL — ABNORMAL LOW (ref 8.9–10.3)
Chloride: 105 mmol/L (ref 98–111)
Creatinine, Ser: 0.84 mg/dL (ref 0.44–1.00)
GFR, Estimated: >60 mL/min (ref >60)
Glucose, Bld: 170 mg/dL — ABNORMAL HIGH (ref 70–99)
Potassium: 3.7 mmol/L (ref 3.5–5.1)
Sodium: 134 mmol/L — ABNORMAL LOW (ref 135–145)

## 2023-11-02 LAB — GROUP A STREP BY PCR: Group A Strep by PCR: NOT DETECTED

## 2023-11-02 LAB — POC URINE PREG, ED: Preg Test, Ur: NEGATIVE

## 2023-11-02 MED ORDER — DEXAMETHASONE SODIUM PHOSPHATE 10 MG/ML IJ SOLN
10.0000 mg | Freq: Once | INTRAMUSCULAR | Status: AC
  Administered 2023-11-02: 10 mg via INTRAVENOUS
  Filled 2023-11-02: qty 1

## 2023-11-02 MED ORDER — KETOROLAC TROMETHAMINE 15 MG/ML IJ SOLN
15.0000 mg | Freq: Once | INTRAMUSCULAR | Status: AC
  Administered 2023-11-02: 15 mg via INTRAVENOUS
  Filled 2023-11-02: qty 1

## 2023-11-02 MED ORDER — IOHEXOL 300 MG/ML  SOLN
100.0000 mL | Freq: Once | INTRAMUSCULAR | Status: AC | PRN
  Administered 2023-11-02: 75 mL via INTRAVENOUS

## 2023-11-02 NOTE — ED Triage Notes (Signed)
 Pt to ED via POV from home. Pt reports throat swelling and pain since 3/28. Pt dx tonsillitis on 3/28 and placed on antibiotics and steroids. Pt reports last few days feels like throat is swelling more and is hard to breath.

## 2023-11-02 NOTE — ED Provider Notes (Signed)
 [facility] Provider Note    Event Date/Time   First MD Initiated Contact with Patient 11/02/23 [phone]     (approximate)   History   Oral Swelling   HPI  Teresa Meza is a 43 y.o. female past medical history significant for diabetes, fibromyalgia, who presents to the emergency department with sore throat.  Patient states that she has been having an ongoing sore throat and feels like she is having difficulty swallowing.  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  States that she was evaluated at that time and she took her medications as prescribed.  States that since that time she has ongoing sore throat.  Does state that she has a history of an esophageal web and required dilation with Dr. Baldomero Bone approximately 1 year ago.  Pain to her jaw into her throat.  No trouble swallowing liquids or solids.  No fever.  Denies nausea or vomiting.  No falls or trauma.  Denies concern for pregnancy.  IUD in place.     Physical Exam   Triage Vital Signs: ED Triage Vitals  Encounter Vitals Group     BP 11/02/23 0851 (!) 132/93     Systolic BP Percentile --      Diastolic BP Percentile --      Pulse Rate 11/02/23 0851 90     Resp 11/02/23 0851 20     Temp --      Temp src --      SpO2 11/02/23 0851 98 %     Weight 11/02/23 0849 211 lb 13.8 oz (96.1 kg)     Height 11/02/23 0849 5\' 3"  (1.6 m)     Head Circumference --      Peak Flow --      Pain Score 11/02/23 0849 7     Pain Loc --      Pain Education --      Exclude from Growth Chart --     Most recent vital signs: Vitals:   11/02/23 0851 11/02/23 0912  BP: (!) 132/93   Pulse: 90   Resp: 20   Temp:  98.3 F (36.8 C)  SpO2: 98%     Physical Exam Constitutional:      Appearance: She is well-developed.  HENT:     Head: Atraumatic.     Mouth/Throat:     Mouth: Mucous membranes are moist.     Palate: No mass.     Pharynx:  Oropharynx is clear. No pharyngeal swelling or posterior oropharyngeal erythema.     Tonsils: No tonsillar exudate or tonsillar abscesses. 2+ on the right. 2+ on the left.  Eyes:     Conjunctiva/sclera: Conjunctivae normal.  Cardiovascular:     Rate and Rhythm: Regular rhythm.  Pulmonary:     Effort: No respiratory distress.  Abdominal:     General: There is no distension.  Musculoskeletal:        General: Normal range of motion.     Cervical back: Normal range of motion.  Skin:    General: Skin is warm.  Neurological:     Mental Status: She is alert. Mental status is at baseline.  Psychiatric:        Mood and Affect: Mood normal.     IMPRESSION / MDM / ASSESSMENT AND PLAN / ED COURSE  I reviewed the triage vital signs and the nursing notes.  Differential diagnosis  including PTA, RPA, strep throat, viral pharyngitis, esophageal web.  EKG  I, Viviano Ground, the attending physician, personally viewed and interpreted this ECG.   Rate: Normal  Rhythm: Normal sinus  Axis: Normal  Intervals: Normal  ST&T Change: None  No tachycardic or bradycardic dysrhythmias while on cardiac telemetry.  RADIOLOGY I independently reviewed imaging, my interpretation of imaging: CT soft tissue neck -no signs of an abscess.  Read as no acute findings.  LABS (all labs ordered are listed, but only abnormal results are displayed) Labs interpreted as -    Labs Reviewed  CBC - Abnormal; Notable for the following components:      Result Value   RBC 5.21 (*)    All other components within normal limits  BASIC METABOLIC PANEL WITH GFR - Abnormal; Notable for the following components:   Sodium 134 (*)    Glucose, Bld 170 (*)    Calcium 8.7 (*)    All other components within normal limits  GROUP A STREP BY PCR  POC URINE PREG, ED     MDM  Given IV ketorolac for pain control.  On chart review patient was treated with a course of steroids and Augmentin.  No leukocytosis here.  Creatinine  appears to be at her baseline.  No significant electrolyte abnormality.  Strep testing was negative.  Pregnancy test was negative.  CT scan without acute findings.  On reevaluation patient states she is feeling much better.  Swallowing secretions no signs of respiratory distress.  No concern for an allergic reaction.  No obvious findings of an RPA or PTA.  Patient completed of course of Augmentin.  Has a follow-up appointment with ENT.  Do not see an obvious infectious cause we will hold on any further antibiotics at this time.  Discussed return precautions to the emergency department for any return or worsening symptoms.     PROCEDURES:  Critical Care performed: No  Procedures  Patient's presentation is most consistent with acute presentation with potential threat to life or bodily function.   MEDICATIONS ORDERED IN ED: Medications  dexamethasone (DECADRON) injection 10 mg (has no administration in time range)  ketorolac (TORADOL) 15 MG/ML injection 15 mg (15 mg Intravenous Given 11/02/23 0934)  iohexol (OMNIPAQUE) 300 MG/ML solution 100 mL (75 mLs Intravenous Contrast Given 11/02/23 0952)    FINAL CLINICAL IMPRESSION(S) / ED DIAGNOSES   Final diagnoses:  Sore throat     Rx / DC Orders   ED Discharge Orders     None        Note:  This document was prepared using Dragon voice recognition software and may include unintentional dictation errors.   Viviano Ground, MD 11/02/23 1058

## 2023-11-02 NOTE — Discharge Instructions (Signed)
 You are seen in the emergency department for ongoing sore throat.  Your strep test was negative.  You are to CT scan soft tissue neck that did not show any abnormalities.  You are given a dose of IV Decadron which is a steroid.  You can alternate Motrin and Tylenol for pain control.  Stay hydrated and drink plenty of fluids.  Follow-up with ear nose and throat specialist at your scheduled appointment on Wednesday.  Return for any worsening symptoms.  Pain control:  Ibuprofen (motrin/aleve/advil) - You can take 3 tablets (600 mg) every 6 hours as needed for pain/fever.  Acetaminophen (tylenol) - You can take 2 extra strength tablets (1000 mg) every 6 hours as needed for pain/fever.  You can alternate these medications or take them together.  Make sure you eat food/drink water when taking these medications.

## 2023-11-18 ENCOUNTER — Other Ambulatory Visit: Payer: Self-pay | Admitting: Family

## 2023-11-18 DIAGNOSIS — E559 Vitamin D deficiency, unspecified: Secondary | ICD-10-CM

## 2023-11-18 DIAGNOSIS — M797 Fibromyalgia: Secondary | ICD-10-CM

## 2023-11-18 DIAGNOSIS — M5412 Radiculopathy, cervical region: Secondary | ICD-10-CM

## 2023-11-18 DIAGNOSIS — G8929 Other chronic pain: Secondary | ICD-10-CM

## 2024-02-07 ENCOUNTER — Other Ambulatory Visit: Payer: Self-pay | Admitting: Family

## 2024-02-07 ENCOUNTER — Other Ambulatory Visit: Payer: Self-pay | Admitting: Nurse Practitioner

## 2024-02-07 DIAGNOSIS — R809 Proteinuria, unspecified: Secondary | ICD-10-CM

## 2024-02-07 DIAGNOSIS — J4520 Mild intermittent asthma, uncomplicated: Secondary | ICD-10-CM

## 2024-02-11 ENCOUNTER — Encounter: Payer: Medicaid Other | Admitting: Family

## 2024-02-12 ENCOUNTER — Other Ambulatory Visit: Payer: Self-pay | Admitting: Family

## 2024-02-12 DIAGNOSIS — J4520 Mild intermittent asthma, uncomplicated: Secondary | ICD-10-CM

## 2024-04-12 ENCOUNTER — Emergency Department

## 2024-04-12 ENCOUNTER — Emergency Department
Admission: EM | Admit: 2024-04-12 | Discharge: 2024-04-12 | Disposition: A | Discharge status: Home / Self Care | Attending: Emergency Medicine | Admitting: Emergency Medicine

## 2024-04-12 ENCOUNTER — Other Ambulatory Visit: Payer: Self-pay

## 2024-04-12 DIAGNOSIS — I10 Essential (primary) hypertension: Secondary | ICD-10-CM | POA: Diagnosis not present

## 2024-04-12 DIAGNOSIS — R519 Headache, unspecified: Secondary | ICD-10-CM | POA: Diagnosis present

## 2024-04-12 DIAGNOSIS — E119 Type 2 diabetes mellitus without complications: Secondary | ICD-10-CM | POA: Insufficient documentation

## 2024-04-12 DIAGNOSIS — R531 Weakness: Secondary | ICD-10-CM | POA: Insufficient documentation

## 2024-04-12 DIAGNOSIS — G8929 Other chronic pain: Secondary | ICD-10-CM | POA: Insufficient documentation

## 2024-04-12 DIAGNOSIS — R6884 Jaw pain: Secondary | ICD-10-CM | POA: Diagnosis not present

## 2024-04-12 LAB — CBC
HCT: 44.4 % (ref 36.0–46.0)
Hemoglobin: 14.4 g/dL (ref 12.0–15.0)
MCH: 28 pg (ref 26.0–34.0)
MCHC: 32.4 g/dL (ref 30.0–36.0)
MCV: 86.2 fL (ref 80.0–100.0)
Platelets: 280 K/uL (ref 150–400)
RBC: 5.15 MIL/uL — ABNORMAL HIGH (ref 3.87–5.11)
RDW: 12.7 % (ref 11.5–15.5)
WBC: 5.6 K/uL (ref 4.0–10.5)
nRBC: 0 % (ref 0.0–0.2)

## 2024-04-12 LAB — COMPREHENSIVE METABOLIC PANEL WITH GFR
ALT: 10 U/L (ref 0–44)
AST: 18 U/L (ref 15–41)
Albumin: 3.6 g/dL (ref 3.5–5.0)
Alkaline Phosphatase: 49 U/L (ref 38–126)
Anion gap: 10 (ref 5–15)
BUN: 8 mg/dL (ref 6–20)
CO2: 23 mmol/L (ref 22–32)
Calcium: 8.7 mg/dL — ABNORMAL LOW (ref 8.9–10.3)
Chloride: 103 mmol/L (ref 98–111)
Creatinine, Ser: 0.87 mg/dL (ref 0.44–1.00)
GFR, Estimated: >60 mL/min (ref >60)
Glucose, Bld: 137 mg/dL — ABNORMAL HIGH (ref 70–99)
Potassium: 3.9 mmol/L (ref 3.5–5.1)
Sodium: 136 mmol/L (ref 135–145)
Total Bilirubin: 1.1 mg/dL (ref 0.0–1.2)
Total Protein: 7.3 g/dL (ref 6.5–8.1)

## 2024-04-12 LAB — URINALYSIS, ROUTINE W REFLEX MICROSCOPIC
Bilirubin Urine: NEGATIVE
Glucose, UA: NEGATIVE mg/dL
Ketones, ur: NEGATIVE mg/dL
Nitrite: NEGATIVE
Protein, ur: 30 mg/dL — AB
Specific Gravity, Urine: 1.024 (ref 1.005–1.030)
pH: 5 (ref 5.0–8.0)

## 2024-04-12 LAB — RESP PANEL BY RT-PCR (RSV, FLU A&B, COVID)  RVPGX2
Influenza A by PCR: NEGATIVE
Influenza B by PCR: NEGATIVE
Resp Syncytial Virus by PCR: NEGATIVE
SARS Coronavirus 2 by RT PCR: NEGATIVE

## 2024-04-12 LAB — TSH: TSH: 2.394 u[IU]/mL (ref 0.350–4.500)

## 2024-04-12 LAB — TROPONIN I (HIGH SENSITIVITY)
Troponin I (High Sensitivity): <2 ng/L (ref <18)
Troponin I (High Sensitivity): 4 ng/L (ref <18)

## 2024-04-12 LAB — PREGNANCY, URINE: Preg Test, Ur: NEGATIVE

## 2024-04-12 MED ORDER — IBUPROFEN 600 MG PO TABS: 600.0000 mg | ORAL_TABLET | Freq: Three times a day (TID) | ORAL | 0 refills | Status: AC | PRN

## 2024-04-12 MED ORDER — MIDAZOLAM HCL 2 MG/2ML IJ SOLN
0.5000 mg | Freq: Once | INTRAMUSCULAR | Status: AC
  Administered 2024-04-12: 0.5 mg via INTRAVENOUS
  Filled 2024-04-12: qty 2

## 2024-04-12 MED ORDER — ONDANSETRON HCL 4 MG/2ML IJ SOLN
4.0000 mg | Freq: Once | INTRAMUSCULAR | Status: AC
  Administered 2024-04-12: 4 mg via INTRAVENOUS
  Filled 2024-04-12: qty 2

## 2024-04-12 MED ORDER — SODIUM CHLORIDE 0.9 % IV BOLUS
500.0000 mL | Freq: Once | INTRAVENOUS | Status: AC
  Administered 2024-04-12: 500 mL via INTRAVENOUS

## 2024-04-12 MED ORDER — ONDANSETRON 4 MG PO TBDP: 4.0000 mg | ORAL_TABLET | Freq: Three times a day (TID) | ORAL | 0 refills | Status: AC | PRN

## 2024-04-12 MED ORDER — LIDOCAINE 5 % EX PTCH: 1.0000 | MEDICATED_PATCH | CUTANEOUS | 0 refills | Status: AC

## 2024-04-12 MED ORDER — HYDROMORPHONE HCL 1 MG/ML IJ SOLN
0.5000 mg | Freq: Once | INTRAMUSCULAR | Status: AC
  Administered 2024-04-12: 0.5 mg via INTRAVENOUS
  Filled 2024-04-12: qty 0.5

## 2024-04-12 MED ORDER — HYDROMORPHONE HCL 1 MG/ML IJ SOLN: 1.0000 mg | Freq: Once | INTRAMUSCULAR | Status: DC

## 2024-04-12 NOTE — Discharge Instructions (Addendum)
 You should return to the ER if you develop changes in your symptoms. Do not see signs of heart attack, severe dehydration, bleeding in the brain or other acute pathology. We have started you on some Zofran  to help with any nausea.  Possible this could be a viral illness or could be from some dehydration show please drink plenty of fluids including Gatorade, Pedialyte. For your pain you can take Tylenol  1 g every 8 hours and the ibuprofen  for 1 week.  Use the pain patches to help as well. Please call your neurologist, neurosurgeon to make follow-up appointments.

## 2024-04-12 NOTE — ED Triage Notes (Signed)
 Pt to Ed via POV from home. Pt reports generalized weakness for the last few days. Pt reports tingling to both arms, right leg pain, nausea, and HA.

## 2024-04-12 NOTE — ED Provider Notes (Signed)
 [facility] Provider Note    Event Date/Time   First MD Initiated Contact with Patient 04/12/24 [phone]     (approximate)   History   Weakness   HPI  Teresa Meza is a 43 y.o. female with history of fibromyalgia, diabetes, HTN, depression who comes in with concerns for weakness.  Patient reports a history of chronic pain.  She reports at baseline she has got right sided weakness secondary to pain has been an ongoing issue for 2 to 3 years.  She reports that she has been followed by neurology, neurosurgery for this.  She denies that this is new in nature she just reports now just increasing generalized weakness and feeling a whole tingling sensation/reverberation over her entire body and feeling anxious.  She does report some associated nausea and mild headache. Not severe or sudden in onset.  She reports that the pain on her right side is chronic.  This is led to chronic weakness.  She reports that she is currently on medications for this, gabapentin .  She stopped going to the pain clinic secondary to it not really helping so she is no longer on any narcotics.  She denies any alcohol, drugs.  She does report history of depression but he is she denies any SI or HI.  She denies any new falls.  No chest pain, abdominal pain.  She does report a little bit of pain along her right jaw but no pain in her teeth.   Physical Exam   Triage Vital Signs: ED Triage Vitals [04/12/24 0736]  Encounter Vitals Group     BP      Girls Systolic BP Percentile      Girls Diastolic BP Percentile      Boys Systolic BP Percentile      Boys Diastolic BP Percentile      Pulse      Resp      Temp      Temp src      SpO2      Weight      Height      Head Circumference      Peak Flow      Pain Score 8     Pain Loc      Pain Education      Exclude from Growth Chart     Most recent vital signs: Vitals:   04/12/24 0738  BP: (!) 124/93  Pulse: 88  Resp: 18  Temp: 98.1 F  (36.7 C)  SpO2: 98%     General: Awake, no distress.  CV:  Good peripheral perfusion.  Resp:  Normal effort.  Abd:  No distention.  Soft and nontender Other:  No swelling noted to her face.  No evidence of tooth abscess.  Good dentition- no pain along her teeth.  Cranial nerves appear intact.  She does have right-sided weakness that she reports is baseline for the past 2 to 3 years secondary to her chronic pain.  She also reports a decrease sensation on the right is at baseline for the past 2 or 3 years.   ED Results / Procedures / Treatments   Labs (all labs ordered are listed, but only abnormal results are displayed) Labs Reviewed  COMPREHENSIVE METABOLIC PANEL WITH GFR - Abnormal; Notable for the following components:      Result Value   Glucose, Bld 137 (*)    Calcium 8.7 (*)    All other components within normal limits  CBC -  Abnormal; Notable for the following components:   RBC 5.15 (*)    All other components within normal limits  URINALYSIS, ROUTINE W REFLEX MICROSCOPIC - Abnormal; Notable for the following components:   Color, Urine YELLOW (*)    APPearance CLOUDY (*)    Hgb urine dipstick SMALL (*)    Protein, ur 30 (*)    Leukocytes,Ua TRACE (*)    Bacteria, UA FEW (*)    All other components within normal limits  RESP PANEL BY RT-PCR (RSV, FLU A&B, COVID)  RVPGX2  PREGNANCY, URINE  TSH  CBG MONITORING, ED  TROPONIN I (HIGH SENSITIVITY)  TROPONIN I (HIGH SENSITIVITY)     EKG  My interpretation of EKG:  Normal sinus rhythm 90 without any ST elevation or T wave inversions except for lead III, normal intervals.  Similar to prior EKG  RADIOLOGY I have reviewed the ct personally and interpreted no evidence of intracranial hemorrhage   PROCEDURES:  Critical Care performed: No  .1-3 Lead EKG Interpretation  Performed by: Ernest Ronal BRAVO, MD Authorized by: Ernest Ronal BRAVO, MD     Interpretation: normal     ECG rate:  80   ECG rate assessment: normal      Rhythm: sinus rhythm     Ectopy: none     Conduction: normal      MEDICATIONS ORDERED IN ED: Medications  midazolam  (VERSED ) injection 0.5 mg (0.5 mg Intravenous Given 04/12/24 0835)  ondansetron  (ZOFRAN ) injection 4 mg (4 mg Intravenous Given 04/12/24 0835)  sodium chloride  0.9 % bolus 500 mL (0 mLs Intravenous Stopped 04/12/24 0904)  HYDROmorphone  (DILAUDID ) injection 0.5 mg (0.5 mg Intravenous Given 04/12/24 0904)     IMPRESSION / MDM / ASSESSMENT AND PLAN / ED COURSE  I reviewed the triage vital signs and the nursing notes.   Patient's presentation is most consistent with acute presentation with potential threat to life or bodily function.   Patient comes in with concerns for generalized body weakness.  CT imaging ordered to make sure no evidence of intracranial hemorrhage, intracranial mass given is hard to get a good neuroexam secondary to her baseline right sided weakness secondary to pain.  Patient does report some anxiety so patient will trial on some Versed .  Will check labs to evaluate for Electra abnormalities, AKI, UTI, COVID, flu.  I reviewed the notes where I saw patient back in 2023 where I did document right sided arm and leg weakness secondary to pain  After medications patient now requesting something for pain.  Patient was given 1 dose of pain medications.  This is for her chronic pain.  Pregnancy test was negative.  CMP was reassuring CBC reassuring urine with significant squamous cells in it and she really denies any urinary symptoms offered to do a new sample but patient declined.  Troponin negative.  COVID flu are negative CT imaging is negative.  Reevaluated patient we discussed symptoms and she reports that the reverberating that she felt in her body feels improved.  She continues to have her known chronic right sided weakness, numbness but this is not new and has been ongoing for 2 to 3 years to no indication for emergent MRI.  Her weakness was not worsening of  just that right side it was of the entire body and more generalized weakness.  I low suspicion for stroke and do not feel like MRI is indicated given her neurodeficits are at baseline.  She then reports these waves of hot and cold flashes.  We discussed getting a thyroid  level to ensure her thyroid  was okay this will not delay disposition given she has no evidence of thyroid  storm or myxedema coma.  She can follow this up with her primary care doctor.  We also discussed the possibility of starting perimenopause, menopause.  She does have an IUD in place but her abdomen is soft and nontender and denies any abdominal pain to suggest misplacement of this.  On repeat evaluation patient feels much improved and she states that she feels comfortable with discharge home.  She will follow-up with her primary care doctor and her neurologist.  She did request something for her pain.  Given this is her chronic pain explained to her that we could not do any narcotics that she would need to follow-up at the pain clinic.  We discussed ibuprofen , lidocaine  patches and continuing her gabapentin .  She expressed understanding and felt comfortable with that plan   The patient is on the cardiac monitor to evaluate for evidence of arrhythmia and/or significant heart rate changes.      FINAL CLINICAL IMPRESSION(S) / ED DIAGNOSES   Final diagnoses:  Weakness  Other chronic pain     Rx / DC Orders   ED Discharge Orders          Ordered    ibuprofen  (ADVIL ) 600 MG tablet  Every 8 hours PRN        04/12/24 1000    ondansetron  (ZOFRAN -ODT) 4 MG disintegrating tablet  Every 8 hours PRN        04/12/24 1000    lidocaine  (LIDODERM ) 5 %  Every 24 hours        04/12/24 1000             Note:  This document was prepared using Dragon voice recognition software and may include unintentional dictation errors.   Ernest Ronal BRAVO, MD 04/12/24 1002

## 2024-05-11 ENCOUNTER — Telehealth: Payer: Self-pay | Admitting: Family

## 2024-05-11 NOTE — Telephone Encounter (Signed)
 Copied from CRM [id number]. Topic: Referral - Question >> May 10, 2024  4:15 PM Thersia BROCKS wrote: Reason for CRM: Patient called in stated she wanted to make NP Ginger Patrick aware that she is trying to be scheduled for a GI appointment , wanted to make her aware so she may can be seen sooner

## 2024-05-11 NOTE — Telephone Encounter (Signed)
 NOTED Unfortunately she has never mentioned constipation and we have never assessed this during our visits. In order to consider expedition of a referral we'd have to have an office visit.

## 2024-05-11 NOTE — Telephone Encounter (Signed)
 Spoke with pt and she just wanted Tabitha know that she has had to make an appointment with GI due her being chronically constipated.

## 2024-05-12 ENCOUNTER — Encounter: Payer: Self-pay | Admitting: Gastroenterology

## 2024-05-12 NOTE — Anesthesia Preprocedure Evaluation (Addendum)
 Anesthesia Evaluation  Patient identified by MRN, date of birth, ID band Patient awake    Reviewed: Allergy & Precautions, H&P , NPO status , Patient's Chart, lab work & pertinent test results  Airway Mallampati: II  TM Distance: >3 FB Neck ROM: Full    Dental no notable dental hx.    Pulmonary neg pulmonary ROS, asthma    Pulmonary exam normal breath sounds clear to auscultation       Cardiovascular hypertension, negative cardio ROS Normal cardiovascular exam Rhythm:Regular Rate:Normal     Neuro/Psych  PSYCHIATRIC DISORDERS  Depression     Neuromuscular disease negative neurological ROS  negative psych ROS   GI/Hepatic negative GI ROS, Neg liver ROS,,,  Endo/Other  negative endocrine ROSdiabetes    Renal/GU negative Renal ROS  negative genitourinary   Musculoskeletal negative musculoskeletal ROS (+) Arthritis ,  Fibromyalgia -  Abdominal   Peds negative pediatric ROS (+)  Hematology negative hematology ROS (+)   Anesthesia Other Findings Elevated blood pressure reading without diagnosis of hypertension Preeclampsia, third trimester Gestational diabetes mellitus (GDM) affecting fourth pregnancy Obesity Vitamin D  deficiency Fibromyalgia  Pre-diabetes Arthritis  Mild intermittent reactive airway disease Chronic idiopathic pain syndrome     Reproductive/Obstetrics negative OB ROS                              Anesthesia Physical Anesthesia Plan  ASA: 3  Anesthesia Plan: General   Post-op Pain Management:    Induction: Intravenous  PONV Risk Score and Plan:   Airway Management Planned: Natural Airway and Nasal Cannula  Additional Equipment:   Intra-op Plan:   Post-operative Plan:   Informed Consent: I have reviewed the patients History and Physical, chart, labs and discussed the procedure including the risks, benefits and alternatives for the proposed anesthesia with the  patient or authorized representative who has indicated his/her understanding and acceptance.     Dental Advisory Given  Plan Discussed with: Anesthesiologist, CRNA and Surgeon  Anesthesia Plan Comments: (Patient consented for risks of anesthesia including but not limited to:  - adverse reactions to medications - risk of airway placement if required - damage to eyes, teeth, lips or other oral mucosa - nerve damage due to positioning  - sore throat or hoarseness - Damage to heart, brain, nerves, lungs, other parts of body or loss of life  Patient voiced understanding and assent.)         Anesthesia Quick Evaluation

## 2024-05-18 ENCOUNTER — Ambulatory Visit: Payer: Self-pay | Admitting: Anesthesiology

## 2024-05-18 ENCOUNTER — Ambulatory Visit
Admission: RE | Admit: 2024-05-18 | Discharge: 2024-05-18 | Disposition: A | Discharge status: Home / Self Care | Attending: Gastroenterology | Admitting: Gastroenterology

## 2024-05-18 ENCOUNTER — Encounter
Admission: RE | Disposition: A | Discharge status: Home / Self Care | Payer: Self-pay | Source: Home / Self Care | Attending: Gastroenterology

## 2024-05-18 ENCOUNTER — Encounter: Payer: Self-pay | Admitting: Gastroenterology

## 2024-05-18 ENCOUNTER — Other Ambulatory Visit: Payer: Self-pay

## 2024-05-18 DIAGNOSIS — R194 Change in bowel habit: Secondary | ICD-10-CM | POA: Insufficient documentation

## 2024-05-18 DIAGNOSIS — J45909 Unspecified asthma, uncomplicated: Secondary | ICD-10-CM | POA: Diagnosis not present

## 2024-05-18 DIAGNOSIS — K626 Ulcer of anus and rectum: Secondary | ICD-10-CM | POA: Insufficient documentation

## 2024-05-18 DIAGNOSIS — K6289 Other specified diseases of anus and rectum: Secondary | ICD-10-CM | POA: Diagnosis not present

## 2024-05-18 DIAGNOSIS — M797 Fibromyalgia: Secondary | ICD-10-CM | POA: Diagnosis not present

## 2024-05-18 DIAGNOSIS — M1990 Unspecified osteoarthritis, unspecified site: Secondary | ICD-10-CM | POA: Insufficient documentation

## 2024-05-18 HISTORY — DX: Prediabetes: R73.03

## 2024-05-18 HISTORY — DX: Other chronic pain: G89.29

## 2024-05-18 HISTORY — DX: Fibromyalgia: M79.7

## 2024-05-18 HISTORY — PX: COLONOSCOPY: SHX5424

## 2024-05-18 HISTORY — DX: Mild intermittent asthma, uncomplicated: J45.20

## 2024-05-18 HISTORY — DX: Unspecified osteoarthritis, unspecified site: M19.90

## 2024-05-18 LAB — POCT PREGNANCY, URINE: Preg Test, Ur: NEGATIVE

## 2024-05-18 SURGERY — COLONOSCOPY
Anesthesia: General

## 2024-05-18 MED ORDER — LACTATED RINGERS IV SOLN
INTRAVENOUS | Status: DC
Start: 2024-05-18 — End: 2024-05-18

## 2024-05-18 MED ORDER — PROPOFOL 10 MG/ML IV BOLUS
INTRAVENOUS | Status: DC | PRN
  Administered 2024-05-18: 80 mg via INTRAVENOUS

## 2024-05-18 MED ORDER — LIDOCAINE HCL (CARDIAC) PF 100 MG/5ML IV SOSY
PREFILLED_SYRINGE | INTRAVENOUS | Status: DC | PRN
  Administered 2024-05-18: 40 mg via INTRAVENOUS

## 2024-05-18 MED ORDER — PROPOFOL 10 MG/ML IV BOLUS
INTRAVENOUS | Status: AC
  Filled 2024-05-18: qty 40

## 2024-05-18 MED ORDER — STERILE WATER FOR IRRIGATION IR SOLN
Status: DC | PRN
  Administered 2024-05-18: 1

## 2024-05-18 MED ORDER — PROPOFOL 500 MG/50ML IV EMUL
INTRAVENOUS | Status: DC | PRN
  Administered 2024-05-18: 125 ug/kg/min via INTRAVENOUS

## 2024-05-18 MED ORDER — STERILE WATER FOR IRRIGATION IR SOLN
Status: DC | PRN
  Administered 2024-05-18: 120 mL

## 2024-05-18 SURGICAL SUPPLY — 6 items
FORCEPS BIOP RAD 4 LRG CAP 4 (CUTTING FORCEPS) IMPLANT
GAUZE SPONGE 4X4 12PLY STRL (GAUZE/BANDAGES/DRESSINGS) IMPLANT
GOWN CVR UNV OPN BCK APRN NK (MISCELLANEOUS) ×2 IMPLANT
KIT PROCEDURE OLYMPUS (MISCELLANEOUS) ×1 IMPLANT
MANIFOLD NEPTUNE II (INSTRUMENTS) ×1 IMPLANT
WATER STERILE IRR 250ML POUR (IV SOLUTION) ×1 IMPLANT

## 2024-05-18 NOTE — Transfer of Care (Signed)
 Immediate Anesthesia Transfer of Care Note  Patient: Teresa Meza  Procedure(s) Performed: COLONOSCOPY  Patient Location: PACU  Anesthesia Type: General  Level of Consciousness: awake, alert  and patient cooperative  Airway and Oxygen Therapy: Patient Spontanous Breathing and Patient connected to supplemental oxygen  Post-op Assessment: Post-op Vital signs reviewed, Patient's Cardiovascular Status Stable, Respiratory Function Stable, Patent Airway and No signs of Nausea or vomiting  Post-op Vital Signs: Reviewed and stable  Complications: No notable events documented.

## 2024-05-18 NOTE — Op Note (Signed)
 [facility] Patient Name: Teresa Meza Procedure Date: 05/18/2024 11:13 AM MRN: [id number] Account #: [id number] Date of Birth: [birth year] Admit Type: Outpatient Age: 43 Room: [facility] OR ROOM 01 Gender: Female Note Status: Finalized Instrument Name: Colonoscope [id number] Procedure:             Colonoscopy Indications:           This is the patient's first colonoscopy, , Change in                         bowel habits, rectal pain Providers:             Corinn Jess Brooklyn MD, MD Referring MD:          Ginger Patrick (Referring MD) Medicines:             General Anesthesia Complications:         No immediate complications. Estimated blood loss: None. Procedure:             Pre-Anesthesia Assessment:                        - Prior to the procedure, a History and Physical was                         performed, and patient medications and allergies were                         reviewed. The patient is competent. The risks and                         benefits of the procedure and the sedation options and                         risks were discussed with the patient. All questions                         were answered and informed consent was obtained.                         Patient identification and proposed procedure were                         verified by the physician, the nurse, the                         anesthesiologist, the anesthetist and the technician                         in the pre-procedure area in the procedure room in the                         endoscopy suite. Mental Status Examination: alert and                         oriented. Airway Examination: normal oropharyngeal                         airway and neck mobility. Respiratory Examination:  clear to auscultation. CV Examination: normal.                         Prophylactic Antibiotics: The patient does not require                         prophylactic  antibiotics. Prior Anticoagulants: The                         patient has taken no anticoagulant or antiplatelet                         agents. ASA Grade Assessment: III - A patient with                         severe systemic disease. After reviewing the risks and                         benefits, the patient was deemed in satisfactory                         condition to undergo the procedure. The anesthesia                         plan was to use general anesthesia. Immediately prior                         to administration of medications, the patient was                         re-assessed for adequacy to receive sedatives. The                         heart rate, respiratory rate, oxygen saturations,                         blood pressure, adequacy of pulmonary ventilation, and                         response to care were monitored throughout the                         procedure. The physical status of the patient was                         re-assessed after the procedure.                        After obtaining informed consent, the colonoscope was                         passed under direct vision. Throughout the procedure,                         the patient's blood pressure, pulse, and oxygen                         saturations were monitored continuously. The  Colonoscope was introduced through the anus and                         advanced to the the terminal ileum, with                         identification of the appendiceal orifice and IC                         valve. The colonoscopy was performed without                         difficulty. The patient tolerated the procedure well.                         The quality of the bowel preparation was evaluated                         using the BBPS [facility] Bowel Preparation Scale) with                         scores of: Right Colon = 3, Transverse Colon = 3 and                         Left Colon = 3  (entire mucosa seen well with no                         residual staining, small fragments of stool or opaque                         liquid). The total BBPS score equals 9. The terminal                         ileum, ileocecal valve, appendiceal orifice, and                         rectum were photographed. Findings:      The perianal and digital rectal examinations were normal. Pertinent       negatives include normal sphincter tone and no palpable rectal lesions.      The terminal ileum appeared normal.      A single localized non-bleeding erosion was found in the distal rectum.       No stigmata of recent bleeding were seen. Biopsies were taken with a       cold forceps for histology.      A patchy area of mildly erythematous mucosa was found in the distal       rectum. Biopsies were taken with a cold forceps for histology.      No additional abnormalities were found on retroflexion.      The exam was otherwise without abnormality. Impression:            - The examined portion of the ileum was normal.                        - A single erosion in the distal rectum. Biopsied.                        -  Erythematous mucosa in the distal rectum. Biopsied.                        - The examination was otherwise normal. Recommendation:        - Discharge patient to home (with spouse).                        - Resume previous diet today.                        - Continue present medications.                        - Await pathology results.                        - Repeat colonoscopy in 10 years for screening                         purposes. Procedure Code(s):     --- Professional ---                        [phone], Colonoscopy, flexible; with biopsy, single or                         multiple Diagnosis Code(s):     --- Professional ---                        K62.6, Ulcer of anus and rectum                        K62.89, Other specified diseases of anus and rectum                         R19.4, Change in bowel habit CPT copyright 2022 [employer]. All rights reserved. The codes documented in this report are preliminary and upon coder review may  be revised to meet current compliance requirements. Dr. Corinn Brooklyn Corinn Jess Brooklyn MD, MD 05/18/2024 11:43:15 AM This report has been signed electronically. Number of Addenda: 0 Note Initiated On: 05/18/2024 11:13 AM Scope Withdrawal Time: 0 hours 9 minutes 45 seconds  Total Procedure Duration: 0 hours 12 minutes 45 seconds  Estimated Blood Loss:  Estimated blood loss: none.      [facility]

## 2024-05-18 NOTE — Anesthesia Postprocedure Evaluation (Signed)
 Anesthesia Post Note  Patient: Teresa Meza Cluster  Procedure(s) Performed: COLONOSCOPY  Patient location during evaluation: PACU Anesthesia Type: General Level of consciousness: awake and alert Pain management: pain level controlled Vital Signs Assessment: post-procedure vital signs reviewed and stable Respiratory status: spontaneous breathing, nonlabored ventilation, respiratory function stable and patient connected to nasal cannula oxygen Cardiovascular status: blood pressure returned to baseline and stable Postop Assessment: no apparent nausea or vomiting Anesthetic complications: no   No notable events documented.   Last Vitals:  Vitals:   05/18/24 1016 05/18/24 1146  BP: (!) 128/100 114/75  Pulse: 96 91  Resp: 14 (!) 28  Temp: (!) 36.2 C (!) 36.3 C  SpO2: 97% 98%    Last Pain:  Vitals:   05/18/24 1146  TempSrc:   PainSc: Asleep                 Jedi Catalfamo C Keaghan Bowens

## 2024-05-18 NOTE — H&P (Signed)
 Corinn JONELLE Brooklyn, MD [facility], [facility] [address]  [place], [place] [address]  Main: [phone] Fax:  [phone] Pager: [phone]   Primary Care Physician:  Corwin Antu, FNP Primary Gastroenterologist:  Dr. Corinn JONELLE Brooklyn  Pre-Procedure History & Physical: HPI:  Teresa Meza is a 43 y.o. female is here for an colonoscopy.   Past Medical History:  Diagnosis Date   Advanced maternal age in multigravida, unspecified trimester 10/23/2017   Arthritis    low back pain   Chronic idiopathic pain syndrome    Ectopic fetus    Elderly multigravida 08/26/2017   [ ]  NIPT [ ]  NST/AFI weekly starting at 36 weeks   Elevated blood pressure reading without diagnosis of hypertension 02/10/2018   Fibromyalgia    Gestational diabetes mellitus (GDM) affecting fourth pregnancy 10/14/2017   Hx of gestational diabetes in prior pregnancy, currently pregnant 08/26/2017   [ ]  early 1GTT   Indication for care in labor and delivery, antepartum 01/24/2018   Labor and delivery indication for care or intervention 10/24/2017   Mild intermittent reactive airway disease    Obesity in pregnancy 10/08/2017   Pre-diabetes    Preeclampsia, third trimester 02/10/2018   Supervision of high risk pregnancy, antepartum 08/26/2017     [facility]renatal [facility]  Dating  6wk US  Blood type: --/--/B POS  Genetic Screen  NIPS: Normal XY   Antibody:Negative (02/05 1516)  Anatomic US   Rubella: 4.25 (02/05 1516) Varicella: Immune  GTT Early: 1hr:189       28 wk:      RPR: Non Reactive (02/05 1516)   Rhogam  Not needed HBsAg: Negative (02/05 1516)   TDaP vaccine                       HIV: Non Reactive (02/05 1516)   Flu Shot   Dec   Vitamin D  deficiency     Past Surgical History:  Procedure Laterality Date   ECTOPIC PREGNANCY SURGERY     ESOPHAGOGASTRODUODENOSCOPY (EGD) WITH PROPOFOL  N/A 08/07/2021   Procedure: ESOPHAGOGASTRODUODENOSCOPY (EGD) WITH PROPOFOL ;  Surgeon: Brooklyn Corinn Skiff, MD;  Location: [facility] ENDOSCOPY;  Service: Gastroenterology;  Laterality: N/A;    Prior to Admission medications   Medication Sig Start Date End Date Taking? Authorizing Provider  ANORO ELLIPTA  62.5-25 MCG/ACT AEPB INHALE 1 PUFF BY MOUTH ONCE DAILY AT  6AM  (NEEDS  APPOINTMENT  PRIOR  TO  NEXT  REFILL) 02/16/24  Yes Dugal, Tabitha, FNP  CAPLYTA 42 MG capsule Take 42 mg by mouth at bedtime.   Yes [provider]  Cholecalciferol  1.25 MG (50000 UT) TABS Take 1 tablet by mouth once a week. 12/26/22  Yes Dugal, Antu, FNP  clonazePAM (KLONOPIN) 0.5 MG tablet Take 0.5 mg by mouth 2 (two) times daily.   Yes [provider]  cyanocobalamin  (VITAMIN B12) 1000 MCG tablet Take 1,000 mcg by mouth daily.   Yes [provider]  DULoxetine (CYMBALTA) 30 MG capsule Take 30 mg by mouth daily.   Yes [provider]  gabapentin  (NEURONTIN ) 300 MG capsule TAKE 1 CAPSULE BY MOUTH THREE TIMES DAILY 11/18/23  Yes Dugal, Tabitha, FNP  HYDROcodone -acetaminophen  (NORCO/VICODIN) 5-325 MG tablet Take 1 tablet by mouth every 4 (four) hours as needed. 02/05/23  Yes Dorothyann Drivers, MD  levonorgestrel (MIRENA) 20 MCG/DAY IUD by Intrauterine route.   Yes [provider]  losartan  (COZAAR ) 25 MG tablet Take 1 tablet by mouth once  daily 02/09/24  Yes Dugal, Tabitha, FNP  meloxicam  (MOBIC ) 15 MG tablet Take 1 tablet (15 mg total) by mouth daily. 12/31/22  Yes Dugal, Tabitha, FNP  pantoprazole  (PROTONIX ) 40 MG tablet Take 40 mg by mouth daily.   Yes [provider]  prazosin (MINIPRESS) 1 MG capsule Take 1 mg by mouth at bedtime.   Yes [provider]  topiramate (TOPAMAX) 50 MG tablet Take 50 mg by mouth daily. 02/10/23  Yes [provider]  metFORMIN  (GLUCOPHAGE ) 500 MG tablet Take by mouth 2 (two) times daily with a meal. Patient not taking: Reported on 05/18/2024    [provider]  predniSONE  (STERAPRED UNI-PAK 21 TAB) 10 MG (21) TBPK  tablet Take 6 tablets on the first day and decrease by 1 tablet each day until finished. Patient not taking: Reported on 05/12/2024 10/17/23   Herlinda Belton B, FNP  sucralfate  (CARAFATE ) 1 g tablet Take 1 tablet (1 g total) by mouth 4 (four) times daily for 15 days. 02/05/23 03/19/23  Dorothyann Drivers, MD    Allergies as of 05/11/2024 - Review Complete 04/12/2024  Allergen Reaction Noted   Sulfa antibiotics Hives, Itching, and Nausea Only 11/22/2015   Sulfur dioxide Hives 11/01/2021    Family History  Problem Relation Age of Onset   Diabetes Mellitus II Mother    Arthritis Mother    Dementia Mother    Mental illness Mother    Bipolar disorder Father    Bipolar disorder Brother    Liver cancer Maternal Uncle    Kidney cancer Maternal Uncle     Social History   Socioeconomic History   Marital status: Married    Spouse name: Not on file   Number of children: Not on file   Years of education: Not on file   Highest education level: Not on file  Occupational History   Not on file  Tobacco Use   Smoking status: Never    Passive exposure: Never   Smokeless tobacco: Never  Vaping Use   Vaping status: Never Used  Substance and Sexual Activity   Alcohol use: No   Drug use: No   Sexual activity: Yes    Birth control/protection: I.U.D.  Other Topics Concern   Not on file  Social History Narrative   Not on file   Social Drivers of Health   Financial Resource Strain: Patient Unable To Answer (05/11/2024)   Received from [facility]   Overall Financial Resource Strain (CARDIA)    Difficulty of Paying Living Expenses: Patient unable to answer  Food Insecurity: Patient Unable To Answer (05/11/2024)   Received from [facility]   Hunger Vital Sign    Within the past 12 months, you worried that your food would run out before you got the money to buy more.: Patient unable to answer    Within the past 12 months, the food you bought just didn't  last and you didn't have money to get more.: Patient unable to answer  Transportation Needs: Patient Unable To Answer (05/11/2024)   Received from [facility] - Transportation    In the past 12 months, has lack of transportation kept you from medical appointments or from getting medications?: Patient unable to answer    Lack of Transportation (Non-Medical): Patient unable to answer  Physical Activity: Not on file  Stress: Not on file  Social Connections: Not on file  Intimate Partner Violence: Not on file  Review of Systems: See HPI, otherwise negative ROS  Physical Exam: BP (!) 128/100   Pulse 96   Temp (!) 97.1 F (36.2 C) (Temporal)   Resp 14   Ht 5' 3.5 (1.613 m)   Wt 97.1 kg   SpO2 97%   BMI 37.31 kg/m  General:   Alert,  pleasant and cooperative in NAD Head:  Normocephalic and atraumatic. Neck:  Supple; no masses or thyromegaly. Lungs:  Clear throughout to auscultation.    Heart:  Regular rate and rhythm. Abdomen:  Soft, nontender and nondistended. Normal bowel sounds, without guarding, and without rebound.   Neurologic:  Alert and  oriented x4;  grossly normal neurologically.  Impression/Plan: Teresa Meza is here for an colonoscopy to be performed for change in bowel habits and recent rectal pain   Risks, benefits, limitations, and alternatives regarding  colonoscopy have been reviewed with the patient.  Questions have been answered.  All parties agreeable.   Corinn Brooklyn, MD  05/18/2024, 10:27 AM

## 2024-05-20 ENCOUNTER — Ambulatory Visit: Payer: Self-pay | Admitting: Family

## 2024-05-20 LAB — SURGICAL PATHOLOGY

## 2024-05-20 NOTE — Progress Notes (Signed)
 noted

## 2024-06-16 ENCOUNTER — Other Ambulatory Visit: Payer: Self-pay

## 2024-06-21 ENCOUNTER — Other Ambulatory Visit: Payer: Self-pay | Admitting: Family

## 2024-06-21 DIAGNOSIS — R809 Proteinuria, unspecified: Secondary | ICD-10-CM

## 2024-06-21 DIAGNOSIS — M797 Fibromyalgia: Secondary | ICD-10-CM

## 2024-06-21 DIAGNOSIS — M5412 Radiculopathy, cervical region: Secondary | ICD-10-CM

## 2024-06-21 DIAGNOSIS — G8929 Other chronic pain: Secondary | ICD-10-CM

## 2024-06-22 ENCOUNTER — Ambulatory Visit: Admitting: Family Medicine

## 2024-08-05 ENCOUNTER — Ambulatory Visit: Admitting: Podiatry

## 2024-08-12 ENCOUNTER — Ambulatory Visit: Admitting: Podiatry

## 2024-11-05 ENCOUNTER — Encounter: Admitting: Family
